# Patient Record
Sex: Female | Born: 1940 | Race: Black or African American | Hispanic: No | Marital: Married | Smoking: Never smoker
Health system: Southern US, Community
[De-identification: ages and names within clinical notes are randomized; demographics above are authoritative.]

## PROBLEM LIST (undated history)

## (undated) DIAGNOSIS — Z9889 Other specified postprocedural states: Secondary | ICD-10-CM

## (undated) DIAGNOSIS — E119 Type 2 diabetes mellitus without complications: Secondary | ICD-10-CM

## (undated) DIAGNOSIS — G43909 Migraine, unspecified, not intractable, without status migrainosus: Secondary | ICD-10-CM

## (undated) DIAGNOSIS — M1990 Unspecified osteoarthritis, unspecified site: Secondary | ICD-10-CM

## (undated) DIAGNOSIS — F0390 Unspecified dementia without behavioral disturbance: Secondary | ICD-10-CM

## (undated) DIAGNOSIS — R0602 Shortness of breath: Secondary | ICD-10-CM

## (undated) DIAGNOSIS — I10 Essential (primary) hypertension: Secondary | ICD-10-CM

## (undated) DIAGNOSIS — I2510 Atherosclerotic heart disease of native coronary artery without angina pectoris: Secondary | ICD-10-CM

## (undated) DIAGNOSIS — H9190 Unspecified hearing loss, unspecified ear: Secondary | ICD-10-CM

## (undated) DIAGNOSIS — I219 Acute myocardial infarction, unspecified: Secondary | ICD-10-CM

## (undated) DIAGNOSIS — R413 Other amnesia: Secondary | ICD-10-CM

## (undated) DIAGNOSIS — D509 Iron deficiency anemia, unspecified: Secondary | ICD-10-CM

## (undated) DIAGNOSIS — R112 Nausea with vomiting, unspecified: Secondary | ICD-10-CM

## (undated) DIAGNOSIS — E785 Hyperlipidemia, unspecified: Secondary | ICD-10-CM

## (undated) HISTORY — PX: TUBAL LIGATION: SHX77

## (undated) HISTORY — DX: Hyperlipidemia, unspecified: E78.5

## (undated) HISTORY — DX: Atherosclerotic heart disease of native coronary artery without angina pectoris: I25.10

## (undated) HISTORY — DX: Unspecified hearing loss, unspecified ear: H91.90

## (undated) HISTORY — PX: BREAST BIOPSY: SHX20

## (undated) HISTORY — PX: REDUCTION MAMMAPLASTY: SUR839

## (undated) HISTORY — DX: Type 2 diabetes mellitus without complications: E11.9

## (undated) HISTORY — DX: Essential (primary) hypertension: I10

## (undated) HISTORY — DX: Other amnesia: R41.3

## (undated) HISTORY — PX: SHOULDER HEMI-ARTHROPLASTY: SHX5049

---
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
## 1996-02-24 DIAGNOSIS — I2510 Atherosclerotic heart disease of native coronary artery without angina pectoris: Secondary | ICD-10-CM

## 1996-02-24 DIAGNOSIS — I219 Acute myocardial infarction, unspecified: Secondary | ICD-10-CM

## 1996-02-24 HISTORY — DX: Acute myocardial infarction, unspecified: I21.9

## 1996-02-24 HISTORY — DX: Atherosclerotic heart disease of native coronary artery without angina pectoris: I25.10

## 1996-02-24 HISTORY — PX: CORONARY ANGIOPLASTY WITH STENT PLACEMENT: SHX49

## 1998-08-08 ENCOUNTER — Other Ambulatory Visit: Admission: RE | Admit: 1998-08-08 | Discharge: 1998-08-08 | Payer: Self-pay | Admitting: Gynecology

## 1999-07-01 ENCOUNTER — Other Ambulatory Visit: Admission: RE | Admit: 1999-07-01 | Discharge: 1999-07-01 | Payer: Self-pay | Admitting: Gynecology

## 2000-06-25 ENCOUNTER — Other Ambulatory Visit: Admission: RE | Admit: 2000-06-25 | Discharge: 2000-06-25 | Payer: Self-pay | Admitting: Gynecology

## 2001-07-29 ENCOUNTER — Other Ambulatory Visit: Admission: RE | Admit: 2001-07-29 | Discharge: 2001-07-29 | Payer: Self-pay | Admitting: Gynecology

## 2002-08-03 ENCOUNTER — Other Ambulatory Visit: Admission: RE | Admit: 2002-08-03 | Discharge: 2002-08-03 | Payer: Self-pay | Admitting: Gynecology

## 2003-05-24 ENCOUNTER — Ambulatory Visit (HOSPITAL_COMMUNITY): Admission: RE | Admit: 2003-05-24 | Discharge: 2003-05-24 | Payer: Self-pay | Admitting: Cardiology

## 2003-08-10 ENCOUNTER — Other Ambulatory Visit: Admission: RE | Admit: 2003-08-10 | Discharge: 2003-08-10 | Payer: Self-pay | Admitting: Gynecology

## 2003-08-29 ENCOUNTER — Ambulatory Visit (HOSPITAL_COMMUNITY): Admission: RE | Admit: 2003-08-29 | Discharge: 2003-08-29 | Payer: Self-pay | Admitting: Endocrinology

## 2003-10-22 ENCOUNTER — Encounter (INDEPENDENT_AMBULATORY_CARE_PROVIDER_SITE_OTHER): Payer: Self-pay | Admitting: Specialist

## 2003-10-22 ENCOUNTER — Ambulatory Visit (HOSPITAL_BASED_OUTPATIENT_CLINIC_OR_DEPARTMENT_OTHER): Admission: RE | Admit: 2003-10-22 | Discharge: 2003-10-22 | Payer: Self-pay | Admitting: General Surgery

## 2003-10-22 ENCOUNTER — Ambulatory Visit (HOSPITAL_COMMUNITY): Admission: RE | Admit: 2003-10-22 | Discharge: 2003-10-22 | Payer: Self-pay | Admitting: General Surgery

## 2004-08-15 ENCOUNTER — Other Ambulatory Visit: Admission: RE | Admit: 2004-08-15 | Discharge: 2004-08-15 | Payer: Self-pay | Admitting: Gynecology

## 2006-08-23 ENCOUNTER — Other Ambulatory Visit: Admission: RE | Admit: 2006-08-23 | Discharge: 2006-08-23 | Payer: Self-pay | Admitting: Gynecology

## 2007-07-25 ENCOUNTER — Ambulatory Visit (HOSPITAL_BASED_OUTPATIENT_CLINIC_OR_DEPARTMENT_OTHER): Admission: RE | Admit: 2007-07-25 | Discharge: 2007-07-25 | Payer: Self-pay | Admitting: Orthopedic Surgery

## 2007-07-25 HISTORY — PX: SHOULDER ARTHROSCOPY: SHX128

## 2010-06-23 ENCOUNTER — Telehealth: Payer: Self-pay | Admitting: Cardiology

## 2010-06-23 NOTE — Telephone Encounter (Signed)
Spoke with Ashlee [name], she is concerned because she has had two episodes of a sharp, stabbing pain in her back. She denies SOB or other symptoms. She reports the same type pain as when she had her heart attack but this pain is not as bad. Both of these episodes occurred when she was resting. She does note that she has been working on the computer more than usual and has some back discomfort while working. She also has an area on her back that is tender to the touch. She denies any arm, neck or chest discomfort. She is fine today with no symptoms and feels fine. She requested to be seen sooner than Friday. Ashlee [name] will see dr Jens Som tomorrow at 10:30am. She will go to the ER with any back discomfort that is prolonged prior to appt tomorrow [name]

## 2010-06-23 NOTE — Telephone Encounter (Signed)
Pt having pain in back off and on last only a second but it's a sharp pain started having them  Saturday, none today, feels fine, has appt 06-27-10, wants to know what to do?

## 2010-06-24 ENCOUNTER — Encounter: Payer: Self-pay | Admitting: Cardiology

## 2010-06-24 ENCOUNTER — Ambulatory Visit (INDEPENDENT_AMBULATORY_CARE_PROVIDER_SITE_OTHER): Payer: Medicare Other | Admitting: Cardiology

## 2010-06-24 DIAGNOSIS — R011 Cardiac murmur, unspecified: Secondary | ICD-10-CM

## 2010-06-24 DIAGNOSIS — I10 Essential (primary) hypertension: Secondary | ICD-10-CM | POA: Insufficient documentation

## 2010-06-24 DIAGNOSIS — R079 Chest pain, unspecified: Secondary | ICD-10-CM | POA: Insufficient documentation

## 2010-06-24 DIAGNOSIS — I2510 Atherosclerotic heart disease of native coronary artery without angina pectoris: Secondary | ICD-10-CM

## 2010-06-24 DIAGNOSIS — R0989 Other specified symptoms and signs involving the circulatory and respiratory systems: Secondary | ICD-10-CM | POA: Insufficient documentation

## 2010-06-24 DIAGNOSIS — E785 Hyperlipidemia, unspecified: Secondary | ICD-10-CM

## 2010-06-24 NOTE — Assessment & Plan Note (Signed)
Continue aspirin and statin. 

## 2010-06-24 NOTE — Progress Notes (Signed)
HPI: 70 yo female for evaluation of CAD. Previously followed by Dr. Aleen Campi. Pt had PCI of LAD in 1998 following MI. 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 Patient describes recent episodes of back pain. It is in the left back area. It does not radiate. It lasts seconds and resolves spontaneously. There are no associated symptoms. She also has dyspnea on exertion but no orthopnea, PND, pedal edema or exertional chest pain. She does state with her previous MI she had back pain. Note her back pain is not exertional.  Current Outpatient Prescriptions  Medication Sig Dispense Refill  . amLODipine (NORVASC) 5 MG tablet Take 5 mg by mouth daily.        Marland Kitchen aspirin 81 MG tablet Take 81 mg by mouth daily.        Marland Kitchen atorvastatin (LIPITOR) 40 MG tablet Take 40 mg by mouth daily.        . Exenatide (BYETTA 10 MCG PEN [place]) Inject into the skin.        Marland Kitchen glimepiride (AMARYL) 1 MG tablet Take 1 mg by mouth daily before breakfast.        . hydrochlorothiazide (,MICROZIDE/HYDRODIURIL,) 12.5 MG capsule 12.5 mg. Take 1/2 tablet daily        . insulin NPH (HUMULIN N,NOVOLIN N) 100 UNIT/ML injection Inject into the skin.        . metFORMIN (GLUCOPHAGE) 500 MG tablet Take 500 mg by mouth 2 (two) times daily with a meal.        . Multiple Vitamin (MULTIVITAMIN) capsule Take 1 capsule by mouth daily.        Marland Kitchen olmesartan (BENICAR) 5 MG tablet Take 5 mg by mouth daily.        . pioglitazone (ACTOS) 30 MG tablet Take 30 mg by mouth daily.        . vitamin B-12 (CYANOCOBALAMIN) 100 MCG tablet Take 50 mcg by mouth daily.          Allergies  Allergen Reactions  . Demerol   . Percodan (Oxycodone-Aspirin)     Past Medical History  Diagnosis Date  . CAD (coronary artery disease) 1998    stent post heart attack  . DM type 2 (diabetes mellitus, type 2)   . Hypertension   . Hyperlipidemia     Past Surgical History  Procedure Date    . Breast reduction surgery   . Tubal ligation   . Lad stent     post heart attack  . Tonsillectomy     History   Social History  . Marital Status: Married    Spouse Name: N/A    Number of Children: N/A  . Years of Education: N/A   Occupational History  . Not on file.   Social History Main Topics  . Smoking status: Never Smoker   . Smokeless tobacco: Not on file  . Alcohol Use: Not on file  . Drug Use: Not on file  . Sexually Active: Not on file   Other Topics Concern  . Not on file   Social History Narrative  . No narrative on file    Family History  Problem Relation Age of Onset  . Heart attack Mother     MI at age 69  . Breast cancer Sister   . Throat cancer Brother   . Lung cancer Brother   . Prostate cancer Father   . Hypertension Mother  ROS: Arthralgias but no fevers or chills, productive cough, hemoptysis, dysphasia, odynophagia, melena, hematochezia, dysuria, hematuria, rash, seizure activity, orthopnea, PND, pedal edema, claudication. Remaining systems are negative.  Physical Exam: General:  Well developed/obese in NAD Skin warm/dry Patient not depressed No peripheral clubbing Back-normal HEENT-normal/normal eyelids Neck supple/normal carotid upstroke bilaterally; left carotid bruit; no JVD; no thyromegaly chest - CTA/ normal expansion CV - RRR/normal S1 and S2; no rubs or gallops;  PMI nondisplaced; 2/6 systolic murmur left sternal border. S2 is not diminished. Abdomen -NT/ND, no HSM, no mass, + bowel sounds, no bruit 2+ femoral pulses, no bruits Ext-no edema, chords, 2+ DP Neuro-grossly nonfocal  ECG Sinus rhythm at a rate of 72. First degree AV block. No ST changes. Low voltage.

## 2010-06-24 NOTE — Assessment & Plan Note (Signed)
Continue statin. Lipids and liver monitored by primary care. 

## 2010-06-24 NOTE — Assessment & Plan Note (Signed)
Left carotid bruit noted. However this was documented previously and previous ultrasound showed no obstruction.

## 2010-06-24 NOTE — Assessment & Plan Note (Signed)
Schedule echocardiogram to exclude mild aortic stenosis.

## 2010-06-24 NOTE — Assessment & Plan Note (Signed)
Blood pressure controlled on present medications. Will continue. Potassium and renal function monitored by primary care. 

## 2010-06-24 NOTE — Assessment & Plan Note (Signed)
Symptoms atypical but multiple risk factors and question whether this is similar to previous symptoms prior to infarct. Plan proceed with Myoview for risk stratification.

## 2010-06-24 NOTE — Patient Instructions (Signed)
Your physician recommends that you schedule a follow-up appointment in: 6 months. The office will mail you a reminder letter 2 months prior appointment date. Your physician has requested that you have an echocardiogram. Echocardiography is a painless test that uses sound waves to create images of your heart. It provides your doctor with information about the size and shape of your heart and how well your heart's chambers and valves are working. This procedure takes approximately one hour. There are no restrictions for this procedure. Your physician has requested that you have a lexiscan myoview. For further information please visit [email or web address] Please follow instruction sheet, as given.

## 2010-06-27 ENCOUNTER — Ambulatory Visit: Payer: Self-pay | Admitting: Cardiology

## 2010-07-08 NOTE — Op Note (Signed)
NAME:  Ashlee [name], Ashlee [name]               ACCOUNT NO.:  [id number]   MEDICAL RECORD NO.:  [id number]          PATIENT TYPE:  AMB   LOCATION:  DSC                          FACILITY:  [facility]   PHYSICIAN:  Robert A. Thurston Hole, M.D. DATE OF BIRTH:  1940-07-27   DATE OF PROCEDURE:  DATE OF DISCHARGE:                               OPERATIVE REPORT   PREOPERATIVE DIAGNOSES:  1. Right shoulder irreparable rotator cuff tear.  2. Right shoulder high-grade partial biceps tendon tear.  3. Right shoulder spurring.  4. Right shoulder partial labrum tear.   POSTOPERATIVE DIAGNOSES:  1. Right shoulder irreparable rotator cuff tear.  2. Right shoulder high-grade partial biceps tendon tear.  3. Right shoulder spurring.  4. Right shoulder partial labrum tear.   PROCEDURE:  1. Right shoulder exam under anesthesia followed by arthroscopic      debridement irreparable rotator cuff tear.  2. Right shoulder biceps tenotomy.  3. Right shoulder partial labral tear debridement.  4. Right shoulder spur excision.   SURGEON:  Elana Alm. Thurston Hole, MD   ASSISTANT:  Julien Girt, PA   ANESTHESIA:  General.   OPERATIVE TIME:  45 minutes.   COMPLICATIONS:  None.   INDICATION FOR PROCEDURE:  [name] is a 70 year old woman who has had  longstanding increasing right shoulder pain over the past 1-2 years,  with the exam and MRI showing a large retracted rotator cuff tear with a  high-grade partial biceps tendon tear with labral tear with spurring  subacromially.  She has failed conservative care and is now to undergo  arthroscopy.   DESCRIPTION:  [name] was brought to the operating room on July 25, 2007, after an interscalene block was placed in the room by anesthesia.  She was placed on the operating table in supine position.  After being  placed under general anesthesia, the right shoulder was examined.  Initial range of motion showed forward flexion to 170, abduction to 170,  and internal external  rotation to 60 degrees.  A gentle manipulation was  carried out breaking up soft adhesions and improving range of motion to  full range of motion.  The shoulder remained stable to ligamentous exam.  She was then placed in a beach-chair position and her shoulder and arm  was prepped using sterile DuraPrep and draped using sterile technique.  Originally, through a posterior arthroscopic portal, the arthroscope  with a pump attached was placed into an anterior portal and arthroscopic  probe was placed.  On initial inspection, the articular cartilage and  the glenohumeral joint showed 30% grade 3 and the retrograde 1-2  chondromalacia, which was debrided.  She has partial tearing in the  anterior, superior, posterior, and inferior labrum 25-30%, which was  debrided.  The anterior-inferior glenohumeral ligament complex was  intact.  The biceps tendon anchor was intact, but the biceps tendon had  a high-grade 80-90% partial tear, and this was felt to be amenable to  biceps tenotomy, and this was performed, and the intraarticular portion  of the biceps along with the stump was removed.  A rotator cuff showed a  large amount of retraction with irreparable supraspinatus and  infraspinatus with retraction medial to the glenoid rim.  This was not  reparable and thus this was partially debrided.  The rest of the rotator  cuff was intact.  There was a large thickened bursa sac over this area,  over the bald head, and this was partially debrided, but partially left  intact.  A subacromial decompression was not performed because of the  irreparable rotator cuff tear, but there was a large inferior spur on  the acromion, which was removed.  The [facility] joint was not resected.  At  this point, the shoulder could be brought through a full range of  motion.  At this point, it was felt that all pathology have been  satisfactorily addressed.  The instruments were removed.  The portals  were closed with 3-0 nylon  sutures.  Sterile dressing and a sling  applied, and the patient awakened and taken to the recovery room in  stable condition.   FOLLOWUP CARE:  Ms. Dobbin will be followed up as an outpatient on  Percocet and Robaxin.  She will be seen back in the office in a week for  sutures out and followup.      Robert A. Thurston Hole, M.D.  Electronically Signed     RAW/MEDQ  D:  07/25/2007  T:  07/26/2007  Job:  [id number]

## 2010-07-10 ENCOUNTER — Ambulatory Visit (HOSPITAL_COMMUNITY): Payer: Medicare Other | Attending: Cardiology | Admitting: Radiology

## 2010-07-10 DIAGNOSIS — R011 Cardiac murmur, unspecified: Secondary | ICD-10-CM | POA: Insufficient documentation

## 2010-07-10 DIAGNOSIS — I2510 Atherosclerotic heart disease of native coronary artery without angina pectoris: Secondary | ICD-10-CM

## 2010-07-10 DIAGNOSIS — I079 Rheumatic tricuspid valve disease, unspecified: Secondary | ICD-10-CM | POA: Insufficient documentation

## 2010-07-10 DIAGNOSIS — I359 Nonrheumatic aortic valve disorder, unspecified: Secondary | ICD-10-CM | POA: Insufficient documentation

## 2010-07-10 DIAGNOSIS — R072 Precordial pain: Secondary | ICD-10-CM | POA: Insufficient documentation

## 2010-07-10 DIAGNOSIS — R079 Chest pain, unspecified: Secondary | ICD-10-CM | POA: Insufficient documentation

## 2010-07-10 MED ORDER — REGADENOSON 0.4 MG/5ML IV SOLN
0.4000 mg | Freq: Once | INTRAVENOUS | Status: AC
  Administered 2010-07-10: 0.4 mg via INTRAVENOUS

## 2010-07-10 MED ORDER — TECHNETIUM TC 99M TETROFOSMIN IV KIT
33.0000 | PACK | Freq: Once | INTRAVENOUS | Status: AC | PRN
  Administered 2010-07-10: 33 via INTRAVENOUS

## 2010-07-10 MED ORDER — TECHNETIUM TC 99M TETROFOSMIN IV KIT
11.0000 | PACK | Freq: Once | INTRAVENOUS | Status: AC | PRN
  Administered 2010-07-10: 11 via INTRAVENOUS

## 2010-07-10 NOTE — Progress Notes (Signed)
[facility] SITE 3 NUCLEAR MED [address] [place] [address] [id number]  Cardiology Nuclear Med Study  ROYELLE HINCHMAN is a 70 y.o. female [id number] 1940/06/18   Nuclear Med Background Indication for Stress Test:  Evaluation for Ischemia History:  1998- MI treated with Stent-LAD. 2009- [facility]PS- Anterior scar. No ischemia. EF= 50% Cardiac Risk Factors: Carotid Disease, Family History - CAD, Hypertension, IDDM Type 2 and Lipids  Symptoms:  Back Pain( similar to symptoms with MI)   Nuclear Pre-Procedure Caffeine/Decaff Intake:  None NPO After: 7:30pm   Lungs:  Clear IV 0.9% NS with Angio Cath:  20g  IV Site: R Antecubital  IV Started by:  Stanton Kidney, EMT-P  Chest Size (in):  42 Cup Size: C  Height: 5\' 3"  (1.6 m)  Weight:  228 lb (103.42 kg)  BMI:  Body mass index is 40.39 kg/(m^2). Tech Comments: CBG = 102 @ 7:30 am,per patient    Nuclear Med Study 1 or 2 day study: 1 day  Stress Test Type:  Lexiscan  Reading MD: Willa Rough, MD  Order Authorizing Provider:  Ripley Fraise  Resting Radionuclide: Technetium 39m Tetrofosmin  Resting Radionuclide Dose: 11 mCi   Stress Radionuclide:  Technetium 27m Tetrofosmin  Stress Radionuclide Dose: 33 mCi           Stress Protocol Rest HR: 57 Stress HR: 98  Rest BP: 132/49 Stress BP: 114/37  Exercise Time (min): n/a METS: n/a   Predicted Max HR: 149 bpm % Max HR: 65.77 bpm Rate Pressure Product: 32440   Dose of Adenosine (mg):  n/a Dose of Lexiscan: 0.4 mg  Dose of Atropine (mg): n/a Dose of Dobutamine: n/a mcg/kg/min (at max HR)  Stress Test Technologist: Milana Na, EMT-P  Nuclear Technologist:  Domenic Polite, CNMT     Rest Procedure:  Myocardial perfusion imaging was performed at rest 45 minutes following the intravenous administration of Technetium 29m Tetrofosmin. Rest ECG: NSR  Stress Procedure:  The patient received IV Lexiscan 0.4 mg over 15-seconds.  Technetium 77m Tetrofosmin injected  at 30-seconds.  There were no significant changes with Lexiscan. Pt. experienced back pain, SOB, and nausea. Quantitative spect images were obtained after a 45 minute delay. Stress ECG: No significant ST segment change suggestive of ischemia.  QPS Raw Data Images:  Normal; no motion artifact; normal heart/lung ratio. Stress Images:  Marked decrease in activity in the anterior wall and the apex. Rest Images:  Same as stress Subtraction (SDS):  No evidence of ischemia. Transient Ischemic Dilatation (Normal <1.22):  .99 Lung/Heart Ratio (Normal <0.45):  .25  Quantitative Gated Spect Images QGS EDV:  135 ml QGS ESV:  73 ml QGS cine images:  Decreased motion of the anterior wall, apex, and septum. QGS EF: 46%  Impression Exercise Capacity:  Lexiscan with no exercise. BP Response:  Normal blood pressure response. Clinical Symptoms:  SOB ECG Impression:  No significant ST segment change suggestive of ischemia. Comparison with Prior Nuclear Study: No images to compare  Overall Impression:  Abnormal stress nuclear study. There is a large, dense scar affecting the anterior wall and the apex. There is no significant ischemia.   Willa Rough

## 2010-07-11 NOTE — Op Note (Signed)
NAME:  Ashlee [name], Ashlee [name]                         ACCOUNT NO.:  [id number]   MEDICAL RECORD NO.:  [id number]                   PATIENT TYPE:  AMB   LOCATION:  DSC                                  FACILITY:  [facility]   PHYSICIAN:  Leonie Man, M.D.                DATE OF BIRTH:  Oct 12, 1940   DATE OF PROCEDURE:  10/22/2003  DATE OF DISCHARGE:                                 OPERATIVE REPORT   PREOPERATIVE DIAGNOSIS:  Sebaceous cyst right breast.   POSTOPERATIVE DIAGNOSIS:  Sebaceous cyst right breast.   PROCEDURE:  Excision of sebaceous cyst right breast.   SURGEON:  Leonie Man, M.D.   ASSISTANT:  Nurse.   ANESTHESIA:  MAC.  I used 0.5% Marcaine with epinephrine.   INDICATIONS FOR PROCEDURE:  Ms. Mcelwain is a 70 year old woman who on  mammogram, was noted to have a lesion of the right breast.  This, however,  turned out to be a skin lesion. On evaluation, she has a sebaceous cyst of  the medial aspect of the right breast.  She comes to the operating room now  for excision of this mass.  The risks and potential benefits of surgery have  been discussed.  All questions answered and consent obtained.   DESCRIPTION OF PROCEDURE:  Following satisfactory sedation with the patient  positioned supinely, area of the right breast is prepped and draped to be  included in the sterile operative field.  The region around the sebaceous  cyst was infiltrated with 0.5% Marcaine with epinephrine and elliptical  incision carried down through the skin with excision of the entire cyst into  the subcutaneous tissues.  The entire cyst was removed and forwarded for  pathologic evaluation.  Hemostasis obtained with electrocautery.  The  subcutaneous tissues are reapproximated using interrupted 0 Vicryl suture.  The skin was closed with 5-0 Monocryl suture and then reinforced with Steri-  Strips.  Sterile dressings applied.  Anesthetic reversed.  The patient  removed from the operating room to the recovery  room in stable condition.  The patient tolerated the procedure well.                                               Leonie Man, M.D.    PB/MEDQ  D:  10/22/2003  T:  10/22/2003  Job:  [id number]

## 2010-07-14 NOTE — Progress Notes (Signed)
NUCLEAR STUDY ROUTED TO DR. CRENSHAW. 

## 2010-07-16 NOTE — Progress Notes (Signed)
Patient is aware of test/lab results.  

## 2010-07-31 ENCOUNTER — Encounter: Payer: Self-pay | Admitting: Cardiology

## 2010-09-10 ENCOUNTER — Other Ambulatory Visit: Payer: Self-pay | Admitting: Gynecology

## 2010-11-19 LAB — BASIC METABOLIC PANEL
Chloride: 105
GFR calc Af Amer: >60
GFR calc non Af Amer: >60
Glucose, Bld: 107 — ABNORMAL HIGH
Sodium: 138

## 2010-11-20 LAB — POCT HEMOGLOBIN-HEMACUE: Hemoglobin: 10.6 — ABNORMAL LOW

## 2010-12-23 ENCOUNTER — Encounter: Payer: Self-pay | Admitting: Cardiology

## 2010-12-23 ENCOUNTER — Ambulatory Visit (INDEPENDENT_AMBULATORY_CARE_PROVIDER_SITE_OTHER): Payer: Medicare Other | Admitting: Cardiology

## 2010-12-23 VITALS — BP 142/72 | HR 73 | Ht 63.0 in | Wt 226.1 lb

## 2010-12-23 DIAGNOSIS — I2510 Atherosclerotic heart disease of native coronary artery without angina pectoris: Secondary | ICD-10-CM

## 2010-12-23 DIAGNOSIS — E785 Hyperlipidemia, unspecified: Secondary | ICD-10-CM

## 2010-12-23 DIAGNOSIS — I10 Essential (primary) hypertension: Secondary | ICD-10-CM

## 2010-12-23 DIAGNOSIS — R0602 Shortness of breath: Secondary | ICD-10-CM

## 2010-12-23 NOTE — Patient Instructions (Addendum)
Your physician wants you to follow-up in: 1 YEAR TO SEE DR. CRENSHAW. You will receive a reminder letter in the mail two months in advance. If you don't receive a letter, please call our office to schedule the follow-up appointment.

## 2010-12-23 NOTE — Assessment & Plan Note (Signed)
Continue statin. Lipids and liver monitored by primary care. 

## 2010-12-23 NOTE — Progress Notes (Signed)
[id number] female for fu of CAD. Previously followed by Dr. Aleen Campi. Pt had PCI of LAD in 1998 following MI. Last myoview 5/12 revealed anterior MI, no ischemia; EF 46. Echo in May of 2012 showed EF 45-50, mild LAE and mild TR. Carotid dopplers 5/08 revealed tortuosity but no obstructive disease. Since I last saw her in May of 2012, the patient has dyspnea with more extreme activities but not with routine activities. It is relieved with rest. It is not associated with chest pain. There is no orthopnea, PND or pedal edema. There is no syncope or palpitations. There is no exertional chest pain.    Current Outpatient Prescriptions  Medication Sig Dispense Refill  . ACCU-CHEK AVIVA PLUS test strip       . amLODipine (NORVASC) 5 MG tablet Take 5 mg by mouth daily.        Marland Kitchen aspirin 81 MG tablet Take 81 mg by mouth daily.        Marland Kitchen atorvastatin (LIPITOR) 40 MG tablet Take 40 mg by mouth daily.        . AZASITE 1 % ophthalmic solution       . doxycycline (VIBRAMYCIN) 100 MG capsule       . ergocalciferol (VITAMIN D2) 50000 UNITS capsule       . Exenatide (BYETTA 10 MCG PEN ) Inject into the skin.        Marland Kitchen glimepiride (AMARYL) 1 MG tablet Take 2 mg by mouth daily before breakfast.       . hydrochlorothiazide (,MICROZIDE/HYDRODIURIL,) 12.5 MG capsule 12.5 mg. Take 1/2 tablet daily        . insulin NPH (HUMULIN N,NOVOLIN N) 100 UNIT/ML injection Inject into the skin.        . metFORMIN (GLUCOPHAGE) 500 MG tablet Take 1,000 mg by mouth 2 (two) times daily with a meal.       . Multiple Vitamin (MULTIVITAMIN) capsule Take 1 capsule by mouth daily.        Marland Kitchen olmesartan (BENICAR) 5 MG tablet Take 20 mg by mouth daily.       . pioglitazone (ACTOS) 30 MG tablet Take 30 mg by mouth daily.        . vitamin B-12 (CYANOCOBALAMIN) 100 MCG tablet Take 50 mcg by mouth daily.           Past Medical History  Diagnosis Date  . CAD (coronary artery disease) 1998    stent post heart attack  . DM type 2 (diabetes  mellitus, type 2)   . Hypertension   . Hyperlipidemia     Past Surgical History  Procedure Date  . Breast reduction surgery   . Tubal ligation   . Lad stent     post heart attack  . Tonsillectomy     History   Social History  . Marital Status: Married    Spouse Name: N/A    Number of Children: N/A  . Years of Education: N/A   Occupational History  . Not on file.   Social History Main Topics  . Smoking status: Never Smoker   . Smokeless tobacco: Not on file  . Alcohol Use: Not on file  . Drug Use: Not on file  . Sexually Active: Not on file   Other Topics Concern  . Not on file   Social History Narrative  . No narrative on file    ROS: no fevers or chills, productive cough, hemoptysis, dysphasia, odynophagia, melena, hematochezia, dysuria, hematuria, rash, seizure activity, orthopnea,  PND, pedal edema, claudication. Remaining systems are negative.  Physical Exam: Well-developed well-nourished in no acute distress.  Skin is warm and dry.  HEENT is normal.  Neck is supple. No thyromegaly.  Chest is clear to auscultation with normal expansion.  Cardiovascular exam is regular rate and rhythm.  Abdominal exam nontender or distended. No masses palpated. Extremities show no edema. neuro grossly intact  ECG NSR, anterior MI, no significant Twave changes

## 2010-12-23 NOTE — Assessment & Plan Note (Signed)
Recent Myoview showed no ischemia. Continue medical therapy. Continue risk factor modification.

## 2010-12-23 NOTE — Assessment & Plan Note (Signed)
Blood pressure controlled. Continue present medications. Potassium and renal function monitored by primary care. 

## 2011-06-24 ENCOUNTER — Other Ambulatory Visit: Payer: Self-pay | Admitting: Gastroenterology

## 2012-04-01 ENCOUNTER — Telehealth: Payer: Self-pay | Admitting: Cardiology

## 2012-04-01 NOTE — Telephone Encounter (Signed)
New Problem: ° ° ° °I called the patient and was unable to reach them. I left a message on their voicemail with my name, the reason I called, the name of their physician, and a number to call back to schedule their appointment. ° °

## 2012-05-23 ENCOUNTER — Encounter: Payer: Self-pay | Admitting: Cardiology

## 2012-05-23 ENCOUNTER — Ambulatory Visit (INDEPENDENT_AMBULATORY_CARE_PROVIDER_SITE_OTHER): Payer: Medicare Other | Admitting: Cardiology

## 2012-05-23 VITALS — BP 120/80 | HR 71 | Wt 227.0 lb

## 2012-05-23 DIAGNOSIS — I10 Essential (primary) hypertension: Secondary | ICD-10-CM

## 2012-05-23 DIAGNOSIS — E785 Hyperlipidemia, unspecified: Secondary | ICD-10-CM

## 2012-05-23 DIAGNOSIS — I2510 Atherosclerotic heart disease of native coronary artery without angina pectoris: Secondary | ICD-10-CM

## 2012-05-23 NOTE — Assessment & Plan Note (Signed)
Continue statin. Lipids and liver monitored by primary care. 

## 2012-05-23 NOTE — Assessment & Plan Note (Signed)
Continue aspirin and statin. Electrocardiogram shows increased T-wave inversion in the inferior lateral leads. She also has dyspnea and 30 years of diabetes mellitus. Will arrange myoview for risk stratification.

## 2012-05-23 NOTE — Patient Instructions (Addendum)
Lexiscan Myoview follow instructions given.    Your physician wants you to follow-up in: 1 year. You will receive a reminder letter in the mail two months in advance. If you don't receive a letter, please call our office to schedule the follow-up appointment.

## 2012-05-23 NOTE — Assessment & Plan Note (Signed)
Blood pressure controlled. Continue present medications. 

## 2012-05-23 NOTE — Progress Notes (Signed)
HPI: Pleasant female for fu of CAD. Pt had PCI of LAD in 1998 following MI. Last myoview 5/12 revealed anterior MI, no ischemia; EF 46. Echo in May of 2012 showed EF 45-50, mild LAE and mild TR. Carotid dopplers 5/08 revealed tortuosity but no obstructive disease. Since I last saw her in Oct of 2012, the patient has dyspnea with more extreme activities but not with routine activities. It is relieved with rest. It is not associated with chest pain. There is no orthopnea, PND or pedal edema. There is no syncope or palpitations. There is no exertional chest pain.   Current Outpatient Prescriptions  Medication Sig Dispense Refill  . ACCU-CHEK AVIVA PLUS test strip       . amLODipine (NORVASC) 5 MG tablet Take 5 mg by mouth daily.        Marland Kitchen aspirin 81 MG tablet Take 81 mg by mouth daily.        Marland Kitchen atorvastatin (LIPITOR) 80 MG tablet Take 40 mg by mouth daily.      . AZASITE 1 % ophthalmic solution Place 1 drop into both eyes as directed.       . ergocalciferol (VITAMIN D2) 50000 UNITS capsule Take 50,000 Units by mouth once a week.       Marland Kitchen exenatide (BYETTA 10 MCG PEN) 10 MCG/0.04ML SOLN bid      . Ferrous Sulfate (IRON) 325 (65 FE) MG TABS Take 1 tablet by mouth daily.      Marland Kitchen glimepiride (AMARYL) 4 MG tablet Take 2 mg by mouth daily before breakfast.      . hydrochlorothiazide (HYDRODIURIL) 25 MG tablet Take 12.5 mg by mouth daily.      . insulin NPH (HUMULIN N,NOVOLIN N) 100 UNIT/ML injection Inject into the skin as directed.       . metFORMIN (GLUCOPHAGE) 1000 MG tablet Take 1,000 mg by mouth 2 (two) times daily with a meal.      . Multiple Vitamin (MULTIVITAMIN) capsule Take 1 capsule by mouth daily.        . Olmesartan Medoxomil (BENICAR PO) Take 1 tablet by mouth daily.      . pioglitazone (ACTOS) 30 MG tablet Take 30 mg by mouth daily.        . vitamin B-12 (CYANOCOBALAMIN) 1000 MCG tablet Take 1,000 mcg by mouth daily.       No current facility-administered medications for this visit.      Past Medical History  Diagnosis Date  . CAD (coronary artery disease) 1998    stent post heart attack  . DM type 2 (diabetes mellitus, type 2)   . Hypertension   . Hyperlipidemia     Past Surgical History  Procedure Laterality Date  . Breast reduction surgery    . Tubal ligation    . Lad stent      post heart attack  . Tonsillectomy      History   Social History  . Marital Status: Married    Spouse Name: N/A    Number of Children: N/A  . Years of Education: N/A   Occupational History  . Not on file.   Social History Main Topics  . Smoking status: Never Smoker   . Smokeless tobacco: Not on file  . Alcohol Use: Not on file  . Drug Use: Not on file  . Sexually Active: Not on file   Other Topics Concern  . Not on file   Social History Narrative  . No narrative on  file    ROS: knee arthralgias but no fevers or chills, productive cough, hemoptysis, dysphasia, odynophagia, melena, hematochezia, dysuria, hematuria, rash, seizure activity, orthopnea, PND, pedal edema, claudication. Remaining systems are negative.  Physical Exam: Well-developed well-nourished in no acute distress.  Skin is warm and dry.  HEENT is normal.  Neck is supple.  Chest is clear to auscultation with normal expansion.  Cardiovascular exam is regular rate and rhythm.  Abdominal exam nontender or distended. No masses palpated. Extremities show no edema. neuro grossly intact  ECG sinus rhythm at a rate of 71. First degree AV block. Inferior lateral T-wave changes. T-wave changes more prominent compared to previous.

## 2012-05-25 ENCOUNTER — Encounter (HOSPITAL_COMMUNITY): Payer: Medicare Other

## 2012-06-14 ENCOUNTER — Ambulatory Visit (HOSPITAL_COMMUNITY): Payer: Medicare Other | Attending: Cardiovascular Disease | Admitting: Radiology

## 2012-06-14 VITALS — BP 130/48 | Ht 63.5 in | Wt 222.0 lb

## 2012-06-14 DIAGNOSIS — E119 Type 2 diabetes mellitus without complications: Secondary | ICD-10-CM | POA: Insufficient documentation

## 2012-06-14 DIAGNOSIS — Z8249 Family history of ischemic heart disease and other diseases of the circulatory system: Secondary | ICD-10-CM | POA: Insufficient documentation

## 2012-06-14 DIAGNOSIS — I252 Old myocardial infarction: Secondary | ICD-10-CM | POA: Insufficient documentation

## 2012-06-14 DIAGNOSIS — R0989 Other specified symptoms and signs involving the circulatory and respiratory systems: Secondary | ICD-10-CM | POA: Insufficient documentation

## 2012-06-14 DIAGNOSIS — R9431 Abnormal electrocardiogram [ECG] [EKG]: Secondary | ICD-10-CM | POA: Insufficient documentation

## 2012-06-14 DIAGNOSIS — R0602 Shortness of breath: Secondary | ICD-10-CM

## 2012-06-14 DIAGNOSIS — I779 Disorder of arteries and arterioles, unspecified: Secondary | ICD-10-CM | POA: Insufficient documentation

## 2012-06-14 DIAGNOSIS — I10 Essential (primary) hypertension: Secondary | ICD-10-CM | POA: Insufficient documentation

## 2012-06-14 DIAGNOSIS — R0609 Other forms of dyspnea: Secondary | ICD-10-CM | POA: Insufficient documentation

## 2012-06-14 DIAGNOSIS — I2510 Atherosclerotic heart disease of native coronary artery without angina pectoris: Secondary | ICD-10-CM

## 2012-06-14 DIAGNOSIS — E785 Hyperlipidemia, unspecified: Secondary | ICD-10-CM | POA: Insufficient documentation

## 2012-06-14 DIAGNOSIS — Z794 Long term (current) use of insulin: Secondary | ICD-10-CM | POA: Insufficient documentation

## 2012-06-14 MED ORDER — TECHNETIUM TC 99M SESTAMIBI GENERIC - CARDIOLITE
10.8000 | Freq: Once | INTRAVENOUS | Status: AC | PRN
  Administered 2012-06-14: 11 via INTRAVENOUS

## 2012-06-14 MED ORDER — AMINOPHYLLINE 25 MG/ML IV SOLN
75.0000 mg | Freq: Once | INTRAVENOUS | Status: AC
  Administered 2012-06-14: 75 mg via INTRAVENOUS

## 2012-06-14 MED ORDER — TECHNETIUM TC 99M SESTAMIBI GENERIC - CARDIOLITE
33.0000 | Freq: Once | INTRAVENOUS | Status: AC | PRN
  Administered 2012-06-14: 33 via INTRAVENOUS

## 2012-06-14 MED ORDER — REGADENOSON 0.4 MG/5ML IV SOLN
0.4000 mg | Freq: Once | INTRAVENOUS | Status: AC
  Administered 2012-06-14: 0.4 mg via INTRAVENOUS

## 2012-06-14 NOTE — Progress Notes (Signed)
[facility] SITE 3 NUCLEAR MED [address] [place], [place] [address] [phone]    Cardiology Nuclear Med Study  Ashlee [name] is a 72 y.o. female     MRN : [id number]     DOB: 23-Jun-1940  Procedure Date: 06/14/2012  Nuclear Med Background Indication for Stress Test:  Evaluation for Ischemia, Stent Patency and Abnormal EKG History:  '98 MI-Stent-LAD  5/12 MPS: Abnormal AWMI (-) ischemia EF: 46%, ECHO: EF: 45-50% Cardiac Risk Factors: Carotid Disease, Family History - CAD, Hypertension, IDDM Type 2 and Lipids  Symptoms:  DOE   Nuclear Pre-Procedure Caffeine/Decaff Intake:  None NPO After: 7:00pm   Lungs:  clear O2 Sat: 100% on room air. IV 0.9% NS with Angio Cath:  22g  IV Site: R Antecubital  IV Started by:  Milana Na, EMT-P  Chest Size (in):  40 Cup Size: C  Height: 5' 3.5" (1.613 m)  Weight:  222 lb (100.699 kg)  BMI:  Body mass index is 38.7 kg/(m^2). Tech Comments: No Diabetic Rx this am  CBG 97 mg/dl. This patient was given Aminophylline 75 mg IV for symptoms from the Lexiscan injection. Symptoms resolved with the Aminophylline.    Nuclear Med Study 1 or 2 day study: 1 day  Stress Test Type:  Eugenie Birks  Reading MD: Kristeen Miss, MD  Order Authorizing Provider:  Olga Millers MD  Resting Radionuclide: Technetium 64m Sestamibi  Resting Radionuclide Dose: 10.8 mCi   Stress Radionuclide:  Technetium 70m Sestamibi  Stress Radionuclide Dose: 33.0 mCi           Stress Protocol Rest HR: 61 Stress HR: 82  Rest BP: 130/48 Stress BP: 122/63  Exercise Time (min): n/a METS: n/a   Predicted Max HR: 147 bpm % Max HR: 55.78 bpm Rate Pressure Product: 21308   Dose of Adenosine (mg):  n/a Dose of Lexiscan: 0.4 mg  Dose of Atropine (mg): n/a Dose of Dobutamine: n/a mcg/kg/min (at max HR)  Stress Test Technologist: Frederick Peers, EMT-P  Nuclear Technologist:  Domenic Polite, CNMT     Rest Procedure:  Myocardial perfusion imaging was performed at rest 45  minutes following the intravenous administration of Technetium 25m Sestamibi. Rest ECG: NSR,  previous anterior MI,  ST depression in the inferior leads.   Stress Procedure:  The patient received IV Lexiscan 0.4 mg over 15-seconds.  Technetium 34m Sestamibi injected at 30-seconds. This patient had nausea, abdominal pain, and a headache with the Lexiscan injection. Quantitative spect images were obtained after a 45 minute delay. Stress ECG: No significant change from baseline ECG  QPS Raw Data Images:  Normal; no motion artifact; normal heart/lung ratio. Stress Images:  There is a large, severe defect of the entire anterior septum and apex.   Rest Images:  There is a large, severe defect of the entire anterior septum and apex.  Subtraction (SDS):  No evidence of ischemia.  There is evidence of a previous anteriorseptal apical  MI.  There is no significant ischemia. Transient Ischemic Dilatation (Normal <1.22):  1.26 Lung/Heart Ratio (Normal <0.45):  0.31  Quantitative Gated Spect Images QGS EDV:  207 ml QGS ESV:  159 ml  Impression Exercise Capacity:  Lexiscan with no exercise. BP Response:  Normal blood pressure response. Clinical Symptoms:  No significant symptoms noted. ECG Impression:  No significant ST segment change suggestive of ischemia. Comparison with Prior Nuclear Study:  The large anteroseptal apical MI is unchanged.  The LV function has decreased from 46% to  23 %  Overall Impression:  High risk stress nuclear study.  She has had a previous anterioseptal  apical MI .  The LV has dilated and the LV function has decreased since the previous study 07/10/10  LV Ejection Fraction: 23%.  LV Wall Motion:  There is akinesis of the anterioseptal and severe hypokinesis of the remaining walls.    Vesta Mixer, Montez Hageman., MD, [facility] 06/14/2012, 6:24 PM Office - [phone] Pager [phone]

## 2012-06-17 ENCOUNTER — Encounter (HOSPITAL_COMMUNITY): Payer: Medicare Other

## 2012-06-17 ENCOUNTER — Telehealth: Payer: Self-pay | Admitting: Cardiology

## 2012-06-17 NOTE — Telephone Encounter (Signed)
Spoke with pt, aware of nuclear results. Follow up scheduled  

## 2012-06-17 NOTE — Telephone Encounter (Signed)
Left message for pt to call.

## 2012-06-17 NOTE — Telephone Encounter (Signed)
New problem ° ° °Pt returning your call. Please call pt. °

## 2012-06-17 NOTE — Telephone Encounter (Signed)
F/u      Pt returned your call: call cell if you can't reach her at home: [phone]

## 2012-06-30 ENCOUNTER — Ambulatory Visit (INDEPENDENT_AMBULATORY_CARE_PROVIDER_SITE_OTHER): Payer: Medicare Other | Admitting: Cardiology

## 2012-06-30 ENCOUNTER — Other Ambulatory Visit: Payer: Self-pay | Admitting: Cardiology

## 2012-06-30 ENCOUNTER — Encounter: Payer: Self-pay | Admitting: *Deleted

## 2012-06-30 ENCOUNTER — Encounter: Payer: Self-pay | Admitting: Cardiology

## 2012-06-30 VITALS — BP 121/64 | HR 76 | Wt 222.0 lb

## 2012-06-30 DIAGNOSIS — I255 Ischemic cardiomyopathy: Secondary | ICD-10-CM | POA: Insufficient documentation

## 2012-06-30 DIAGNOSIS — R9430 Abnormal result of cardiovascular function study, unspecified: Secondary | ICD-10-CM

## 2012-06-30 DIAGNOSIS — I25810 Atherosclerosis of coronary artery bypass graft(s) without angina pectoris: Secondary | ICD-10-CM

## 2012-06-30 LAB — CBC WITH DIFFERENTIAL/PLATELET
Basophils Relative: 0.5 % (ref 0.0–3.0)
Eosinophils Relative: 2 % (ref 0.0–5.0)
Hemoglobin: 11.1 g/dL — ABNORMAL LOW (ref 12.0–15.0)
Lymphs Abs: 1.5 10*3/uL (ref 0.7–4.0)
MCHC: 32.8 g/dL (ref 30.0–36.0)
Monocytes Absolute: 0.6 10*3/uL (ref 0.1–1.0)
Neutrophils Relative %: 52.7 % (ref 43.0–77.0)
Platelets: 237 10*3/uL (ref 150.0–400.0)
RDW: 16.6 % — ABNORMAL HIGH (ref 11.5–14.6)

## 2012-06-30 LAB — BASIC METABOLIC PANEL
CO2: 28 mEq/L (ref 19–32)
Calcium: 9.3 mg/dL (ref 8.4–10.5)
Creatinine, Ser: 0.9 mg/dL (ref 0.4–1.2)
GFR: 78.75 mL/min (ref >60.00)
Glucose, Bld: 108 mg/dL — ABNORMAL HIGH (ref 70–99)
Sodium: 138 mEq/L (ref 135–145)

## 2012-06-30 MED ORDER — CARVEDILOL 3.125 MG PO TABS: 3.1250 mg | ORAL_TABLET | Freq: Two times a day (BID) | ORAL | Status: DC

## 2012-06-30 NOTE — Progress Notes (Signed)
  HPI: Pleasant female for fu of CAD. Pt had PCI of LAD in 1998 following MI. Echo in May of 2012 showed EF 45-50, mild LAE and mild TR. Carotid dopplers 5/08 revealed tortuosity but no obstructive disease. When I saw her in March of 2014 we repeated her nuclear study. This showed anteroseptal and apical infarct. Her ejection fraction had decreased to 23%. Since I last saw her, the patient has dyspnea with more extreme activities but not with routine activities. It is relieved with rest. It is not associated with chest pain. There is no orthopnea, PND or pedal edema. There is no syncope or palpitations. There is no exertional chest pain.    Current Outpatient Prescriptions  Medication Sig Dispense Refill  . ACCU-CHEK AVIVA PLUS test strip       . amLODipine (NORVASC) 5 MG tablet Take 5 mg by mouth daily.        . aspirin 81 MG tablet Take 81 mg by mouth daily.        . atorvastatin (LIPITOR) 80 MG tablet Take 40 mg by mouth daily.      . AZASITE 1 % ophthalmic solution Place 1 drop into both eyes as directed.       . ergocalciferol (VITAMIN D2) 50000 UNITS capsule Take 50,000 Units by mouth once a week.       . exenatide (BYETTA 10 MCG PEN) 10 MCG/0.04ML SOLN bid      . Ferrous Sulfate (IRON) 325 (65 FE) MG TABS Take 1 tablet by mouth daily.      . glimepiride (AMARYL) 4 MG tablet Take 2 mg by mouth daily before breakfast.      . hydrochlorothiazide (HYDRODIURIL) 25 MG tablet Take 12.5 mg by mouth daily.      . insulin NPH (HUMULIN N,NOVOLIN N) 100 UNIT/ML injection Inject into the skin as directed.       . metFORMIN (GLUCOPHAGE) 1000 MG tablet Take 1,000 mg by mouth 2 (two) times daily with a meal.      . Multiple Vitamin (MULTIVITAMIN) capsule Take 1 capsule by mouth daily.        . Olmesartan Medoxomil (BENICAR PO) Take 1 tablet by mouth daily.      . pioglitazone (ACTOS) 30 MG tablet Take 30 mg by mouth daily.        . vitamin B-12 (CYANOCOBALAMIN) 1000 MCG tablet Take 1,000 mcg by mouth  daily.       No current facility-administered medications for this visit.     Past Medical History  Diagnosis Date  . CAD (coronary artery disease) 1998    stent post heart attack  . DM type 2 (diabetes mellitus, type 2)   . Hypertension   . Hyperlipidemia     Past Surgical History  Procedure Laterality Date  . Breast reduction surgery    . Tubal ligation    . Lad stent      post heart attack  . Tonsillectomy      History   Social History  . Marital Status: Married    Spouse Name: N/A    Number of Children: N/A  . Years of Education: N/A   Occupational History  . Not on file.   Social History Main Topics  . Smoking status: Never Smoker   . Smokeless tobacco: Not on file  . Alcohol Use: Not on file  . Drug Use: Not on file  . Sexually Active: Not on file   Other Topics Concern  .   Not on file   Social History Narrative  . No narrative on file    ROS: no fevers or chills, productive cough, hemoptysis, dysphasia, odynophagia, melena, hematochezia, dysuria, hematuria, rash, seizure activity, orthopnea, PND, pedal edema, claudication. Remaining systems are negative.  Physical Exam: Well-developed well-nourished in no acute distress.  Skin is warm and dry.  HEENT is normal.  Neck is supple.  Chest is clear to auscultation with normal expansion.  Cardiovascular exam is regular rate and rhythm.  Abdominal exam nontender or distended. No masses palpated. Extremities show no edema. neuro grossly intact       

## 2012-06-30 NOTE — Assessment & Plan Note (Signed)
Plan continue ARB. Discontinue amlodipine. Add carvedilol 3.125 by milligrams by mouth twice a day. Increase as tolerated by pulse and blood pressure. Following catheterization and revascularization if needed as well as titration of medications she will need an echocardiogram in 3 months. Ejection fraction less than 35% we'll consider ICD.

## 2012-06-30 NOTE — Assessment & Plan Note (Signed)
Continue aspirin and statin. Patient's Myoview shows new reduction in LV function. She has 25-30 years of diabetes mellitus. Plan to proceed with cardiac catheterization to further assess. The risks and benefits were discussed and the patient agrees to proceed. Hold Glucophage for 48 hours following procedure.

## 2012-06-30 NOTE — Assessment & Plan Note (Signed)
Discontinue Norvasc and begin carvedilol as outlined.

## 2012-06-30 NOTE — Assessment & Plan Note (Signed)
Continue statin. 

## 2012-06-30 NOTE — Patient Instructions (Addendum)
STOP AMLODIPINE  START CARVEDILOL 3.125 MG ONE TABLET TWICE DAILY  Your physician has requested that you have a cardiac catheterization. Cardiac catheterization is used to diagnose and/or treat various heart conditions. Doctors may recommend this procedure for a number of different reasons. The most common reason is to evaluate chest pain. Chest pain can be a symptom of coronary artery disease (CAD), and cardiac catheterization can show whether plaque is narrowing or blocking your heart's arteries. This procedure is also used to evaluate the valves, as well as measure the blood flow and oxygen levels in different parts of your heart. For further information please visit [email or web address] Please follow instruction sheet, as given.   Your physician recommends that you HAVE LAB WORK TODAY  A chest x-ray takes a picture of the organs and structures inside the chest, including the heart, lungs, and blood vessels. This test can show several things, including, whether the heart is enlarges; whether fluid is building up in the lungs; and whether pacemaker / defibrillator leads are still in place. AT [address] OFFICE

## 2012-07-04 ENCOUNTER — Ambulatory Visit (HOSPITAL_COMMUNITY)
Admission: RE | Admit: 2012-07-04 | Discharge: 2012-07-04 | Disposition: A | Discharge status: Home / Self Care | Payer: Medicare Other | Source: Ambulatory Visit | Attending: Cardiology | Admitting: Cardiology

## 2012-07-04 DIAGNOSIS — E119 Type 2 diabetes mellitus without complications: Secondary | ICD-10-CM | POA: Insufficient documentation

## 2012-07-04 DIAGNOSIS — I25810 Atherosclerosis of coronary artery bypass graft(s) without angina pectoris: Secondary | ICD-10-CM

## 2012-07-04 DIAGNOSIS — I10 Essential (primary) hypertension: Secondary | ICD-10-CM | POA: Insufficient documentation

## 2012-07-04 DIAGNOSIS — R9430 Abnormal result of cardiovascular function study, unspecified: Secondary | ICD-10-CM

## 2012-07-04 DIAGNOSIS — I2510 Atherosclerotic heart disease of native coronary artery without angina pectoris: Secondary | ICD-10-CM | POA: Insufficient documentation

## 2012-07-04 DIAGNOSIS — R0989 Other specified symptoms and signs involving the circulatory and respiratory systems: Secondary | ICD-10-CM | POA: Insufficient documentation

## 2012-07-06 ENCOUNTER — Other Ambulatory Visit: Payer: Self-pay | Admitting: *Deleted

## 2012-07-06 ENCOUNTER — Inpatient Hospital Stay (HOSPITAL_COMMUNITY)
Admission: AD | Admit: 2012-07-06 | Discharge: 2012-07-18 | DRG: 236 | Disposition: A | Discharge status: Home Health | Payer: Medicare Other | Source: Ambulatory Visit | Attending: Thoracic Surgery (Cardiothoracic Vascular Surgery) | Admitting: Thoracic Surgery (Cardiothoracic Vascular Surgery)

## 2012-07-06 ENCOUNTER — Inpatient Hospital Stay (HOSPITAL_BASED_OUTPATIENT_CLINIC_OR_DEPARTMENT_OTHER)
Admission: RE | Admit: 2012-07-06 | Discharge: 2012-07-06 | Disposition: A | Discharge status: Home / Self Care | Payer: Medicare Other | Source: Ambulatory Visit | Attending: Cardiovascular Disease | Admitting: Cardiovascular Disease

## 2012-07-06 ENCOUNTER — Encounter (HOSPITAL_BASED_OUTPATIENT_CLINIC_OR_DEPARTMENT_OTHER)
Admission: RE | Disposition: A | Discharge status: Home / Self Care | Payer: Self-pay | Source: Ambulatory Visit | Attending: Cardiovascular Disease

## 2012-07-06 ENCOUNTER — Encounter (HOSPITAL_COMMUNITY): Payer: Self-pay | Admitting: General Practice

## 2012-07-06 DIAGNOSIS — E785 Hyperlipidemia, unspecified: Secondary | ICD-10-CM | POA: Insufficient documentation

## 2012-07-06 DIAGNOSIS — R5381 Other malaise: Secondary | ICD-10-CM | POA: Diagnosis not present

## 2012-07-06 DIAGNOSIS — I2582 Chronic total occlusion of coronary artery: Secondary | ICD-10-CM | POA: Diagnosis present

## 2012-07-06 DIAGNOSIS — R9430 Abnormal result of cardiovascular function study, unspecified: Secondary | ICD-10-CM

## 2012-07-06 DIAGNOSIS — I129 Hypertensive chronic kidney disease with stage 1 through stage 4 chronic kidney disease, or unspecified chronic kidney disease: Secondary | ICD-10-CM | POA: Diagnosis present

## 2012-07-06 DIAGNOSIS — I252 Old myocardial infarction: Secondary | ICD-10-CM | POA: Insufficient documentation

## 2012-07-06 DIAGNOSIS — Q211 Atrial septal defect: Secondary | ICD-10-CM

## 2012-07-06 DIAGNOSIS — Z9861 Coronary angioplasty status: Secondary | ICD-10-CM | POA: Insufficient documentation

## 2012-07-06 DIAGNOSIS — Z8249 Family history of ischemic heart disease and other diseases of the circulatory system: Secondary | ICD-10-CM

## 2012-07-06 DIAGNOSIS — R110 Nausea: Secondary | ICD-10-CM | POA: Diagnosis not present

## 2012-07-06 DIAGNOSIS — E119 Type 2 diabetes mellitus without complications: Secondary | ICD-10-CM | POA: Insufficient documentation

## 2012-07-06 DIAGNOSIS — I2589 Other forms of chronic ischemic heart disease: Secondary | ICD-10-CM | POA: Insufficient documentation

## 2012-07-06 DIAGNOSIS — E8779 Other fluid overload: Secondary | ICD-10-CM | POA: Diagnosis not present

## 2012-07-06 DIAGNOSIS — J9819 Other pulmonary collapse: Secondary | ICD-10-CM | POA: Diagnosis not present

## 2012-07-06 DIAGNOSIS — N189 Chronic kidney disease, unspecified: Secondary | ICD-10-CM | POA: Diagnosis present

## 2012-07-06 DIAGNOSIS — I255 Ischemic cardiomyopathy: Secondary | ICD-10-CM

## 2012-07-06 DIAGNOSIS — Z79899 Other long term (current) drug therapy: Secondary | ICD-10-CM | POA: Insufficient documentation

## 2012-07-06 DIAGNOSIS — M1710 Unilateral primary osteoarthritis, unspecified knee: Secondary | ICD-10-CM | POA: Diagnosis present

## 2012-07-06 DIAGNOSIS — I2510 Atherosclerotic heart disease of native coronary artery without angina pectoris: Secondary | ICD-10-CM | POA: Insufficient documentation

## 2012-07-06 DIAGNOSIS — R0609 Other forms of dyspnea: Secondary | ICD-10-CM | POA: Insufficient documentation

## 2012-07-06 DIAGNOSIS — Z8042 Family history of malignant neoplasm of prostate: Secondary | ICD-10-CM

## 2012-07-06 DIAGNOSIS — I4729 Other ventricular tachycardia: Secondary | ICD-10-CM | POA: Diagnosis not present

## 2012-07-06 DIAGNOSIS — Q2111 Secundum atrial septal defect: Secondary | ICD-10-CM

## 2012-07-06 DIAGNOSIS — I472 Ventricular tachycardia, unspecified: Secondary | ICD-10-CM | POA: Diagnosis not present

## 2012-07-06 DIAGNOSIS — Z888 Allergy status to other drugs, medicaments and biological substances status: Secondary | ICD-10-CM

## 2012-07-06 DIAGNOSIS — Z794 Long term (current) use of insulin: Secondary | ICD-10-CM

## 2012-07-06 DIAGNOSIS — Z6841 Body Mass Index (BMI) 40.0 and over, adult: Secondary | ICD-10-CM

## 2012-07-06 DIAGNOSIS — I10 Essential (primary) hypertension: Secondary | ICD-10-CM | POA: Insufficient documentation

## 2012-07-06 DIAGNOSIS — R0989 Other specified symptoms and signs involving the circulatory and respiratory systems: Secondary | ICD-10-CM | POA: Insufficient documentation

## 2012-07-06 DIAGNOSIS — Z800 Family history of malignant neoplasm of digestive organs: Secondary | ICD-10-CM

## 2012-07-06 DIAGNOSIS — Z803 Family history of malignant neoplasm of breast: Secondary | ICD-10-CM

## 2012-07-06 DIAGNOSIS — I519 Heart disease, unspecified: Secondary | ICD-10-CM | POA: Insufficient documentation

## 2012-07-06 DIAGNOSIS — Z801 Family history of malignant neoplasm of trachea, bronchus and lung: Secondary | ICD-10-CM

## 2012-07-06 DIAGNOSIS — D62 Acute posthemorrhagic anemia: Secondary | ICD-10-CM | POA: Diagnosis not present

## 2012-07-06 DIAGNOSIS — Z7982 Long term (current) use of aspirin: Secondary | ICD-10-CM

## 2012-07-06 HISTORY — DX: Unspecified osteoarthritis, unspecified site: M19.90

## 2012-07-06 HISTORY — PX: CARDIAC CATHETERIZATION: SHX172

## 2012-07-06 HISTORY — DX: Migraine, unspecified, not intractable, without status migrainosus: G43.909

## 2012-07-06 HISTORY — DX: Acute myocardial infarction, unspecified: I21.9

## 2012-07-06 HISTORY — DX: Other specified postprocedural states: R11.2

## 2012-07-06 HISTORY — DX: Shortness of breath: R06.02

## 2012-07-06 HISTORY — DX: Nausea with vomiting, unspecified: Z98.890

## 2012-07-06 HISTORY — DX: Iron deficiency anemia, unspecified: D50.9

## 2012-07-06 LAB — POCT I-STAT GLUCOSE
Glucose, Bld: 76 mg/dL (ref 70–99)
Operator id: 141321

## 2012-07-06 SURGERY — JV LEFT HEART CATHETERIZATION WITH CORONARY ANGIOGRAM

## 2012-07-06 MED ORDER — VITAMIN B-12 1000 MCG PO TABS
1000.0000 ug | ORAL_TABLET | Freq: Every day | ORAL | Status: DC
  Administered 2012-07-07 – 2012-07-18 (×11): 1000 ug via ORAL
  Filled 2012-07-06 (×13): qty 1

## 2012-07-06 MED ORDER — DIAZEPAM 2 MG PO TABS
2.0000 mg | ORAL_TABLET | ORAL | Status: AC
  Administered 2012-07-06: 2 mg via ORAL

## 2012-07-06 MED ORDER — SODIUM CHLORIDE 0.9 % IJ SOLN: 3.0000 mL | INTRAMUSCULAR | Status: DC | PRN

## 2012-07-06 MED ORDER — INSULIN ASPART 100 UNIT/ML ~~LOC~~ SOLN
0.0000 [IU] | Freq: Three times a day (TID) | SUBCUTANEOUS | Status: DC
  Administered 2012-07-08 – 2012-07-10 (×3): 2 [IU] via SUBCUTANEOUS

## 2012-07-06 MED ORDER — ADULT MULTIVITAMIN W/MINERALS CH
1.0000 | ORAL_TABLET | Freq: Every day | ORAL | Status: DC
  Administered 2012-07-07 – 2012-07-10 (×4): 1 via ORAL
  Filled 2012-07-06 (×6): qty 1

## 2012-07-06 MED ORDER — SODIUM CHLORIDE 0.9 % IJ SOLN: 3.0000 mL | Freq: Two times a day (BID) | INTRAMUSCULAR | Status: DC

## 2012-07-06 MED ORDER — SODIUM CHLORIDE 0.9 % IV SOLN
250.0000 mL | INTRAVENOUS | Status: DC | PRN
  Administered 2012-07-07: 10 mL/h via INTRAVENOUS

## 2012-07-06 MED ORDER — ACETAMINOPHEN 325 MG PO TABS: 650.0000 mg | ORAL_TABLET | ORAL | Status: DC | PRN

## 2012-07-06 MED ORDER — SODIUM CHLORIDE 0.9 % IV SOLN: 250.0000 mL | INTRAVENOUS | Status: DC | PRN

## 2012-07-06 MED ORDER — ZOLPIDEM TARTRATE 5 MG PO TABS: 5.0000 mg | ORAL_TABLET | Freq: Every evening | ORAL | Status: DC | PRN

## 2012-07-06 MED ORDER — ONDANSETRON HCL 4 MG/2ML IJ SOLN: 4.0000 mg | Freq: Four times a day (QID) | INTRAMUSCULAR | Status: DC | PRN

## 2012-07-06 MED ORDER — ASPIRIN 81 MG PO CHEW
324.0000 mg | CHEWABLE_TABLET | ORAL | Status: AC
  Administered 2012-07-06: 243 mg via ORAL

## 2012-07-06 MED ORDER — SODIUM CHLORIDE 0.9 % IV SOLN: INTRAVENOUS | Status: DC

## 2012-07-06 MED ORDER — MULTIVITAMINS PO CAPS: 1.0000 | ORAL_CAPSULE | Freq: Every day | ORAL | Status: DC

## 2012-07-06 MED ORDER — EXENATIDE 10 MCG/0.04ML ~~LOC~~ SOPN
10.0000 ug | PEN_INJECTOR | Freq: Two times a day (BID) | SUBCUTANEOUS | Status: DC
  Administered 2012-07-06: 10 ug via SUBCUTANEOUS
  Filled 2012-07-06: qty 2.4

## 2012-07-06 MED ORDER — NITROGLYCERIN 0.4 MG SL SUBL: 0.4000 mg | SUBLINGUAL_TABLET | SUBLINGUAL | Status: DC | PRN

## 2012-07-06 MED ORDER — ATORVASTATIN CALCIUM 40 MG PO TABS
40.0000 mg | ORAL_TABLET | Freq: Every day | ORAL | Status: DC
  Administered 2012-07-06 – 2012-07-17 (×10): 40 mg via ORAL
  Filled 2012-07-06 (×13): qty 1

## 2012-07-06 MED ORDER — ALPRAZOLAM 0.25 MG PO TABS: 0.2500 mg | ORAL_TABLET | Freq: Two times a day (BID) | ORAL | Status: DC | PRN

## 2012-07-06 MED ORDER — HEPARIN (PORCINE) IN NACL 100-0.45 UNIT/ML-% IJ SOLN
1050.0000 [IU]/h | INTRAMUSCULAR | Status: DC
  Administered 2012-07-06: 1100 [IU]/h via INTRAVENOUS
  Administered 2012-07-07 – 2012-07-10 (×4): 1050 [IU]/h via INTRAVENOUS
  Filled 2012-07-06 (×6): qty 250

## 2012-07-06 MED ORDER — ASPIRIN EC 81 MG PO TBEC
81.0000 mg | DELAYED_RELEASE_TABLET | Freq: Every day | ORAL | Status: DC
  Administered 2012-07-07 – 2012-07-10 (×4): 81 mg via ORAL
  Filled 2012-07-06 (×6): qty 1

## 2012-07-06 MED ORDER — FERROUS SULFATE 325 (65 FE) MG PO TABS
325.0000 mg | ORAL_TABLET | Freq: Every day | ORAL | Status: DC
  Administered 2012-07-07 – 2012-07-10 (×4): 325 mg via ORAL
  Filled 2012-07-06 (×6): qty 1

## 2012-07-06 MED ORDER — INSULIN NPH (HUMAN) (ISOPHANE) 100 UNIT/ML ~~LOC~~ SUSP
5.0000 [IU] | Freq: Every day | SUBCUTANEOUS | Status: DC
  Administered 2012-07-06 – 2012-07-10 (×5): 5 [IU] via SUBCUTANEOUS
  Filled 2012-07-06: qty 10

## 2012-07-06 MED ORDER — ACETAMINOPHEN 325 MG PO TABS
650.0000 mg | ORAL_TABLET | ORAL | Status: DC | PRN
  Administered 2012-07-09 – 2012-07-10 (×3): 650 mg via ORAL
  Filled 2012-07-06 (×4): qty 2

## 2012-07-06 MED ORDER — SODIUM CHLORIDE 0.9 % IV SOLN: 1.0000 mL/kg/h | INTRAVENOUS | Status: DC

## 2012-07-06 MED ORDER — CARVEDILOL 3.125 MG PO TABS
3.1250 mg | ORAL_TABLET | Freq: Two times a day (BID) | ORAL | Status: DC
  Administered 2012-07-06 – 2012-07-10 (×9): 3.125 mg via ORAL
  Filled 2012-07-06 (×12): qty 1

## 2012-07-06 NOTE — Progress Notes (Signed)
Pt received from cath procedure alert and oriented.  Pt able to tolerate Orange Juice without any difficulty.  Dr Alyson Ingles in to talk to pt and family about the results of test and plan of care.  Bed control called for inpatient telemetry bed.  EKG attached to pt at this time.  Pt denies any discomfort.

## 2012-07-06 NOTE — OR Nursing (Signed)
Meal served 

## 2012-07-06 NOTE — Progress Notes (Signed)
Pt ambulated to the bathroom and voided without any difficulty. Pt transferred to 3 west room 14.  Pt denies any discomfort at this time. Tranported with monitor and family at bedside.

## 2012-07-06 NOTE — Progress Notes (Signed)
ANTICOAGULATION CONSULT NOTE - Initial Consult  Pharmacy Consult for [facility] Indication: ACS/CAD  Allergies  Allergen Reactions  . Demerol   . Percodan (Oxycodone-Aspirin)     Patient Measurements: Height: 5' 4.17" (163 cm) Weight: 222 lb 0.1 oz (100.7 kg) IBW/kg (Calculated) : 55.1 Heparin Dosing Weight: 78kg  Vital Signs: Temp: 97.2 F (36.2 C) (05/14 1426) Temp src: Oral (05/14 1426) BP: 114/56 mmHg (05/14 1426) Pulse Rate: 72 (05/14 1426)  Labs: No results found for this basename: HGB, HCT, PLT, APTT, LABPROT, INR, HEPARINUNFRC, CREATININE, CKTOTAL, CKMB, TROPONINI,  in the last 72 hours  Estimated Creatinine Clearance: 64.4 ml/min (by C-G formula based on Cr of 0.9).   Medical History: Past Medical History  Diagnosis Date  . CAD (coronary artery disease) 1998    stent post heart attack  . DM type 2 (diabetes mellitus, type 2)   . Hypertension   . Hyperlipidemia     Medications:  Prescriptions prior to admission  Medication Sig Dispense Refill  . aspirin 81 MG tablet Take 81 mg by mouth daily.        Marland Kitchen atorvastatin (LIPITOR) 80 MG tablet Take 40 mg by mouth daily.      Marland Kitchen azithromycin (AZASITE) 1 % ophthalmic solution Place 1 drop into both eyes 2 (two) times daily.      . carvedilol (COREG) 3.125 MG tablet Take 1 tablet (3.125 mg total) by mouth 2 (two) times daily.  180 tablet  3  . ergocalciferol (VITAMIN D2) 50000 UNITS capsule Take 50,000 Units by mouth once a week.       Marland Kitchen exenatide (BYETTA 10 MCG PEN) 10 MCG/0.04ML SOLN bid      . Ferrous Sulfate (IRON) 325 (65 FE) MG TABS Take 1 tablet by mouth daily.      Marland Kitchen glimepiride (AMARYL) 4 MG tablet Take 2 mg by mouth daily before breakfast.      . glucose blood (SMARTEST TEST) test strip 1 each by Other route 2 (two) times daily. Use as instructed      . hydrochlorothiazide (HYDRODIURIL) 25 MG tablet Take 12.5 mg by mouth daily.      . insulin NPH (HUMULIN N,NOVOLIN N) 100 UNIT/ML injection Inject 5 Units into the  skin at bedtime.       . Multiple Vitamin (MULTIVITAMIN) capsule Take 1 capsule by mouth daily.        . Multiple Vitamins-Minerals (CENTRUM SILVER ADULT 50+ PO) Take 1 tablet by mouth daily.      . Olmesartan Medoxomil (BENICAR PO) Take 1 tablet by mouth daily.      . pioglitazone (ACTOS) 30 MG tablet Take 30 mg by mouth daily.        . vitamin B-12 (CYANOCOBALAMIN) 1000 MCG tablet Take 1,000 mcg by mouth daily.      . metFORMIN (GLUCOPHAGE) 1000 MG tablet Take 1,000 mg by mouth 2 (two) times daily with a meal.        Assessment: 72 y/o female patient admitted with dyspnea, now s/p cath and found to have severe 3 vessel cardiac disease. Plan for patient to have possible OHS. Ok to begin heparin without bolus for further anticoagulation.   Goal of Therapy:  Heparin level 0.3-0.7 units/ml Monitor platelets by anticoagulation protocol: Yes   Plan:  Begin heparin gtt at 1100 units/hr and check 8 hour heparin level with daily cbc and heparin level.  Verlene Mayer, PharmD, BCPS Pager [phone] 07/06/2012,3:52 PM

## 2012-07-06 NOTE — H&P (Signed)
HPI: 72 yo female pt of Dr. Jens Som with history of CAD with bare metal stent proximal LAD in 1998 in setting of acute MI, DM, HTN, HLD, ischemic cardiomyopathy admitted post cath today. She has had recent c/o dyspnea with exertion. Stress myoview with LVEF 23%, large scar burden anterior wall, LV dilatation with stress. (high risk stress test).  Echo in May of 2012 showed EF 45-50, mild LAE and mild TR. Carotid dopplers 5/08 revealed tortuosity but no obstructive disease.   She is admitted for further planning for revascularization today. Her vessels are diffusely diseased and not favorable for PCI. CT surgery consult placed. Cardiac MRI in am.   Current Outpatient Prescriptions   Medication  Sig  Dispense  Refill   .  ACCU-CHEK AVIVA PLUS test strip      .  amLODipine (NORVASC) 5 MG tablet  Take 5 mg by mouth daily.     Marland Kitchen  aspirin 81 MG tablet  Take 81 mg by mouth daily.     Marland Kitchen  atorvastatin (LIPITOR) 80 MG tablet  Take 40 mg by mouth daily.     .  AZASITE 1 % ophthalmic solution  Place 1 drop into both eyes as directed.     .  ergocalciferol (VITAMIN D2) 50000 UNITS capsule  Take 50,000 Units by mouth once a week.     Marland Kitchen  exenatide (BYETTA 10 MCG PEN) 10 MCG/0.04ML SOLN  bid     .  Ferrous Sulfate (IRON) 325 (65 FE) MG TABS  Take 1 tablet by mouth daily.     Marland Kitchen  glimepiride (AMARYL) 4 MG tablet  Take 2 mg by mouth daily before breakfast.     .  hydrochlorothiazide (HYDRODIURIL) 25 MG tablet  Take 12.5 mg by mouth daily.     .  insulin NPH (HUMULIN N,NOVOLIN N) 100 UNIT/ML injection  Inject into the skin as directed.     .  metFORMIN (GLUCOPHAGE) 1000 MG tablet  Take 1,000 mg by mouth 2 (two) times daily with a meal.     .  Multiple Vitamin (MULTIVITAMIN) capsule  Take 1 capsule by mouth daily.     .  Olmesartan Medoxomil (BENICAR PO)  Take 1 tablet by mouth daily.     .  pioglitazone (ACTOS) 30 MG tablet  Take 30 mg by mouth daily.     .  vitamin B-12 (CYANOCOBALAMIN) 1000 MCG tablet  Take  1,000 mcg by mouth daily.      Past Medical History   Diagnosis  Date   .  CAD (coronary artery disease)  1998     stent post heart attack   .  DM type 2 (diabetes mellitus, type 2)    .  Hypertension    .  Hyperlipidemia                          Ishcmic cardiomyopathy  Past Surgical History   Procedure  Laterality  Date   .  Breast reduction surgery     .  Tubal ligation     .  Lad stent       post heart attack   .  Tonsillectomy      History    Family History  Problem Relation Age of Onset  . Heart attack Mother     MI at age 70  . Breast cancer Sister   . Throat cancer Brother   . Lung cancer Brother   .  Prostate cancer Father   . Hypertension Mother     Social History   .  Marital Status:  Married     Spouse Name:  N/A     Number of Children:  N/A   .  Years of Education:  N/A    Occupational History   .  Not on file.    Social History Main Topics   .  Smoking status:  Never Smoker   .  Smokeless tobacco:  Not on file   .  Alcohol Use:  Not on file   .  Drug Use:  Not on file   .  Sexually Active:  Not on file    Other Topics  Concern   .  Not on file    Social History Narrative   .  No narrative on file    ROS: no fevers or chills, productive cough, hemoptysis, dysphasia, odynophagia, melena, hematochezia, dysuria, hematuria, rash, seizure activity, orthopnea, PND, pedal edema, claudication. Remaining systems are negative.  + for dyspnea with exertion.   Physical Exam:  Well-developed well-nourished in no acute distress.  Skin is warm and dry.  HEENT is normal.  Neck is supple.  Chest is clear to auscultation with normal expansion.  Cardiovascular exam is regular rate and rhythm.  Abdominal exam nontender or distended. No masses palpated.  Extremities show no edema.  neuro grossly intact  Assessment and Plan:   1. CAD/Ischemic cardiomyopathy: Severe three vessel disease on cath today in diabetic patient. LVEF 23% by stress myoview with  probable anterior wall scar. Will admit to telemetry unit today. I have discussed the case with her primary cardiologist Dr. Jens Som. Will plan cardiac MRI tomorrow to assess for viability. I think CABG is her best revascularization option. Her vessels would not be easily treated with PCI. Will consult CT surgery.

## 2012-07-06 NOTE — Consult Note (Signed)
Reason for Consult:Severe 3 vessel CAD Referring Physician: Drs. McAlhany and Jeanice Dempsey is an 72 y.o. female.  HPI: 72 yo woman with a history of CAD(stent to LAD after MI in 1998), HTN, hyperlipidemia, and diabetes. She recently has been experiencing SOB with exertion and sometimes eating. She denies any chest pain. She has been feeling tired and has been fatiguing easily for the past 6-8 months. She had a nuclear study in March which showed her EF to be 25-30% with an anteroapical infarct ( EF was 45-50%) in 2012. She was scheduled for elective catheterization which was done today. It showed severe 3 vessel CAD with a totally occluded LAD.    Past Medical History  Diagnosis Date  . CAD (coronary artery disease) 1998    stent post heart attack  . DM type 2 (diabetes mellitus, type 2)   . Hypertension   . Hyperlipidemia     Past Surgical History  Procedure Laterality Date  . Breast reduction surgery    . Tubal ligation    . Lad stent      post heart attack  . Tonsillectomy      Family History  Problem Relation Age of Onset  . Heart attack Mother     MI at age 57  . Breast cancer Sister   . Throat cancer Brother   . Lung cancer Brother   . Prostate cancer Father   . Hypertension Mother     Social History:  reports that she has never smoked. She does not have any smokeless tobacco history on file. Her alcohol and drug histories are not on file.  Allergies:  Allergies  Allergen Reactions  . Demerol   . Percodan (Oxycodone-Aspirin)     Medications:  Prior to Admission:  Prescriptions prior to admission  Medication Sig Dispense Refill  . aspirin 81 MG tablet Take 81 mg by mouth daily.        Marland Kitchen atorvastatin (LIPITOR) 80 MG tablet Take 40 mg by mouth daily.      Marland Kitchen azithromycin (AZASITE) 1 % ophthalmic solution Place 1 drop into both eyes 2 (two) times daily.      . carvedilol (COREG) 3.125 MG tablet Take 1 tablet (3.125 mg total) by mouth 2 (two) times  daily.  180 tablet  3  . ergocalciferol (VITAMIN D2) 50000 UNITS capsule Take 50,000 Units by mouth once a week.       Marland Kitchen exenatide (BYETTA 10 MCG PEN) 10 MCG/0.04ML SOLN bid      . Ferrous Sulfate (IRON) 325 (65 FE) MG TABS Take 1 tablet by mouth daily.      Marland Kitchen glimepiride (AMARYL) 4 MG tablet Take 2 mg by mouth daily before breakfast.      . glucose blood (SMARTEST TEST) test strip 1 each by Other route 2 (two) times daily. Use as instructed      . hydrochlorothiazide (HYDRODIURIL) 25 MG tablet Take 12.5 mg by mouth daily.      . insulin NPH (HUMULIN N,NOVOLIN N) 100 UNIT/ML injection Inject 5 Units into the skin at bedtime.       . Multiple Vitamin (MULTIVITAMIN) capsule Take 1 capsule by mouth daily.        . Multiple Vitamins-Minerals (CENTRUM SILVER ADULT 50+ PO) Take 1 tablet by mouth daily.      . Olmesartan Medoxomil (BENICAR PO) Take 1 tablet by mouth daily.      . pioglitazone (ACTOS) 30 MG tablet Take 30 mg  by mouth daily.        . vitamin B-12 (CYANOCOBALAMIN) 1000 MCG tablet Take 1,000 mcg by mouth daily.      . metFORMIN (GLUCOPHAGE) 1000 MG tablet Take 1,000 mg by mouth 2 (two) times daily with a meal.        Results for orders placed during the hospital encounter of 07/06/12 (from the past 48 hour(s))  POCT I-STAT GLUCOSE     Status: None   Collection Time    07/06/12 11:30 AM      Result Value Range   Operator id [id number]     Glucose, Bld 76  70 - 99 mg/dL    Dg Chest 2 View  8/46/9629   *RADIOLOGY REPORT*  Clinical Data: Diabetes, CAD  CHEST - 2 VIEW  Comparison: None.  Findings: Normal cardiac silhouette.  There is central venous pulmonary congestion and mild interstitial edema as well as mild perihilar air space disease.  Findings are most suggestive of pulmonary edema/congestive heart failure.  No pleural fluid is present.  No pneumothorax  IMPRESSION: Central venous congestion and mild perihilar air space disease suggests pulmonary edema.  Cannot exclude atypical  pneumonia.   Original Report Authenticated By: Genevive Bi, M.D.    Review of Systems  Constitutional: Positive for malaise/fatigue. Negative for fever, chills and weight loss.  Respiratory: Positive for cough, sputum production and shortness of breath (with exertion and sometimes eating). Negative for hemoptysis.   Cardiovascular: Positive for leg swelling. Negative for chest pain, palpitations and orthopnea.  Gastrointestinal: Positive for heartburn.  Genitourinary: Negative for dysuria.  Musculoskeletal: Positive for joint pain (needs L TKR).  Neurological: Negative.   All other systems reviewed and are negative.   Blood pressure 114/56, pulse 72, temperature 97.2 F (36.2 C), temperature source Oral, resp. rate 18, SpO2 100.00%. Physical Exam  Vitals reviewed. Constitutional: She is oriented to person, place, and time. No distress.  Morbidly obese  HENT:  Head: Normocephalic and atraumatic.  Eyes: EOM are normal. Pupils are equal, round, and reactive to light.  Neck: Normal range of motion. Neck supple. No thyromegaly present.  No carotid bruits  Cardiovascular: Normal rate, regular rhythm and intact distal pulses.  Exam reveals gallop (+ S4?). Exam reveals no friction rub.   No murmur heard. Respiratory: Effort normal and breath sounds normal.  GI: Soft. There is no tenderness.  Musculoskeletal: She exhibits no edema.  Lymphadenopathy:    She has no cervical adenopathy.  Neurological: She is alert and oriented to person, place, and time. No cranial nerve deficit.  Skin: Skin is warm and dry.   CARDIAC CATHETERIZATION Hemodynamic Findings:  Central aortic pressure: 105/48  Left ventricular pressure: 106/21/22  Angiographic Findings:  Left main: No obstructive disease.  Left Anterior Descending Artery: Large caliber vessel that courses to the apex. Severe calcification noted in the proximal and mid vessel. The proximal vessel has a stented segment. There is 100% total  occlusion within the proximal portion of the stent. The mid and distal vessel is seen to fill faintly from left to left collaterals. The diagonal branch is moderate in caliber with diffuse 50% proximal stenosis followed by a bifurcation. The inferior sub-branch of the diagonal is small in caliber with 90% ostial stenosis. The superior sub-branch of the diagonal is patent.  Circumflex Artery: Moderate caliber vessel with small caliber first obtuse marginal branch followed by a moderate caliber second diagonal branch. Severe calcification noted in the proximal vessel. The mid AV groove Circumflex leading  into the second OM branch has a long segment of 80% stenosis.  Right Coronary Artery: Moderate caliber dominant vessel with severe calcification noted in the proximal/mid and distal segments. The proximal vessel has diffuse 50% stenosis. The mid vessel has a 99% stenosis just before a long segment of moderate 60% stenosis. The distal vessel has a focal 99% stenosis just before the bifurcation into the PDA and posterolateral branches.  Left Ventricular Angiogram: Deferred.  Impression:  1. Severe triple vessel CAD  2. Severe LV systolic dysfunction with suggestion of anterior wall scar on the stress myoview.  3. Exertional dyspnea  Recommendations: Diabetic patient with severe 3 vessel CAD and severe LV systolic dysfunction. Will admit to telemetry. Cardiac MRI in am to assess for viability. Will consult CT surgery for possible CABG. She does not have favorable vessels for PCI with diffuse calcification in all vessels, CTO of the proximal LAD and high grade bifurcational disease in the RCA.   Assessment/Plan: 72 yo woman with multiple CRF and a history of CAD presents with exertional dyspnea. W/u shows severe 3 vessel CAD with severe LV dysfunction.  CABG is indicated for survival benefit and relief of symptoms. She is relatively high risk given obesity, DM and severe LV dysfunction  I have discussed  with her the general nature of the procedure, the need for general anesthesia, and the incisions to be used. I discussed the expected hospital stay, overall recovery and short and long term outcomes. She understands the risks of surgery include, but are not limited to, death, stroke, MI, DVT/PE, bleeding, possible need for transfusion, infections, cardiac arrhythmias, and other organ system dysfunction including respiratory, renal, or GI complications. She accepts these risks and agrees to proceed.  Plan CABG Monday 5/19- 1st available  Gwendalynn Eckstrom C 07/06/2012, 3:38 PM

## 2012-07-06 NOTE — Interval H&P Note (Signed)
History and Physical Interval Note:  07/06/2012 10:51 AM  Ashlee [name]  has presented today for cardiac cath with the diagnosis of dyspnea, new LV dysfunction.  The various methods of treatment have been discussed with the patient and family. After consideration of risks, benefits and other options for treatment, the patient has consented to  Procedure(s): JV LEFT HEART CATHETERIZATION WITH CORONARY ANGIOGRAM (N/A) as a surgical intervention .  The patient's history has been reviewed, patient examined, no change in status, stable for surgery.  I have reviewed the patient's chart and labs.  Questions were answered to the patient's satisfaction.     [name]

## 2012-07-06 NOTE — H&P (View-Only) (Signed)
HPI: Pleasant female for fu of CAD. Pt had PCI of LAD in 1998 following MI. Echo in May of 2012 showed EF 45-50, mild LAE and mild TR. Carotid dopplers 5/08 revealed tortuosity but no obstructive disease. When I saw her in March of 2014 we repeated her nuclear study. This showed anteroseptal and apical infarct. Her ejection fraction had decreased to 23%. Since I last saw her, the patient has dyspnea with more extreme activities but not with routine activities. It is relieved with rest. It is not associated with chest pain. There is no orthopnea, PND or pedal edema. There is no syncope or palpitations. There is no exertional chest pain.    Current Outpatient Prescriptions  Medication Sig Dispense Refill  . ACCU-CHEK AVIVA PLUS test strip       . amLODipine (NORVASC) 5 MG tablet Take 5 mg by mouth daily.        Marland Kitchen aspirin 81 MG tablet Take 81 mg by mouth daily.        Marland Kitchen atorvastatin (LIPITOR) 80 MG tablet Take 40 mg by mouth daily.      . AZASITE 1 % ophthalmic solution Place 1 drop into both eyes as directed.       . ergocalciferol (VITAMIN D2) 50000 UNITS capsule Take 50,000 Units by mouth once a week.       Marland Kitchen exenatide (BYETTA 10 MCG PEN) 10 MCG/0.04ML SOLN bid      . Ferrous Sulfate (IRON) 325 (65 FE) MG TABS Take 1 tablet by mouth daily.      Marland Kitchen glimepiride (AMARYL) 4 MG tablet Take 2 mg by mouth daily before breakfast.      . hydrochlorothiazide (HYDRODIURIL) 25 MG tablet Take 12.5 mg by mouth daily.      . insulin NPH (HUMULIN N,NOVOLIN N) 100 UNIT/ML injection Inject into the skin as directed.       . metFORMIN (GLUCOPHAGE) 1000 MG tablet Take 1,000 mg by mouth 2 (two) times daily with a meal.      . Multiple Vitamin (MULTIVITAMIN) capsule Take 1 capsule by mouth daily.        . Olmesartan Medoxomil (BENICAR PO) Take 1 tablet by mouth daily.      . pioglitazone (ACTOS) 30 MG tablet Take 30 mg by mouth daily.        . vitamin B-12 (CYANOCOBALAMIN) 1000 MCG tablet Take 1,000 mcg by mouth  daily.       No current facility-administered medications for this visit.     Past Medical History  Diagnosis Date  . CAD (coronary artery disease) 1998    stent post heart attack  . DM type 2 (diabetes mellitus, type 2)   . Hypertension   . Hyperlipidemia     Past Surgical History  Procedure Laterality Date  . Breast reduction surgery    . Tubal ligation    . Lad stent      post heart attack  . Tonsillectomy      History   Social History  . Marital Status: Married    Spouse Name: N/A    Number of Children: N/A  . Years of Education: N/A   Occupational History  . Not on file.   Social History Main Topics  . Smoking status: Never Smoker   . Smokeless tobacco: Not on file  . Alcohol Use: Not on file  . Drug Use: Not on file  . Sexually Active: Not on file   Other Topics Concern  .  Not on file   Social History Narrative  . No narrative on file    ROS: no fevers or chills, productive cough, hemoptysis, dysphasia, odynophagia, melena, hematochezia, dysuria, hematuria, rash, seizure activity, orthopnea, PND, pedal edema, claudication. Remaining systems are negative.  Physical Exam: Well-developed well-nourished in no acute distress.  Skin is warm and dry.  HEENT is normal.  Neck is supple.  Chest is clear to auscultation with normal expansion.  Cardiovascular exam is regular rate and rhythm.  Abdominal exam nontender or distended. No masses palpated. Extremities show no edema. neuro grossly intact

## 2012-07-06 NOTE — OR Nursing (Signed)
Report called to [place], [employer]

## 2012-07-06 NOTE — OR Nursing (Signed)
Allen's test right hand positive but sluggish

## 2012-07-06 NOTE — CV Procedure (Signed)
   Cardiac Catheterization Operative Report  RYLEN HOU [id number] 5/14/201411:30 AM Junious Silk, MD  Procedure Performed:  1. Left Heart Catheterization 2. Selective Coronary Angiography 3. Left ventricular pressures  Operator: Verne Carrow, MD  Indication:  72 yo female with history of CAD with anterior MI in 1998 with bare metal stent placement in the proximal mid LAD at that time. She has been known to have scar in the anterior wall by myoview with recent worsening in LVEF to 23%. Cardiac cath to exclude progression of CAD.                                    Procedure Details: The risks, benefits, complications, treatment options, and expected outcomes were discussed with the patient. The patient and/or family concurred with the proposed plan, giving informed consent. The patient was brought to the cath lab after IV hydration was begun and oral premedication was given. The patient was further sedated with Versed and Fentanyl. The right groin was prepped and draped in the usual manner. Using the modified Seldinger access technique, a 4 French sheath was placed in the right femoral artery. Standard diagnostic catheters were used to perform selective coronary angiography. A pigtail catheter was used to measure LV pressures.   There were no immediate complications. The patient was taken to the recovery area in stable condition.   Hemodynamic Findings: Central aortic pressure: 105/48 Left ventricular pressure: 106/21/22  Angiographic Findings:  Left main: No obstructive disease.   Left Anterior Descending Artery: Large caliber vessel that courses to the apex. Severe calcification noted in the proximal and mid vessel. The proximal vessel has a stented segment. There is 100% total occlusion within the proximal portion of the stent. The mid and distal vessel is seen to fill faintly from left to left collaterals. The diagonal branch is moderate in caliber with diffuse 50%  proximal stenosis followed by a bifurcation. The inferior sub-branch of the diagonal is small in caliber with 90% ostial stenosis. The superior sub-branch of the diagonal is patent.   Circumflex Artery: Moderate caliber vessel with small caliber first obtuse marginal branch followed by a moderate caliber second diagonal branch. Severe calcification noted in the proximal vessel. The mid AV groove Circumflex leading into the second OM branch has a long segment of 80% stenosis.   Right Coronary Artery: Moderate caliber dominant vessel with severe calcification noted in the proximal/mid and distal segments. The proximal vessel has diffuse 50% stenosis. The mid vessel has a 99% stenosis just before a long segment of moderate 60% stenosis. The distal vessel has a focal 99% stenosis just before the bifurcation into the PDA and posterolateral branches.   Left Ventricular Angiogram: Deferred.   Impression: 1. Severe triple vessel CAD 2. Severe LV systolic dysfunction with suggestion of anterior wall scar on the stress myoview.  3. Exertional dyspnea  Recommendations: Diabetic patient with severe 3 vessel CAD and severe LV systolic dysfunction. Will admit to telemetry. Cardiac MRI in am to assess for viability. Will consult CT surgery for possible CABG. She does not have favorable vessels for PCI with diffuse calcification in all vessels, CTO of the proximal LAD and high grade bifurcational disease in the RCA.        Complications:  None. The patient tolerated the procedure well.

## 2012-07-07 ENCOUNTER — Inpatient Hospital Stay (HOSPITAL_COMMUNITY): Payer: Medicare Other

## 2012-07-07 DIAGNOSIS — Z01810 Encounter for preprocedural cardiovascular examination: Secondary | ICD-10-CM

## 2012-07-07 DIAGNOSIS — I2510 Atherosclerotic heart disease of native coronary artery without angina pectoris: Principal | ICD-10-CM

## 2012-07-07 LAB — BASIC METABOLIC PANEL
BUN: 13 mg/dL (ref 6–23)
Chloride: 104 mEq/L (ref 96–112)
GFR calc non Af Amer: 67 mL/min — ABNORMAL LOW (ref >90)
Glucose, Bld: 135 mg/dL — ABNORMAL HIGH (ref 70–99)
Potassium: 4.2 mEq/L (ref 3.5–5.1)
Sodium: 139 mEq/L (ref 135–145)

## 2012-07-07 LAB — CBC
HCT: 32.6 % — ABNORMAL LOW (ref 36.0–46.0)
MCV: 82.7 fL (ref 78.0–100.0)
Platelets: 210 10*3/uL (ref 150–400)
RBC: 3.94 MIL/uL (ref 3.87–5.11)
RDW: 16.3 % — ABNORMAL HIGH (ref 11.5–15.5)
WBC: 5.7 10*3/uL (ref 4.0–10.5)

## 2012-07-07 LAB — HEPARIN LEVEL (UNFRACTIONATED)
Heparin Unfractionated: 0.46 IU/mL (ref 0.30–0.70)
Heparin Unfractionated: 0.75 IU/mL — ABNORMAL HIGH (ref 0.30–0.70)

## 2012-07-07 LAB — LIPID PANEL
Cholesterol: 177 mg/dL (ref 0–200)
Total CHOL/HDL Ratio: 2.3 RATIO
Triglycerides: 35 mg/dL (ref <150)
VLDL: 7 mg/dL (ref 0–40)

## 2012-07-07 LAB — HEMOGLOBIN A1C: Mean Plasma Glucose: 134 mg/dL — ABNORMAL HIGH (ref <117)

## 2012-07-07 LAB — GLUCOSE, CAPILLARY
Glucose-Capillary: 148 mg/dL — ABNORMAL HIGH (ref 70–99)
Glucose-Capillary: 178 mg/dL — ABNORMAL HIGH (ref 70–99)
Glucose-Capillary: 137 mg/dL — ABNORMAL HIGH (ref 70–99)

## 2012-07-07 MED ORDER — DIAZEPAM 5 MG PO TABS
5.0000 mg | ORAL_TABLET | Freq: Once | ORAL | Status: AC
  Administered 2012-07-07: 5 mg via ORAL
  Filled 2012-07-07: qty 1

## 2012-07-07 MED ORDER — GADOBENATE DIMEGLUMINE 529 MG/ML IV SOLN
30.0000 mL | Freq: Once | INTRAVENOUS | Status: AC | PRN
  Administered 2012-07-07: 30 mL via INTRAVENOUS

## 2012-07-07 NOTE — Progress Notes (Addendum)
ANTICOAGULATION CONSULT NOTE - Follow Up Consult  Pharmacy Consult for [facility] Indication: ACS/CAD  Allergies  Allergen Reactions  . Demerol   . Percodan (Oxycodone-Aspirin)     Patient Measurements: Height: 5' 4.17" (163 cm) Weight: 222 lb 0.1 oz (100.7 kg) IBW/kg (Calculated) : 55.1 Heparin Dosing Weight: 78kg  Vital Signs: Temp: 98.2 F (36.8 C) (05/14 2100) Temp src: Oral (05/14 1426) BP: 110/63 mmHg (05/14 2100) Pulse Rate: 65 (05/14 2100)  Labs:  Recent Labs  07/07/12 0010  HEPARINUNFRC 0.46    Estimated Creatinine Clearance: 64.4 ml/min (by C-G formula based on Cr of 0.9).   Medical History: Past Medical History  Diagnosis Date  . CAD (coronary artery disease) 1998    stent post heart attack  . Hypertension   . Hyperlipidemia   . Myocardial infarction 1998  . PONV (postoperative nausea and vomiting)   . Shortness of breath     "occasionally; could happen at any time" (07/06/2012)  . DM type 2 (diabetes mellitus, type 2)   . Iron deficiency anemia   . Migraines     "ages 41 thru 62; associated w/menstral cycle" (07/06/2012)  . Arthritis     "in my knees" (07/06/2012)    Medications:  Prescriptions prior to admission  Medication Sig Dispense Refill  . aspirin 81 MG tablet Take 81 mg by mouth daily.        Marland Kitchen atorvastatin (LIPITOR) 80 MG tablet Take 40 mg by mouth daily.      Marland Kitchen azithromycin (AZASITE) 1 % ophthalmic solution Place 1 drop into both eyes 2 (two) times daily.      . carvedilol (COREG) 3.125 MG tablet Take 1 tablet (3.125 mg total) by mouth 2 (two) times daily.  180 tablet  3  . ergocalciferol (VITAMIN D2) 50000 UNITS capsule Take 50,000 Units by mouth once a week.       Marland Kitchen exenatide (BYETTA 10 MCG PEN) 10 MCG/0.04ML SOLN bid      . Ferrous Sulfate (IRON) 325 (65 FE) MG TABS Take 1 tablet by mouth daily.      Marland Kitchen glimepiride (AMARYL) 4 MG tablet Take 2 mg by mouth daily before breakfast.      . glucose blood (SMARTEST TEST) test strip 1 each by  Other route 2 (two) times daily. Use as instructed      . hydrochlorothiazide (HYDRODIURIL) 25 MG tablet Take 12.5 mg by mouth daily.      . insulin NPH (HUMULIN N,NOVOLIN N) 100 UNIT/ML injection Inject 5 Units into the skin at bedtime.       . Multiple Vitamin (MULTIVITAMIN) capsule Take 1 capsule by mouth daily.        . Multiple Vitamins-Minerals (CENTRUM SILVER ADULT 50+ PO) Take 1 tablet by mouth daily.      . Olmesartan Medoxomil (BENICAR PO) Take 1 tablet by mouth daily.      . pioglitazone (ACTOS) 30 MG tablet Take 30 mg by mouth daily.        . vitamin B-12 (CYANOCOBALAMIN) 1000 MCG tablet Take 1,000 mcg by mouth daily.      . metFORMIN (GLUCOPHAGE) 1000 MG tablet Take 1,000 mg by mouth 2 (two) times daily with a meal.        Assessment: 72 y/o female patient admitted with dyspnea, now s/p cath and found to have severe 3 vessel cardiac disease. Plan for patient to have possible OHS. Ok for heparin without bolus for further anticoagulation.  Heparin level (0.46) is at-goal  on 1100 units/hr. No bleeding per RN.   Goal of Therapy:  Heparin level 0.3-0.7 units/ml Monitor platelets by anticoagulation protocol: Yes   Plan:  1. Continue IV heparin at 1100 units/hr.  2. Heparin level in 8 hours to confirm dosing.  3. Daily CBC, heparin level  Lorre Munroe, PharmD 07/07/2012,12:42 AM

## 2012-07-07 NOTE — Progress Notes (Signed)
CARDIAC REHAB PHASE I   PRE:  Rate/Rhythm: 65 SR    BP: sitting 130/60    SaO2:   MODE:  Ambulation: 300 ft   POST:  Rate/Rhythm: 83 SR    BP: sitting 130/60     SaO2:   Slow pace, some SOB. Sts she tires easily but felt she did well with walking. No major problems. To recliner. Began pre-op ed. To watch video with family. Will bring book if surgery for certain. Very motivated and positive person. [phone]   Elissa Lovett [place] CES, ACSM 07/07/2012 11:39 AM

## 2012-07-07 NOTE — Progress Notes (Signed)
ANTICOAGULATION CONSULT NOTE - Follow Up Consult  Pharmacy Consult for Heparin Indication: 3 vessel CAD  Allergies  Allergen Reactions  . Demerol   . Percodan (Oxycodone-Aspirin)     Patient Measurements: Height: 5' 4.17" (163 cm) Weight: 224 lb 8 oz (101.833 kg) IBW/kg (Calculated) : 55.1 Heparin Dosing Weight: 78 kg  Vital Signs: Temp: 97.8 F (36.6 C) (05/15 0500) BP: 110/70 mmHg (05/15 0500) Pulse Rate: 67 (05/15 0500)  Labs:  Recent Labs  07/07/12 0010 07/07/12 0545 07/07/12 0800  HGB  --  10.3*  --   HCT  --  32.6*  --   PLT  --  210  --   HEPARINUNFRC 0.46  --  0.75*  CREATININE  --  0.84  --     Estimated Creatinine Clearance: 69.5 ml/min (by C-G formula based on Cr of 0.84).  Assessment:    Initial heparin level at goal (0.46) on 1100 units/hr infusion, but now just above target range.  For cardiac MRI today, planning CABG on Monday, 07/11/12.  Goal of Therapy:  Heparin level 0.3-0.7 units/ml Monitor platelets by anticoagulation protocol: Yes   Plan:   Decrease heparin drip from 1100 to 1050 units/hr.  Continue daily heparin level and CBC.  Will follow up plans.  Dennie Fetters, RPh Pager: [phone] 07/07/2012,10:38 AM

## 2012-07-07 NOTE — Progress Notes (Signed)
Patient ID: Ashlee [name], female   DOB: 12/27/1940, 72 y.o.   MRN: [id number]    HPI: Pleasant female for fu of CAD. Pt had PCI of LAD in 1998 following MI. Echo in May of 2012 showed EF 45-50, mild LAE and mild TR. Carotid dopplers 5/08 revealed tortuosity but no obstructive disease. Seen  March of 2014 we repeated her nuclear study. This showed anteroseptal and apical infarct. Her ejection fraction had decreased to 23%.  Cath by CM with 3VD.  Scheduled for MRI today for viability and CABG Monday with Dr Dorris Fetch for 3VD DM and poor LV function  No complaints this am Sad about missing church event this weekend for 50 year parishioners  . aspirin EC  81 mg Oral Daily  . atorvastatin  40 mg Oral QPC supper  . carvedilol  3.125 mg Oral BID  . diazepam  5 mg Oral Once  . exenatide  10 mcg Subcutaneous BID WC  . ferrous sulfate  325 mg Oral Daily  . insulin aspart  0-15 Units Subcutaneous TID WC  . insulin NPH  5 Units Subcutaneous QHS  . multivitamin with minerals  1 tablet Oral Daily  . sodium chloride  3 mL Intravenous Q12H  . vitamin B-12  1,000 mcg Oral Daily     ROS: no fevers or chills, productive cough, hemoptysis, dysphasia, odynophagia, melena, hematochezia, dysuria, hematuria, rash, seizure activity, orthopnea, PND, pedal edema, claudication. Remaining systems are negative.  Physical Exam: Well-developed well-nourished in no acute distress.  Skin is warm and dry.  HEENT is normal.  Neck is supple.  Chest is clear to auscultation with normal expansion.  Cardiovascular exam is regular rate and rhythm.  Abdominal exam nontender or distended. No masses palpated. Extremities show no edema. neuro grossly intact   Plan: CAD:  3VD decreased EF  MRI per CM today  CABG Monday by Dr Dorris Fetch.   DM:  Continue insulin Anemia: stable on iron Chol:  Continue statin

## 2012-07-07 NOTE — Progress Notes (Addendum)
VASCULAR LAB PRELIMINARY  PRELIMINARY  PRELIMINARY  PRELIMINARY  Pre-op Cardiac Surgery  Carotid Findings:  Bilateral:  No evidence of hemodynamically significant internal carotid artery stenosis.   Vertebral artery flow is antegrade.    Thereasa Parkin, RVT 07/07/2012 11:58 AM    Upper Extremity Right Left  Brachial Pressures 121  Triphasic  126  Triphasic   Radial Waveforms Triphasic  Triphasic   Ulnar Waveforms Triphasic  Triphasic   Palmar Arch (Allen's Test) Doppler decreases with radial compression, normall with ulnar compression Doppler normal with radial compression, obliterates with ulnar compression     Lower  Extremity Right Left  Dorsalis Pedis 125  Triphasic  150 Triphasic   Anterior Tibial    Posterior Tibial 151 Triphasic  132  Triphasic   Ankle/Brachial Indices 1.2 1.19      Thereasa Parkin, RVT 07/08/2012 1:42 PM

## 2012-07-07 NOTE — Progress Notes (Addendum)
Pt had 9 beats vtach. Pt sleeping and asymptomatic. VSS. Levonne Spiller, Rn

## 2012-07-08 ENCOUNTER — Inpatient Hospital Stay (HOSPITAL_COMMUNITY): Payer: Medicare Other

## 2012-07-08 DIAGNOSIS — I2589 Other forms of chronic ischemic heart disease: Secondary | ICD-10-CM

## 2012-07-08 DIAGNOSIS — Z01810 Encounter for preprocedural cardiovascular examination: Secondary | ICD-10-CM

## 2012-07-08 LAB — GLUCOSE, CAPILLARY
Glucose-Capillary: 119 mg/dL — ABNORMAL HIGH (ref 70–99)
Glucose-Capillary: 126 mg/dL — ABNORMAL HIGH (ref 70–99)

## 2012-07-08 LAB — CBC
HCT: 30.3 % — ABNORMAL LOW (ref 36.0–46.0)
Hemoglobin: 9.9 g/dL — ABNORMAL LOW (ref 12.0–15.0)
RBC: 3.71 MIL/uL — ABNORMAL LOW (ref 3.87–5.11)

## 2012-07-08 LAB — PULMONARY FUNCTION TEST

## 2012-07-08 LAB — HEPARIN LEVEL (UNFRACTIONATED): Heparin Unfractionated: 0.57 IU/mL (ref 0.30–0.70)

## 2012-07-08 MED ORDER — ALPRAZOLAM 0.25 MG PO TABS: 0.2500 mg | ORAL_TABLET | ORAL | Status: DC | PRN

## 2012-07-08 MED ORDER — LISINOPRIL 2.5 MG PO TABS
2.5000 mg | ORAL_TABLET | Freq: Every day | ORAL | Status: DC
  Administered 2012-07-08 – 2012-07-10 (×3): 2.5 mg via ORAL
  Filled 2012-07-08 (×5): qty 1

## 2012-07-08 MED ORDER — ALBUTEROL SULFATE (5 MG/ML) 0.5% IN NEBU
2.5000 mg | INHALATION_SOLUTION | Freq: Once | RESPIRATORY_TRACT | Status: AC
  Administered 2012-07-08: 2.5 mg via RESPIRATORY_TRACT

## 2012-07-08 NOTE — Progress Notes (Signed)
Procedure(s) (LRB): CORONARY ARTERY BYPASS GRAFTING (CABG) (N/A) Subjective: No complaints  Objective: Vital signs in last 24 hours: Temp:  [98 F (36.7 C)-98.4 F (36.9 C)] 98 F (36.7 C) (05/16 1400) Pulse Rate:  [66-73] 73 (05/16 1400) Cardiac Rhythm:  [-] Normal sinus rhythm (05/16 1026) Resp:  [16-18] 16 (05/16 1400) BP: (110-127)/(48-78) 124/48 mmHg (05/16 1400) SpO2:  [97 %-100 %] 100 % (05/16 1400) Weight:  [223 lb 8 oz (101.379 kg)] 223 lb 8 oz (101.379 kg) (05/16 0500)  Hemodynamic parameters for last 24 hours:    Intake/Output from previous day: 05/15 0701 - 05/16 0700 In: 480 [P.O.:480] Out: -  Intake/Output this shift: Total I/O In: 720 [P.O.:720] Out: -   General appearance: alert and no distress Neurologic: intact Heart: regular rate and rhythm Lungs: clear to auscultation bilaterally  Lab Results:  Recent Labs  07/07/12 0545 07/08/12 0510  WBC 5.7 5.3  HGB 10.3* 9.9*  HCT 32.6* 30.3*  PLT 210 202   BMET:  Recent Labs  07/06/12 1130 07/07/12 0545  NA  --  139  K  --  4.2  CL  --  104  CO2  --  27  GLUCOSE 76 135*  BUN  --  13  CREATININE  --  0.84  CALCIUM  --  9.4    PT/INR: No results found for this basename: LABPROT, INR,  in the last 72 hours ABG No results found for this basename: phart, pco2, po2, hco3, tco2, acidbasedef, o2sat   CBG (last 3)   Recent Labs  07/07/12 2042 07/08/12 0735 07/08/12 1130  GLUCAP 137* 119* 126*   CARDIAC MR Impression:  1) Severe LVE with EF 34% mid and distal anterior wall, apical  and septal hypokinesis.  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

## 2012-07-08 NOTE — Progress Notes (Signed)
ANTICOAGULATION CONSULT NOTE - Follow Up Consult  Pharmacy Consult for Heparin Indication: 3 vessel CAD  Allergies  Allergen Reactions  . Demerol   . Percodan (Oxycodone-Aspirin)     Patient Measurements: Height: 5' 4.17" (163 cm) Weight: 223 lb 8 oz (101.379 kg) IBW/kg (Calculated) : 55.1 Heparin Dosing Weight: 78 kg  Vital Signs: Temp: 98.1 F (36.7 C) (05/16 0500) BP: 110/70 mmHg (05/16 0900) Pulse Rate: 70 (05/16 0900)  Labs:  Recent Labs  07/07/12 0010 07/07/12 0545 07/07/12 0800 07/08/12 0510  HGB  --  10.3*  --  9.9*  HCT  --  32.6*  --  30.3*  PLT  --  210  --  202  HEPARINUNFRC 0.46  --  0.75* 0.57  CREATININE  --  0.84  --   --     Estimated Creatinine Clearance: 69.3 ml/min (by C-G formula based on Cr of 0.84).  Assessment: On heparin for CAD while awaiting CABG on Monday, 07/11/12. Heparin is therapeutic. No bleeding noted.   Goal of Therapy:  Heparin level 0.3-0.7 units/ml Monitor platelets by anticoagulation protocol: Yes   Plan:   Cont heparin at 1050 units/hr F/u with daily level

## 2012-07-08 NOTE — Progress Notes (Signed)
Patient ID: Ashlee [name], female   DOB: 19-Nov-1940, 72 y.o.   MRN: [id number]    HPI: No chest pain or dyspnea; complains of left knee pain  . aspirin EC  81 mg Oral Daily  . atorvastatin  40 mg Oral QPC supper  . carvedilol  3.125 mg Oral BID  . exenatide  10 mcg Subcutaneous BID WC  . ferrous sulfate  325 mg Oral Daily  . insulin aspart  0-15 Units Subcutaneous TID WC  . insulin NPH  5 Units Subcutaneous QHS  . multivitamin with minerals  1 tablet Oral Daily  . sodium chloride  3 mL Intravenous Q12H  . vitamin B-12  1,000 mcg Oral Daily     Physical Exam: Well-developed obese in no acute distress.  Skin is warm and dry.  HEENT is normal.  Neck is supple.  Chest is clear to auscultation with normal expansion.  Cardiovascular exam is regular rate and rhythm. 2/6 systolic murmur Abdominal exam nontender or distended. No masses palpated. Right groin with no hematoma and no bruit Extremities show no edema. 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 CABG Monday by Dr Dorris Fetch.  Continue ASA, heparin, statin and coreg; add ACEI; will need echo 3 months after CABG; if EF < 35, ICD. 2 DM:  Continue insulin 3 Anemia: stable on iron 4 Chol:  Continue statin  5 HTN: advance meds as needed.

## 2012-07-09 DIAGNOSIS — I2510 Atherosclerotic heart disease of native coronary artery without angina pectoris: Secondary | ICD-10-CM

## 2012-07-09 LAB — HEPARIN LEVEL (UNFRACTIONATED): Heparin Unfractionated: 0.6 IU/mL (ref 0.30–0.70)

## 2012-07-09 LAB — SURGICAL PCR SCREEN: MRSA, PCR: NEGATIVE

## 2012-07-09 LAB — URINALYSIS, ROUTINE W REFLEX MICROSCOPIC
Ketones, ur: NEGATIVE mg/dL
Protein, ur: NEGATIVE mg/dL
Urobilinogen, UA: 1 mg/dL (ref 0.0–1.0)

## 2012-07-09 LAB — URINE MICROSCOPIC-ADD ON

## 2012-07-09 LAB — CBC
MCH: 27 pg (ref 26.0–34.0)
MCHC: 32.9 g/dL (ref 30.0–36.0)
Platelets: 202 10*3/uL (ref 150–400)
Platelets: 226 10*3/uL (ref 150–400)
RBC: 3.85 MIL/uL — ABNORMAL LOW (ref 3.87–5.11)
RBC: 3.97 MIL/uL (ref 3.87–5.11)
RDW: 16.4 % — ABNORMAL HIGH (ref 11.5–15.5)
WBC: 6.4 10*3/uL (ref 4.0–10.5)

## 2012-07-09 LAB — GLUCOSE, CAPILLARY
Glucose-Capillary: 101 mg/dL — ABNORMAL HIGH (ref 70–99)
Glucose-Capillary: 142 mg/dL — ABNORMAL HIGH (ref 70–99)

## 2012-07-09 LAB — BLOOD GAS, ARTERIAL
Acid-base deficit: 0.6 mmol/L (ref 0.0–2.0)
Bicarbonate: 23.5 mEq/L (ref 20.0–24.0)
Drawn by: 21338
FIO2: 0.21 %
O2 Saturation: 96.5 %
Patient temperature: 98.6
pO2, Arterial: 84.8 mmHg (ref 80.0–100.0)

## 2012-07-09 MED ORDER — METOPROLOL TARTRATE 12.5 MG HALF TABLET
12.5000 mg | ORAL_TABLET | Freq: Once | ORAL | Status: AC
  Administered 2012-07-11: 12.5 mg via ORAL
  Filled 2012-07-09: qty 1

## 2012-07-09 MED ORDER — CHLORHEXIDINE GLUCONATE 4 % EX LIQD
60.0000 mL | Freq: Once | CUTANEOUS | Status: DC
  Filled 2012-07-09: qty 60

## 2012-07-09 MED ORDER — TEMAZEPAM 15 MG PO CAPS: 15.0000 mg | ORAL_CAPSULE | Freq: Once | ORAL | Status: DC | PRN

## 2012-07-09 MED ORDER — BISACODYL 5 MG PO TBEC
5.0000 mg | DELAYED_RELEASE_TABLET | Freq: Once | ORAL | Status: AC
  Administered 2012-07-10: 5 mg via ORAL
  Filled 2012-07-09: qty 1

## 2012-07-09 MED ORDER — DIAZEPAM 5 MG PO TABS
5.0000 mg | ORAL_TABLET | Freq: Once | ORAL | Status: AC
  Administered 2012-07-11: 5 mg via ORAL
  Filled 2012-07-09: qty 1

## 2012-07-09 MED ORDER — CHLORHEXIDINE GLUCONATE 4 % EX LIQD
60.0000 mL | Freq: Once | CUTANEOUS | Status: AC
  Administered 2012-07-11: 4 via TOPICAL
  Filled 2012-07-09: qty 60

## 2012-07-09 NOTE — Progress Notes (Signed)
CARDIAC REHAB PHASE I   PRE:  Rate/Rhythm: 62  BP:  Supine:   Sitting: 104/62  Standing:    SaO2: 99 RA  MODE:  Ambulation: 500  ft   POST:  Rate/Rhythem: 64  BP:  Supine:  Sitting: 100/64  Standing:     SaO2: 100 Ambulated with assist x1 pace slow, balance good.  She is having more difficulty with her knee, states because she has had more visitors today and sitting more.  Feels better when she is ambulating.  Encouraged and supported, very positive attitude regarding upcoming surgery.  02:54-3:35pm   Jackey Loge

## 2012-07-09 NOTE — Progress Notes (Signed)
Patient ID: Ashlee [name], female   DOB: 05/28/1940, 72 y.o.   MRN: [id number]    HPI: No chest pain or dyspnea; complains of left knee pain  . aspirin EC  81 mg Oral Daily  . atorvastatin  40 mg Oral QPC supper  . carvedilol  3.125 mg Oral BID  . exenatide  10 mcg Subcutaneous BID WC  . ferrous sulfate  325 mg Oral Daily  . insulin aspart  0-15 Units Subcutaneous TID WC  . insulin NPH  5 Units Subcutaneous QHS  . lisinopril  2.5 mg Oral Daily  . multivitamin with minerals  1 tablet Oral Daily  . sodium chloride  3 mL Intravenous Q12H  . vitamin B-12  1,000 mcg Oral Daily     Physical Exam: Well-developed obese in no acute distress.  Skin is warm and dry.  HEENT is normal.  Neck is supple.  Chest is clear to auscultation with normal expansion.  Cardiovascular exam is regular rate and rhythm. Soft  systolic murmur Abdominal exam nontender or distended. No masses palpated. Right groin with no hematoma and no bruit Extremities show no edema. 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 CABG Monday by Dr Dorris Fetch.  Continue ASA, heparin, statin and coreg; add ACEI; will need echo 3 months after CABG; if EF < 35, ICD. 2 DM:  Continue insulin 3 Anemia: stable on iron 4 Chol:  Continue statin  5 HTN: advance meds as needed.  Vesta Mixer, Montez Hageman., MD, [facility] 07/09/2012, 8:00 AM Office - [phone] Pager [phone]

## 2012-07-09 NOTE — Progress Notes (Signed)
ANTICOAGULATION CONSULT NOTE - Follow Up Consult  Pharmacy Consult for Heparin Indication: 3 vessel CAD  Allergies  Allergen Reactions  . Demerol   . Percodan (Oxycodone-Aspirin)     Patient Measurements: Height: 5\' 3"  (160 cm) Weight: 221 lb 11.2 oz (100.562 kg) IBW/kg (Calculated) : 52.4 Heparin Dosing Weight: 78 kg  Vital Signs: Temp: 97.8 F (36.6 C) (05/17 0500) BP: 116/65 mmHg (05/17 0500) Pulse Rate: 66 (05/17 0500)  Labs:  Recent Labs  07/07/12 0545 07/07/12 0800 07/08/12 0510 07/09/12 0530  HGB 10.3*  --  9.9* 10.4*  HCT 32.6*  --  30.3* 31.6*  PLT 210  --  202 202  HEPARINUNFRC  --  0.75* 0.57 0.60  CREATININE 0.84  --   --   --     Estimated Creatinine Clearance: 67.5 ml/min (by C-G formula based on Cr of 0.84).  Assessment: On heparin for CAD while awaiting CABG on Monday, 07/11/12. Heparin is therapeutic. No complications noted.   Goal of Therapy:  Heparin level 0.3-0.7 units/ml Monitor platelets by anticoagulation protocol: Yes   Plan:   Cont heparin at 1050 units/hr F/u with daily level

## 2012-07-10 LAB — GLUCOSE, CAPILLARY
Glucose-Capillary: 130 mg/dL — ABNORMAL HIGH (ref 70–99)
Glucose-Capillary: 118 mg/dL — ABNORMAL HIGH (ref 70–99)

## 2012-07-10 LAB — COMPREHENSIVE METABOLIC PANEL
ALT: 26 U/L (ref 0–35)
AST: 35 U/L (ref 0–37)
Albumin: 3.3 g/dL — ABNORMAL LOW (ref 3.5–5.2)
Alkaline Phosphatase: 71 U/L (ref 39–117)
BUN: 15 mg/dL (ref 6–23)
Chloride: 103 mEq/L (ref 96–112)
Potassium: 4.1 mEq/L (ref 3.5–5.1)
Sodium: 139 mEq/L (ref 135–145)
Total Bilirubin: 0.4 mg/dL (ref 0.3–1.2)
Total Protein: 7 g/dL (ref 6.0–8.3)

## 2012-07-10 LAB — APTT: aPTT: 142 seconds — ABNORMAL HIGH (ref 24–37)

## 2012-07-10 LAB — CBC
HCT: 31.5 % — ABNORMAL LOW (ref 36.0–46.0)
Hemoglobin: 10.1 g/dL — ABNORMAL LOW (ref 12.0–15.0)
MCV: 82.5 fL (ref 78.0–100.0)
RDW: 16.5 % — ABNORMAL HIGH (ref 11.5–15.5)
WBC: 6.5 10*3/uL (ref 4.0–10.5)

## 2012-07-10 LAB — PROTIME-INR
INR: 1 (ref 0.00–1.49)
Prothrombin Time: 13.1 seconds (ref 11.6–15.2)

## 2012-07-10 MED ORDER — HEPARIN SODIUM (PORCINE) 1000 UNIT/ML IJ SOLN
INTRAMUSCULAR | Status: DC
  Filled 2012-07-10: qty 30

## 2012-07-10 MED ORDER — DEXMEDETOMIDINE HCL IN NACL 400 MCG/100ML IV SOLN
0.1000 ug/kg/h | INTRAVENOUS | Status: DC
  Filled 2012-07-10: qty 100

## 2012-07-10 MED ORDER — CEFUROXIME SODIUM 750 MG IJ SOLR
750.0000 mg | INTRAMUSCULAR | Status: DC
  Filled 2012-07-10: qty 750

## 2012-07-10 MED ORDER — DOPAMINE-DEXTROSE 3.2-5 MG/ML-% IV SOLN
2.0000 ug/kg/min | INTRAVENOUS | Status: DC
  Filled 2012-07-10: qty 250

## 2012-07-10 MED ORDER — VANCOMYCIN HCL 10 G IV SOLR
1500.0000 mg | INTRAVENOUS | Status: AC
  Administered 2012-07-11: 1500 mg via INTRAVENOUS
  Filled 2012-07-10: qty 1500

## 2012-07-10 MED ORDER — PLASMA-LYTE 148 IV SOLN
INTRAVENOUS | Status: AC
  Administered 2012-07-11: 09:00:00
  Filled 2012-07-10: qty 2.5

## 2012-07-10 MED ORDER — DEXTROSE 5 % IV SOLN
1.5000 g | INTRAVENOUS | Status: AC
  Administered 2012-07-11: .75 g via INTRAVENOUS
  Administered 2012-07-11: 1.5 g via INTRAVENOUS
  Filled 2012-07-10: qty 1.5

## 2012-07-10 MED ORDER — MAGNESIUM SULFATE 50 % IJ SOLN
40.0000 meq | INTRAMUSCULAR | Status: DC
  Filled 2012-07-10: qty 10

## 2012-07-10 MED ORDER — PHENYLEPHRINE HCL 10 MG/ML IJ SOLN
30.0000 ug/min | INTRAVENOUS | Status: DC
  Filled 2012-07-10: qty 2

## 2012-07-10 MED ORDER — SODIUM CHLORIDE 0.9 % IV SOLN
INTRAVENOUS | Status: DC
  Filled 2012-07-10: qty 1

## 2012-07-10 MED ORDER — POTASSIUM CHLORIDE 2 MEQ/ML IV SOLN
80.0000 meq | INTRAVENOUS | Status: DC
  Filled 2012-07-10: qty 40

## 2012-07-10 MED ORDER — SODIUM CHLORIDE 0.9 % IV SOLN
INTRAVENOUS | Status: AC
  Administered 2012-07-11: 69.8 mL/h via INTRAVENOUS
  Administered 2012-07-11: 14 mL/h via INTRAVENOUS
  Filled 2012-07-10: qty 40

## 2012-07-10 MED ORDER — NITROGLYCERIN IN D5W 200-5 MCG/ML-% IV SOLN
2.0000 ug/min | INTRAVENOUS | Status: DC
  Filled 2012-07-10: qty 250

## 2012-07-10 MED ORDER — EPINEPHRINE HCL 1 MG/ML IJ SOLN
0.5000 ug/min | INTRAVENOUS | Status: DC
  Filled 2012-07-10: qty 4

## 2012-07-10 NOTE — Progress Notes (Signed)
ANTICOAGULATION CONSULT NOTE - Follow Up Consult  Pharmacy Consult for Heparin Indication: 3 vessel CAD  Allergies  Allergen Reactions  . Demerol   . Percodan (Oxycodone-Aspirin)     Patient Measurements: Height: 5\' 3"  (160 cm) Weight: 225 lb 9.6 oz (102.331 kg) IBW/kg (Calculated) : 52.4 Heparin Dosing Weight: 78 kg  Vital Signs: Temp: 98 F (36.7 C) (05/18 0639) Temp src: Oral (05/18 0639) BP: 114/71 mmHg (05/18 0639) Pulse Rate: 72 (05/18 0639)  Labs:  Recent Labs  07/08/12 0510 07/09/12 0530 07/09/12 1510 07/10/12 0442  HGB 9.9* 10.4* 10.5* 10.1*  HCT 30.3* 31.6* 33.1* 31.5*  PLT 202 202 226 199  APTT  --   --   --  142*  LABPROT  --   --   --  13.1  INR  --   --   --  1.00  HEPARINUNFRC 0.57 0.60  --  0.54  CREATININE  --   --   --  0.83    Estimated Creatinine Clearance: 69 ml/min (by C-G formula based on Cr of 0.83).  Assessment: On heparin for CAD while awaiting CABG on Monday, 07/11/12. Heparin is therapeutic. CBC stable  Goal of Therapy:  Heparin level 0.3-0.7 units/ml Monitor platelets by anticoagulation protocol: Yes   Plan:   Cont heparin at 1050 units/hr F/u with daily level

## 2012-07-10 NOTE — Progress Notes (Signed)
Patient ID: Ashlee [name], female   DOB: [birth year], 72 y.o.   MRN: [id number]    HPI: No chest pain or dyspnea, still bothered by knee pain.   Marland Kitchen aspirin EC  81 mg Oral Daily  . atorvastatin  40 mg Oral QPC supper  . bisacodyl  5 mg Oral Once  . carvedilol  3.125 mg Oral BID  . chlorhexidine  60 mL Topical Once  . [START ON 07/11/2012] chlorhexidine  60 mL Topical Once  . [START ON 07/11/2012] diazepam  5 mg Oral Once  . exenatide  10 mcg Subcutaneous BID WC  . ferrous sulfate  325 mg Oral Daily  . insulin aspart  0-15 Units Subcutaneous TID WC  . insulin NPH  5 Units Subcutaneous QHS  . lisinopril  2.5 mg Oral Daily  . [START ON 07/11/2012] metoprolol tartrate  12.5 mg Oral Once  . multivitamin with minerals  1 tablet Oral Daily  . sodium chloride  3 mL Intravenous Q12H  . vitamin B-12  1,000 mcg Oral Daily     Physical Exam: Well-developed obese in no acute distress.  Skin is warm and dry.  HEENT is normal.  Neck is supple.  Chest is clear to auscultation with normal expansion.  Cardiovascular exam is regular rate and rhythm. Soft  systolic murmur Abdominal exam nontender or distended. No masses palpated. Right groin with no hematoma and no bruit Extremities show no edema. 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 CABG Monday by Dr Dorris Fetch.  Continue ASA, heparin, statin and coreg; add ACEI; will need echo 3 months after CABG; if EF < 35, ICD. 2 DM:  Continue insulin 3 Anemia: stable on iron 4 Chol:  Continue statin  5 HTN: advance meds as needed. 6. Knee pain: continue tylenol for now   Alvia Grove., MD, [facility] 07/10/2012, 7:51 AM Office - [phone] Pager [phone]

## 2012-07-11 ENCOUNTER — Encounter (HOSPITAL_COMMUNITY)
Admission: AD | Disposition: A | Discharge status: Home Health | Payer: Self-pay | Source: Ambulatory Visit | Attending: Thoracic Surgery (Cardiothoracic Vascular Surgery)

## 2012-07-11 ENCOUNTER — Encounter (HOSPITAL_COMMUNITY): Payer: Self-pay | Admitting: Certified Registered Nurse Anesthetist

## 2012-07-11 ENCOUNTER — Inpatient Hospital Stay (HOSPITAL_COMMUNITY): Payer: Medicare Other

## 2012-07-11 ENCOUNTER — Inpatient Hospital Stay (HOSPITAL_COMMUNITY): Payer: Medicare Other | Admitting: Certified Registered Nurse Anesthetist

## 2012-07-11 DIAGNOSIS — I2510 Atherosclerotic heart disease of native coronary artery without angina pectoris: Secondary | ICD-10-CM

## 2012-07-11 HISTORY — PX: CORONARY ARTERY BYPASS GRAFT: SHX141

## 2012-07-11 LAB — POCT I-STAT 4, (NA,K, GLUC, HGB,HCT)
Glucose, Bld: 129 mg/dL — ABNORMAL HIGH (ref 70–99)
Glucose, Bld: 100 mg/dL — ABNORMAL HIGH (ref 70–99)
Glucose, Bld: 156 mg/dL — ABNORMAL HIGH (ref 70–99)
HCT: 22 % — ABNORMAL LOW (ref 36.0–46.0)
HCT: 22 % — ABNORMAL LOW (ref 36.0–46.0)
HCT: 23 % — ABNORMAL LOW (ref 36.0–46.0)
Hemoglobin: 9.9 g/dL — ABNORMAL LOW (ref 12.0–15.0)
Hemoglobin: 8.8 g/dL — ABNORMAL LOW (ref 12.0–15.0)
Hemoglobin: 7.5 g/dL — ABNORMAL LOW (ref 12.0–15.0)
Hemoglobin: 7.5 g/dL — ABNORMAL LOW (ref 12.0–15.0)
Potassium: 3.8 mEq/L (ref 3.5–5.1)
Potassium: 3.5 mEq/L (ref 3.5–5.1)
Potassium: 3.5 mEq/L (ref 3.5–5.1)
Potassium: 4.4 mEq/L (ref 3.5–5.1)
Potassium: 4.1 mEq/L (ref 3.5–5.1)
Sodium: 140 mEq/L (ref 135–145)
Sodium: 136 mEq/L (ref 135–145)
Sodium: 137 mEq/L (ref 135–145)

## 2012-07-11 LAB — CBC
HCT: 30.1 % — ABNORMAL LOW (ref 36.0–46.0)
Hemoglobin: 9.9 g/dL — ABNORMAL LOW (ref 12.0–15.0)
Hemoglobin: 9.8 g/dL — ABNORMAL LOW (ref 12.0–15.0)
MCH: 26.5 pg (ref 26.0–34.0)
MCH: 26.5 pg (ref 26.0–34.0)
MCHC: 32.1 g/dL (ref 30.0–36.0)
MCV: 82.4 fL (ref 78.0–100.0)
Platelets: 199 10*3/uL (ref 150–400)
Platelets: 125 10*3/uL — ABNORMAL LOW (ref 150–400)
RBC: 3.72 MIL/uL — ABNORMAL LOW (ref 3.87–5.11)
RBC: 3.7 MIL/uL — ABNORMAL LOW (ref 3.87–5.11)
WBC: 9.6 10*3/uL (ref 4.0–10.5)

## 2012-07-11 LAB — GLUCOSE, CAPILLARY
Glucose-Capillary: 127 mg/dL — ABNORMAL HIGH (ref 70–99)
Glucose-Capillary: 111 mg/dL — ABNORMAL HIGH (ref 70–99)
Glucose-Capillary: 108 mg/dL — ABNORMAL HIGH (ref 70–99)
Glucose-Capillary: 114 mg/dL — ABNORMAL HIGH (ref 70–99)
Glucose-Capillary: 145 mg/dL — ABNORMAL HIGH (ref 70–99)
Glucose-Capillary: 125 mg/dL — ABNORMAL HIGH (ref 70–99)

## 2012-07-11 LAB — POCT I-STAT 3, ART BLOOD GAS (G3+)
Acid-base deficit: 2 mmol/L (ref 0.0–2.0)
Bicarbonate: 22.8 mEq/L (ref 20.0–24.0)
Bicarbonate: 21.2 mEq/L (ref 20.0–24.0)
O2 Saturation: 100 %
O2 Saturation: 97 %
Patient temperature: 35
TCO2: 22 mmol/L (ref 0–100)
pCO2 arterial: 32 mmHg — ABNORMAL LOW (ref 35.0–45.0)
pH, Arterial: 7.421 (ref 7.350–7.450)
pO2, Arterial: 310 mmHg — ABNORMAL HIGH (ref 80.0–100.0)

## 2012-07-11 LAB — PROTIME-INR
INR: 1.28 (ref 0.00–1.49)
Prothrombin Time: 15.7 seconds — ABNORMAL HIGH (ref 11.6–15.2)

## 2012-07-11 LAB — CREATININE, SERUM
Creatinine, Ser: 0.73 mg/dL (ref 0.50–1.10)
GFR calc Af Amer: >90 mL/min (ref >90)
GFR calc non Af Amer: 83 mL/min — ABNORMAL LOW (ref >90)

## 2012-07-11 LAB — HEPARIN LEVEL (UNFRACTIONATED): Heparin Unfractionated: 0.59 IU/mL (ref 0.30–0.70)

## 2012-07-11 LAB — POCT I-STAT, CHEM 8
BUN: 9 mg/dL (ref 6–23)
Creatinine, Ser: 0.9 mg/dL (ref 0.50–1.10)
Potassium: 4.2 mEq/L (ref 3.5–5.1)
Sodium: 141 mEq/L (ref 135–145)

## 2012-07-11 LAB — URINE CULTURE

## 2012-07-11 LAB — PREPARE RBC (CROSSMATCH)

## 2012-07-11 LAB — POCT I-STAT GLUCOSE: Operator id: 230421

## 2012-07-11 LAB — MAGNESIUM: Magnesium: 3.2 mg/dL — ABNORMAL HIGH (ref 1.5–2.5)

## 2012-07-11 LAB — APTT: aPTT: 39 seconds — ABNORMAL HIGH (ref 24–37)

## 2012-07-11 SURGERY — CORONARY ARTERY BYPASS GRAFTING (CABG)
Anesthesia: General | Site: Chest | Wound class: Clean

## 2012-07-11 MED ORDER — OXYCODONE HCL 5 MG PO TABS
5.0000 mg | ORAL_TABLET | ORAL | Status: DC | PRN
  Administered 2012-07-12 – 2012-07-17 (×3): 5 mg via ORAL
  Administered 2012-07-17: 10 mg via ORAL
  Filled 2012-07-11 (×4): qty 1
  Filled 2012-07-11: qty 2

## 2012-07-11 MED ORDER — METOPROLOL TARTRATE 25 MG/10 ML ORAL SUSPENSION
12.5000 mg | Freq: Two times a day (BID) | ORAL | Status: DC
  Filled 2012-07-11 (×3): qty 5

## 2012-07-11 MED ORDER — PANTOPRAZOLE SODIUM 40 MG PO TBEC
40.0000 mg | DELAYED_RELEASE_TABLET | Freq: Every day | ORAL | Status: DC
  Administered 2012-07-13 – 2012-07-18 (×6): 40 mg via ORAL
  Filled 2012-07-11 (×7): qty 1

## 2012-07-11 MED ORDER — ACETAMINOPHEN 160 MG/5ML PO SOLN
975.0000 mg | Freq: Four times a day (QID) | ORAL | Status: AC
  Filled 2012-07-11: qty 40.6

## 2012-07-11 MED ORDER — 0.9 % SODIUM CHLORIDE (POUR BTL) OPTIME
TOPICAL | Status: DC | PRN
  Administered 2012-07-11: 1000 mL

## 2012-07-11 MED ORDER — SODIUM CHLORIDE 0.9 % IV SOLN
INTRAVENOUS | Status: DC
  Administered 2012-07-11: 1.6 [IU]/h via INTRAVENOUS
  Filled 2012-07-11: qty 1

## 2012-07-11 MED ORDER — PROPOFOL 10 MG/ML IV BOLUS
INTRAVENOUS | Status: DC | PRN
  Administered 2012-07-11: 80 mg via INTRAVENOUS

## 2012-07-11 MED ORDER — SODIUM CHLORIDE 0.9 % IV SOLN: INTRAVENOUS | Status: DC

## 2012-07-11 MED ORDER — SODIUM CHLORIDE 0.9 % IJ SOLN
OROMUCOSAL | Status: DC | PRN
  Administered 2012-07-11: 09:00:00 via TOPICAL

## 2012-07-11 MED ORDER — SODIUM CHLORIDE 0.9 % IJ SOLN
3.0000 mL | Freq: Two times a day (BID) | INTRAMUSCULAR | Status: DC
  Administered 2012-07-12 (×2): 3 mL via INTRAVENOUS

## 2012-07-11 MED ORDER — ROCURONIUM BROMIDE 100 MG/10ML IV SOLN
INTRAVENOUS | Status: DC | PRN
  Administered 2012-07-11: 50 mg via INTRAVENOUS
  Administered 2012-07-11: 40 mg via INTRAVENOUS
  Administered 2012-07-11: 60 mg via INTRAVENOUS

## 2012-07-11 MED ORDER — DOCUSATE SODIUM 100 MG PO CAPS
200.0000 mg | ORAL_CAPSULE | Freq: Every day | ORAL | Status: DC
  Administered 2012-07-12 – 2012-07-13 (×2): 200 mg via ORAL
  Filled 2012-07-11 (×7): qty 2

## 2012-07-11 MED ORDER — HEMOSTATIC AGENTS (NO CHARGE) OPTIME
TOPICAL | Status: DC | PRN
  Administered 2012-07-11: 1 via TOPICAL

## 2012-07-11 MED ORDER — LACTATED RINGERS IV SOLN: 500.0000 mL | Freq: Once | INTRAVENOUS | Status: AC | PRN

## 2012-07-11 MED ORDER — MIDAZOLAM HCL 2 MG/2ML IJ SOLN: 2.0000 mg | INTRAMUSCULAR | Status: DC | PRN

## 2012-07-11 MED ORDER — POTASSIUM CHLORIDE 10 MEQ/50ML IV SOLN: 10.0000 meq | INTRAVENOUS | Status: AC

## 2012-07-11 MED ORDER — GLYCOPYRROLATE 0.2 MG/ML IJ SOLN
INTRAMUSCULAR | Status: DC | PRN
  Administered 2012-07-11 (×2): 0.2 mg via INTRAVENOUS

## 2012-07-11 MED ORDER — ASPIRIN EC 325 MG PO TBEC
325.0000 mg | DELAYED_RELEASE_TABLET | Freq: Every day | ORAL | Status: DC
  Administered 2012-07-12 – 2012-07-18 (×7): 325 mg via ORAL
  Filled 2012-07-11 (×7): qty 1

## 2012-07-11 MED ORDER — FAMOTIDINE IN NACL 20-0.9 MG/50ML-% IV SOLN
20.0000 mg | Freq: Two times a day (BID) | INTRAVENOUS | Status: AC
  Administered 2012-07-11 (×2): 20 mg via INTRAVENOUS
  Filled 2012-07-11: qty 50

## 2012-07-11 MED ORDER — LIDOCAINE HCL (CARDIAC) 20 MG/ML IV SOLN
INTRAVENOUS | Status: DC | PRN
  Administered 2012-07-11: 60 mg via INTRAVENOUS

## 2012-07-11 MED ORDER — HEPARIN SODIUM (PORCINE) 1000 UNIT/ML IJ SOLN
INTRAMUSCULAR | Status: DC | PRN
  Administered 2012-07-11: 23 mL via INTRAVENOUS
  Administered 2012-07-11: 2 mL via INTRAVENOUS

## 2012-07-11 MED ORDER — AZITHROMYCIN 1 % OP SOLN: 1.0000 [drp] | Freq: Two times a day (BID) | OPHTHALMIC | Status: DC

## 2012-07-11 MED ORDER — MIDAZOLAM HCL 5 MG/5ML IJ SOLN
INTRAMUSCULAR | Status: DC | PRN
  Administered 2012-07-11: 4 mg via INTRAVENOUS
  Administered 2012-07-11 (×3): 2 mg via INTRAVENOUS

## 2012-07-11 MED ORDER — METOPROLOL TARTRATE 12.5 MG HALF TABLET
12.5000 mg | ORAL_TABLET | Freq: Two times a day (BID) | ORAL | Status: DC
  Filled 2012-07-11 (×3): qty 1

## 2012-07-11 MED ORDER — SODIUM CHLORIDE 0.9 % IV SOLN: 250.0000 mL | INTRAVENOUS | Status: DC

## 2012-07-11 MED ORDER — VANCOMYCIN HCL IN DEXTROSE 1-5 GM/200ML-% IV SOLN
1000.0000 mg | Freq: Once | INTRAVENOUS | Status: AC
  Administered 2012-07-11: 1000 mg via INTRAVENOUS
  Filled 2012-07-11: qty 200

## 2012-07-11 MED ORDER — LACTATED RINGERS IV SOLN
INTRAVENOUS | Status: DC | PRN
  Administered 2012-07-11: 07:00:00 via INTRAVENOUS

## 2012-07-11 MED ORDER — DEXTROSE 5 % IV SOLN
1.5000 g | Freq: Two times a day (BID) | INTRAVENOUS | Status: AC
  Administered 2012-07-11 – 2012-07-13 (×4): 1.5 g via INTRAVENOUS
  Filled 2012-07-11 (×4): qty 1.5

## 2012-07-11 MED ORDER — ACETAMINOPHEN 10 MG/ML IV SOLN
1000.0000 mg | Freq: Once | INTRAVENOUS | Status: AC
  Administered 2012-07-11: 1000 mg via INTRAVENOUS
  Filled 2012-07-11: qty 100

## 2012-07-11 MED ORDER — METOCLOPRAMIDE HCL 5 MG/ML IJ SOLN
10.0000 mg | Freq: Four times a day (QID) | INTRAMUSCULAR | Status: AC
  Administered 2012-07-11 – 2012-07-12 (×4): 10 mg via INTRAVENOUS
  Filled 2012-07-11 (×4): qty 2

## 2012-07-11 MED ORDER — DOPAMINE-DEXTROSE 3.2-5 MG/ML-% IV SOLN
0.0000 ug/kg/min | INTRAVENOUS | Status: DC
  Administered 2012-07-11: 3 ug/kg/min via INTRAVENOUS

## 2012-07-11 MED ORDER — LACTATED RINGERS IV SOLN
INTRAVENOUS | Status: DC | PRN
  Administered 2012-07-11 (×3): via INTRAVENOUS

## 2012-07-11 MED ORDER — PROTAMINE SULFATE 10 MG/ML IV SOLN
INTRAVENOUS | Status: DC | PRN
  Administered 2012-07-11 (×2): 30 mg via INTRAVENOUS
  Administered 2012-07-11: 40 mg via INTRAVENOUS
  Administered 2012-07-11: 10 mg via INTRAVENOUS
  Administered 2012-07-11: 30 mg via INTRAVENOUS
  Administered 2012-07-11: 50 mg via INTRAVENOUS

## 2012-07-11 MED ORDER — SODIUM CHLORIDE 0.9 % IV SOLN
200.0000 ug | INTRAVENOUS | Status: DC | PRN
  Administered 2012-07-11: 0.2 ug/kg/h via INTRAVENOUS

## 2012-07-11 MED ORDER — DOPAMINE-DEXTROSE 3.2-5 MG/ML-% IV SOLN
INTRAVENOUS | Status: DC | PRN
  Administered 2012-07-11: 5 ug/kg/min via INTRAVENOUS

## 2012-07-11 MED ORDER — INSULIN REGULAR BOLUS VIA INFUSION
0.0000 [IU] | Freq: Three times a day (TID) | INTRAVENOUS | Status: DC
  Filled 2012-07-11: qty 10

## 2012-07-11 MED ORDER — BISACODYL 10 MG RE SUPP: 10.0000 mg | Freq: Every day | RECTAL | Status: DC

## 2012-07-11 MED ORDER — MORPHINE SULFATE 2 MG/ML IJ SOLN
2.0000 mg | INTRAMUSCULAR | Status: DC | PRN
  Administered 2012-07-12 (×2): 2 mg via INTRAVENOUS
  Filled 2012-07-11 (×2): qty 1

## 2012-07-11 MED ORDER — MAGNESIUM SULFATE 40 MG/ML IJ SOLN
4.0000 g | Freq: Once | INTRAMUSCULAR | Status: AC
  Administered 2012-07-11: 4 g via INTRAVENOUS

## 2012-07-11 MED ORDER — DEXMEDETOMIDINE HCL IN NACL 200 MCG/50ML IV SOLN: 0.1000 ug/kg/h | INTRAVENOUS | Status: DC

## 2012-07-11 MED ORDER — SODIUM CHLORIDE 0.9 % IV SOLN
10.0000 mg | INTRAVENOUS | Status: DC | PRN
  Administered 2012-07-11: 40 ug/min via INTRAVENOUS

## 2012-07-11 MED ORDER — ACETAMINOPHEN 500 MG PO TABS
1000.0000 mg | ORAL_TABLET | Freq: Four times a day (QID) | ORAL | Status: AC
  Administered 2012-07-12 – 2012-07-16 (×12): 1000 mg via ORAL
  Filled 2012-07-11 (×20): qty 2

## 2012-07-11 MED ORDER — SODIUM CHLORIDE 0.45 % IV SOLN
INTRAVENOUS | Status: DC
  Administered 2012-07-11: 15:00:00 via INTRAVENOUS

## 2012-07-11 MED ORDER — LACTATED RINGERS IV SOLN: INTRAVENOUS | Status: DC

## 2012-07-11 MED ORDER — ONDANSETRON HCL 4 MG/2ML IJ SOLN
4.0000 mg | Freq: Four times a day (QID) | INTRAMUSCULAR | Status: DC | PRN
  Administered 2012-07-13: 4 mg via INTRAVENOUS
  Filled 2012-07-11: qty 2

## 2012-07-11 MED ORDER — NITROGLYCERIN IN D5W 200-5 MCG/ML-% IV SOLN
INTRAVENOUS | Status: DC | PRN
  Administered 2012-07-11: 16.6 ug/min via INTRAVENOUS

## 2012-07-11 MED ORDER — FENTANYL CITRATE 0.05 MG/ML IJ SOLN
INTRAMUSCULAR | Status: DC | PRN
  Administered 2012-07-11: 100 ug via INTRAVENOUS
  Administered 2012-07-11 (×4): 150 ug via INTRAVENOUS
  Administered 2012-07-11: 400 ug via INTRAVENOUS
  Administered 2012-07-11 (×2): 100 ug via INTRAVENOUS
  Administered 2012-07-11: 250 ug via INTRAVENOUS
  Administered 2012-07-11 (×2): 100 ug via INTRAVENOUS

## 2012-07-11 MED ORDER — INSULIN REGULAR HUMAN 100 UNIT/ML IJ SOLN
100.0000 [IU] | INTRAMUSCULAR | Status: DC | PRN
  Administered 2012-07-11: 2.1 [IU]/h via INTRAVENOUS

## 2012-07-11 MED ORDER — BISACODYL 5 MG PO TBEC
10.0000 mg | DELAYED_RELEASE_TABLET | Freq: Every day | ORAL | Status: DC
  Administered 2012-07-12 – 2012-07-13 (×2): 10 mg via ORAL
  Filled 2012-07-11: qty 2

## 2012-07-11 MED ORDER — SODIUM CHLORIDE 0.9 % IV SOLN
5.0000 g | INTRAVENOUS | Status: DC
  Filled 2012-07-11: qty 20

## 2012-07-11 MED ORDER — ASPIRIN 81 MG PO CHEW: 324.0000 mg | CHEWABLE_TABLET | Freq: Every day | ORAL | Status: DC

## 2012-07-11 MED ORDER — ALBUMIN HUMAN 5 % IV SOLN
250.0000 mL | INTRAVENOUS | Status: AC | PRN
  Administered 2012-07-11 (×3): 250 mL via INTRAVENOUS
  Filled 2012-07-11: qty 500

## 2012-07-11 MED ORDER — PHENYLEPHRINE HCL 10 MG/ML IJ SOLN: 0.0000 ug/min | INTRAVENOUS | Status: DC

## 2012-07-11 MED ORDER — METOPROLOL TARTRATE 1 MG/ML IV SOLN: 2.5000 mg | INTRAVENOUS | Status: DC | PRN

## 2012-07-11 MED ORDER — MORPHINE SULFATE 2 MG/ML IJ SOLN: 1.0000 mg | INTRAMUSCULAR | Status: AC | PRN

## 2012-07-11 MED ORDER — AMINOCAPROIC ACID 250 MG/ML IV SOLN: INTRAVENOUS | Status: DC | PRN

## 2012-07-11 MED ORDER — NITROGLYCERIN IN D5W 200-5 MCG/ML-% IV SOLN: 0.0000 ug/min | INTRAVENOUS | Status: DC

## 2012-07-11 MED ORDER — DEXMEDETOMIDINE HCL IN NACL 200 MCG/50ML IV SOLN
0.4000 ug/kg/h | INTRAVENOUS | Status: DC
  Filled 2012-07-11: qty 50

## 2012-07-11 MED ORDER — SODIUM CHLORIDE 0.9 % IJ SOLN: 3.0000 mL | INTRAMUSCULAR | Status: DC | PRN

## 2012-07-11 SURGICAL SUPPLY — 92 items
ATTRACTOMAT 16X20 MAGNETIC DRP (DRAPES) ×2 IMPLANT
BAG DECANTER FOR FLEXI CONT (MISCELLANEOUS) ×2 IMPLANT
BANDAGE ELASTIC 4 VELCRO ST LF (GAUZE/BANDAGES/DRESSINGS) ×4 IMPLANT
BANDAGE ELASTIC 6 VELCRO ST LF (GAUZE/BANDAGES/DRESSINGS) ×4 IMPLANT
BANDAGE GAUZE ELAST BULKY 4 IN (GAUZE/BANDAGES/DRESSINGS) ×4 IMPLANT
BASKET HEART (ORDER IN 25'S) (MISCELLANEOUS) ×1
BASKET HEART (ORDER IN 25S) (MISCELLANEOUS) ×1 IMPLANT
BENZOIN TINCTURE PRP APPL 2/3 (GAUZE/BANDAGES/DRESSINGS) ×2 IMPLANT
BLADE STERNUM SYSTEM 6 (BLADE) ×2 IMPLANT
BLADE SURG 11 STRL SS (BLADE) ×2 IMPLANT
CANISTER SUCTION 2500CC (MISCELLANEOUS) ×2 IMPLANT
CANNULA EZ GLIDE AORTIC 21FR (CANNULA) ×2 IMPLANT
CATH CPB KIT HENDRICKSON (MISCELLANEOUS) ×2 IMPLANT
CATH ROBINSON RED A/P 18FR (CATHETERS) ×2 IMPLANT
CATH THORACIC 36FR (CATHETERS) ×2 IMPLANT
CATH THORACIC 36FR RT ANG (CATHETERS) ×2 IMPLANT
CLIP FOGARTY SPRING 6M (CLIP) ×2 IMPLANT
CLIP TI MEDIUM 24 (CLIP) IMPLANT
CLIP TI WIDE RED SMALL 24 (CLIP) ×8 IMPLANT
CLOTH BEACON ORANGE TIMEOUT ST (SAFETY) ×2 IMPLANT
CLSR STERI-STRIP ANTIMIC 1/2X4 (GAUZE/BANDAGES/DRESSINGS) ×2 IMPLANT
COVER SURGICAL LIGHT HANDLE (MISCELLANEOUS) ×2 IMPLANT
CRADLE DONUT ADULT HEAD (MISCELLANEOUS) ×2 IMPLANT
DRAPE CARDIOVASCULAR INCISE (DRAPES) ×1
DRAPE SLUSH/WARMER DISC (DRAPES) ×2 IMPLANT
DRAPE SRG 135X102X78XABS (DRAPES) ×1 IMPLANT
DRSG COVADERM 4X14 (GAUZE/BANDAGES/DRESSINGS) ×2 IMPLANT
ELECT REM PT RETURN 9FT ADLT (ELECTROSURGICAL) ×4
ELECTRODE REM PT RTRN 9FT ADLT (ELECTROSURGICAL) ×2 IMPLANT
GLOVE BIO SURGEON STRL SZ 6 (GLOVE) ×8 IMPLANT
GLOVE BIO SURGEON STRL SZ 6.5 (GLOVE) ×12 IMPLANT
GLOVE BIOGEL PI IND STRL 6 (GLOVE) ×1 IMPLANT
GLOVE BIOGEL PI IND STRL 6.5 (GLOVE) ×6 IMPLANT
GLOVE BIOGEL PI INDICATOR 6 (GLOVE) ×1
GLOVE BIOGEL PI INDICATOR 6.5 (GLOVE) ×6
GLOVE EUDERMIC 7 POWDERFREE (GLOVE) ×6 IMPLANT
GOWN PREVENTION PLUS XLARGE (GOWN DISPOSABLE) ×8 IMPLANT
GOWN STRL NON-REIN LRG LVL3 (GOWN DISPOSABLE) ×16 IMPLANT
HEMOSTAT POWDER SURGIFOAM 1G (HEMOSTASIS) ×6 IMPLANT
HEMOSTAT SURGICEL 2X14 (HEMOSTASIS) ×2 IMPLANT
INSERT FOGARTY XLG (MISCELLANEOUS) IMPLANT
KIT BASIN OR (CUSTOM PROCEDURE TRAY) ×2 IMPLANT
KIT ROOM TURNOVER OR (KITS) ×2 IMPLANT
KIT SUCTION CATH 14FR (SUCTIONS) ×4 IMPLANT
KIT VASOVIEW W/TROCAR VH 2000 (KITS) ×2 IMPLANT
MARKER GRAFT CORONARY BYPASS (MISCELLANEOUS) ×6 IMPLANT
NS IRRIG 1000ML POUR BTL (IV SOLUTION) ×12 IMPLANT
PACK OPEN HEART (CUSTOM PROCEDURE TRAY) ×2 IMPLANT
PAD ARMBOARD 7.5X6 YLW CONV (MISCELLANEOUS) ×4 IMPLANT
PENCIL BUTTON HOLSTER BLD 10FT (ELECTRODE) ×2 IMPLANT
PUNCH AORTIC ROTATE 4.0MM (MISCELLANEOUS) IMPLANT
PUNCH AORTIC ROTATE 4.5MM 8IN (MISCELLANEOUS) ×2 IMPLANT
PUNCH AORTIC ROTATE 5MM 8IN (MISCELLANEOUS) IMPLANT
SET CARDIOPLEGIA MPS 5001102 (MISCELLANEOUS) ×2 IMPLANT
SPONGE GAUZE 4X4 12PLY (GAUZE/BANDAGES/DRESSINGS) ×6 IMPLANT
SPONGE LAP 18X18 X RAY DECT (DISPOSABLE) ×2 IMPLANT
SPONGE LAP 4X18 X RAY DECT (DISPOSABLE) ×2 IMPLANT
SUT BONE WAX W31G (SUTURE) ×2 IMPLANT
SUT MNCRL AB 4-0 PS2 18 (SUTURE) ×4 IMPLANT
SUT PROLENE 3 0 SH DA (SUTURE) ×2 IMPLANT
SUT PROLENE 4 0 RB 1 (SUTURE) ×2
SUT PROLENE 4 0 SH DA (SUTURE) IMPLANT
SUT PROLENE 4-0 RB1 .5 CRCL 36 (SUTURE) ×1 IMPLANT
SUT PROLENE 6 0 C 1 30 (SUTURE) ×4 IMPLANT
SUT PROLENE 7 0 BV1 MDA (SUTURE) ×6 IMPLANT
SUT PROLENE 8 0 BV175 6 (SUTURE) IMPLANT
SUT SILK  1 MH (SUTURE)
SUT SILK 1 MH (SUTURE) IMPLANT
SUT STEEL 6MS V (SUTURE) IMPLANT
SUT STEEL STERNAL CCS#1 18IN (SUTURE) IMPLANT
SUT STEEL SZ 6 DBL 3X14 BALL (SUTURE) ×6 IMPLANT
SUT VIC AB 1 CTX 36 (SUTURE) ×6
SUT VIC AB 1 CTX36XBRD ANBCTR (SUTURE) ×3 IMPLANT
SUT VIC AB 2-0 CT1 27 (SUTURE) ×3
SUT VIC AB 2-0 CT1 TAPERPNT 27 (SUTURE) ×3 IMPLANT
SUT VIC AB 2-0 CTX 27 (SUTURE) IMPLANT
SUT VIC AB 3-0 SH 27 (SUTURE)
SUT VIC AB 3-0 SH 27X BRD (SUTURE) IMPLANT
SUT VIC AB 3-0 X1 27 (SUTURE) IMPLANT
SUT VICRYL 4-0 PS2 18IN ABS (SUTURE) IMPLANT
SUTURE E-PAK OPEN HEART (SUTURE) ×2 IMPLANT
SYSTEM SAHARA CHEST DRAIN ATS (WOUND CARE) ×2 IMPLANT
TAPE CLOTH SURG 4X10 WHT LF (GAUZE/BANDAGES/DRESSINGS) ×2 IMPLANT
TAPE PAPER 2X10 WHT MICROPORE (GAUZE/BANDAGES/DRESSINGS) ×2 IMPLANT
TOWEL OR 17X24 6PK STRL BLUE (TOWEL DISPOSABLE) ×4 IMPLANT
TOWEL OR 17X26 10 PK STRL BLUE (TOWEL DISPOSABLE) ×4 IMPLANT
TRAY FOLEY IC TEMP SENS 14FR (CATHETERS) ×2 IMPLANT
TUBE FEEDING 8FR 16IN STR KANG (MISCELLANEOUS) ×2 IMPLANT
TUBE SUCT INTRACARD DLP 20F (MISCELLANEOUS) ×2 IMPLANT
TUBING INSUFFLATION 10FT LAP (TUBING) ×2 IMPLANT
UNDERPAD 30X30 INCONTINENT (UNDERPADS AND DIAPERS) ×2 IMPLANT
WATER STERILE IRR 1000ML POUR (IV SOLUTION) ×4 IMPLANT

## 2012-07-11 NOTE — Progress Notes (Signed)
UR Completed.  Ashlee [name] [phone] 07/11/2012  

## 2012-07-11 NOTE — Interval H&P Note (Signed)
History and Physical Interval Note:  07/11/2012 7:28 AM  Ashlee [name]  has presented today for surgery, with the diagnosis of CAD  The various methods of treatment have been discussed with the patient and family. After consideration of risks, benefits and other options for treatment, the patient has consented to  Procedure(s): CORONARY ARTERY BYPASS GRAFTING (CABG) (N/A) as a surgical intervention .  The patient's history has been reviewed, patient examined, no change in status, stable for surgery.  I have reviewed the patient's chart and labs.  Questions were answered to the patient's satisfaction.     Tianne Plott C

## 2012-07-11 NOTE — Anesthesia Procedure Notes (Signed)
Procedure Name: Intubation Date/Time: 07/11/2012 7:54 AM Performed by: Rogelia Boga Pre-anesthesia Checklist: Patient identified, Emergency Drugs available, Suction available, Patient being monitored and Timeout performed Patient Re-evaluated:Patient Re-evaluated prior to inductionOxygen Delivery Method: Circle system utilized Preoxygenation: Pre-oxygenation with 100% oxygen Intubation Type: IV induction Ventilation: Mask ventilation without difficulty and Oral airway inserted - appropriate to patient size Laryngoscope Size: Mac and 4 Grade View: Grade I Tube type: Oral Tube size: 8.0 mm Number of attempts: 1 Airway Equipment and Method: Stylet Placement Confirmation: ETT inserted through vocal cords under direct vision,  positive ETCO2 and breath sounds checked- equal and bilateral Secured at: 22 cm Tube secured with: Tape Dental Injury: Teeth and Oropharynx as per pre-operative assessment

## 2012-07-11 NOTE — Anesthesia Postprocedure Evaluation (Signed)
  Anesthesia Post-op Note  Patient: Ashlee [name]  Procedure(s) Performed: Procedure(s) with comments: CORONARY ARTERY BYPASS GRAFTING (CABG) (N/A) - x4, using left internal mammary and right greater saphenous vein.   Patient Location: SICU  Anesthesia Type:General  Level of Consciousness: sedated  Airway and Oxygen Therapy: ventilated  Post-op Pain: mild  Post-op Assessment: Post-op Vital signs reviewed  Post-op Vital Signs: Reviewed  Complications: No apparent anesthesia complications

## 2012-07-11 NOTE — Progress Notes (Signed)
  Echocardiogram 2D Echocardiogram has been performed.  Jorje Guild 07/11/2012, 9:42 AM

## 2012-07-11 NOTE — Progress Notes (Signed)
[facility] BRIEF SICU PROGRESS NOTE  Day of Surgery  S/P Procedure(s) (LRB): CORONARY ARTERY BYPASS GRAFTING (CABG) (N/A)   Sedated on vent NSR, AAI paced for rate BP stable on dopamine @ 3  PA pressures low w/ C.I. 1.9 Chest tube output low UOP excellent Labs okay  Plan: Continue routine early postop  Ashlee [name] H 07/11/2012 8:05 PM

## 2012-07-11 NOTE — H&P (View-Only) (Signed)
Reason for Consult:Severe 3 vessel CAD Referring Physician: Drs. McAlhany and Crenshaw  Ashlee [name] is an 71 y.o. female.  HPI: 71 yo woman with a history of CAD(stent to LAD after MI in 1998), HTN, hyperlipidemia, and diabetes. She recently has been experiencing SOB with exertion and sometimes eating. She denies any chest pain. She has been feeling tired and has been fatiguing easily for the past 6-8 months. She had a nuclear study in March which showed her EF to be 25-30% with an anteroapical infarct ( EF was 45-50%) in 2012. She was scheduled for elective catheterization which was done today. It showed severe 3 vessel CAD with a totally occluded LAD.    Past Medical History  Diagnosis Date  . CAD (coronary artery disease) 1998    stent post heart attack  . DM type 2 (diabetes mellitus, type 2)   . Hypertension   . Hyperlipidemia     Past Surgical History  Procedure Laterality Date  . Breast reduction surgery    . Tubal ligation    . Lad stent      post heart attack  . Tonsillectomy      Family History  Problem Relation Age of Onset  . Heart attack Mother     MI at age 59  . Breast cancer Sister   . Throat cancer Brother   . Lung cancer Brother   . Prostate cancer Father   . Hypertension Mother     Social History:  reports that she has never smoked. She does not have any smokeless tobacco history on file. Her alcohol and drug histories are not on file.  Allergies:  Allergies  Allergen Reactions  . Demerol   . Percodan (Oxycodone-Aspirin)     Medications:  Prior to Admission:  Prescriptions prior to admission  Medication Sig Dispense Refill  . aspirin 81 MG tablet Take 81 mg by mouth daily.        . atorvastatin (LIPITOR) 80 MG tablet Take 40 mg by mouth daily.      . azithromycin (AZASITE) 1 % ophthalmic solution Place 1 drop into both eyes 2 (two) times daily.      . carvedilol (COREG) 3.125 MG tablet Take 1 tablet (3.125 mg total) by mouth 2 (two) times  daily.  180 tablet  3  . ergocalciferol (VITAMIN D2) 50000 UNITS capsule Take 50,000 Units by mouth once a week.       . exenatide (BYETTA 10 MCG PEN) 10 MCG/0.04ML SOLN bid      . Ferrous Sulfate (IRON) 325 (65 FE) MG TABS Take 1 tablet by mouth daily.      . glimepiride (AMARYL) 4 MG tablet Take 2 mg by mouth daily before breakfast.      . glucose blood (SMARTEST TEST) test strip 1 each by Other route 2 (two) times daily. Use as instructed      . hydrochlorothiazide (HYDRODIURIL) 25 MG tablet Take 12.5 mg by mouth daily.      . insulin NPH (HUMULIN N,NOVOLIN N) 100 UNIT/ML injection Inject 5 Units into the skin at bedtime.       . Multiple Vitamin (MULTIVITAMIN) capsule Take 1 capsule by mouth daily.        . Multiple Vitamins-Minerals (CENTRUM SILVER ADULT 50+ PO) Take 1 tablet by mouth daily.      . Olmesartan Medoxomil (BENICAR PO) Take 1 tablet by mouth daily.      . pioglitazone (ACTOS) 30 MG tablet Take 30 mg   by mouth daily.        . vitamin B-12 (CYANOCOBALAMIN) 1000 MCG tablet Take 1,000 mcg by mouth daily.      . metFORMIN (GLUCOPHAGE) 1000 MG tablet Take 1,000 mg by mouth 2 (two) times daily with a meal.        Results for orders placed during the hospital encounter of 07/06/12 (from the past 48 hour(s))  POCT I-STAT GLUCOSE     Status: None   Collection Time    07/06/12 11:30 AM      Result Value Range   Operator id [id number]     Glucose, Bld 76  70 - 99 mg/dL    Dg Chest 2 View  07/04/2012   *RADIOLOGY REPORT*  Clinical Data: Diabetes, CAD  CHEST - 2 VIEW  Comparison: None.  Findings: Normal cardiac silhouette.  There is central venous pulmonary congestion and mild interstitial edema as well as mild perihilar air space disease.  Findings are most suggestive of pulmonary edema/congestive heart failure.  No pleural fluid is present.  No pneumothorax  IMPRESSION: Central venous congestion and mild perihilar air space disease suggests pulmonary edema.  Cannot exclude atypical  pneumonia.   Original Report Authenticated By: Stewart Edmunds, M.D.    Review of Systems  Constitutional: Positive for malaise/fatigue. Negative for fever, chills and weight loss.  Respiratory: Positive for cough, sputum production and shortness of breath (with exertion and sometimes eating). Negative for hemoptysis.   Cardiovascular: Positive for leg swelling. Negative for chest pain, palpitations and orthopnea.  Gastrointestinal: Positive for heartburn.  Genitourinary: Negative for dysuria.  Musculoskeletal: Positive for joint pain (needs L TKR).  Neurological: Negative.   All other systems reviewed and are negative.   Blood pressure 114/56, pulse 72, temperature 97.2 F (36.2 C), temperature source Oral, resp. rate 18, SpO2 100.00%. Physical Exam  Vitals reviewed. Constitutional: She is oriented to person, place, and time. No distress.  Morbidly obese  HENT:  Head: Normocephalic and atraumatic.  Eyes: EOM are normal. Pupils are equal, round, and reactive to light.  Neck: Normal range of motion. Neck supple. No thyromegaly present.  No carotid bruits  Cardiovascular: Normal rate, regular rhythm and intact distal pulses.  Exam reveals gallop (+ S4?). Exam reveals no friction rub.   No murmur heard. Respiratory: Effort normal and breath sounds normal.  GI: Soft. There is no tenderness.  Musculoskeletal: She exhibits no edema.  Lymphadenopathy:    She has no cervical adenopathy.  Neurological: She is alert and oriented to person, place, and time. No cranial nerve deficit.  Skin: Skin is warm and dry.   CARDIAC CATHETERIZATION Hemodynamic Findings:  Central aortic pressure: 105/48  Left ventricular pressure: 106/21/22  Angiographic Findings:  Left main: No obstructive disease.  Left Anterior Descending Artery: Large caliber vessel that courses to the apex. Severe calcification noted in the proximal and mid vessel. The proximal vessel has a stented segment. There is 100% total  occlusion within the proximal portion of the stent. The mid and distal vessel is seen to fill faintly from left to left collaterals. The diagonal branch is moderate in caliber with diffuse 50% proximal stenosis followed by a bifurcation. The inferior sub-branch of the diagonal is small in caliber with 90% ostial stenosis. The superior sub-branch of the diagonal is patent.  Circumflex Artery: Moderate caliber vessel with small caliber first obtuse marginal branch followed by a moderate caliber second diagonal branch. Severe calcification noted in the proximal vessel. The mid AV groove Circumflex leading   into the second OM branch has a long segment of 80% stenosis.  Right Coronary Artery: Moderate caliber dominant vessel with severe calcification noted in the proximal/mid and distal segments. The proximal vessel has diffuse 50% stenosis. The mid vessel has a 99% stenosis just before a long segment of moderate 60% stenosis. The distal vessel has a focal 99% stenosis just before the bifurcation into the PDA and posterolateral branches.  Left Ventricular Angiogram: Deferred.  Impression:  1. Severe triple vessel CAD  2. Severe LV systolic dysfunction with suggestion of anterior wall scar on the stress myoview.  3. Exertional dyspnea  Recommendations: Diabetic patient with severe 3 vessel CAD and severe LV systolic dysfunction. Will admit to telemetry. Cardiac MRI in am to assess for viability. Will consult CT surgery for possible CABG. She does not have favorable vessels for PCI with diffuse calcification in all vessels, CTO of the proximal LAD and high grade bifurcational disease in the RCA.   Assessment/Plan: 71 yo woman with multiple CRF and a history of CAD presents with exertional dyspnea. W/u shows severe 3 vessel CAD with severe LV dysfunction.  CABG is indicated for survival benefit and relief of symptoms. She is relatively high risk given obesity, DM and severe LV dysfunction  I have discussed  with her the general nature of the procedure, the need for general anesthesia, and the incisions to be used. I discussed the expected hospital stay, overall recovery and short and long term outcomes. She understands the risks of surgery include, but are not limited to, death, stroke, MI, DVT/PE, bleeding, possible need for transfusion, infections, cardiac arrhythmias, and other organ system dysfunction including respiratory, renal, or GI complications. She accepts these risks and agrees to proceed.  Plan CABG Monday 5/19- 1st available  HENDRICKSON,STEVEN C 07/06/2012, 3:38 PM      

## 2012-07-11 NOTE — Brief Op Note (Addendum)
07/06/2012 - 07/11/2012  1:10 PM  PATIENT:  Ashlee [name]  72 y.o. female  PRE-OPERATIVE DIAGNOSIS:   CAD  POST-OPERATIVE DIAGNOSIS:  CAD  PROCEDURE:    CORONARY ARTERY BYPASS GRAFTING x 4 (LIMA-LAD, SVG-D-OM1, SVG-PD)  ENDOSCOPIC VEIN HARVEST RIGHT LEG  SURGEON:  Loreli Slot, MD  ASSISTANT: Coral Ceo, PA-C  ANESTHESIA:   general  PATIENT CONDITION:  ICU - intubated and hemodynamically stable.  PRE-OPERATIVE WEIGHT: 101 kg   XC= 76 min CPB = 120 min  Off CPB 1st attempt with dopamine at 5 mcg/kg/min  Vein fair quality, LIMA good quality, LAD diffusely diseased

## 2012-07-11 NOTE — Anesthesia Postprocedure Evaluation (Signed)
  Anesthesia Post-op Note  Patient: Ashlee [name]  Procedure(s) Performed: Procedure(s) with comments: CORONARY ARTERY BYPASS GRAFTING (CABG) (N/A) - x4, using left internal mammary and right greater saphenous vein.   Patient Location: PACU and SICU  Anesthesia Type:General  Level of Consciousness: sedated  Airway and Oxygen Therapy: Patient remains intubated per anesthesia plan  Post-op Pain: mild  Post-op Assessment: Post-op Vital signs reviewed  Post-op Vital Signs: Reviewed  Complications: No apparent anesthesia complications

## 2012-07-11 NOTE — Anesthesia Preprocedure Evaluation (Addendum)
Anesthesia Evaluation  Patient identified by MRN, date of birth, ID band Patient awake    Reviewed: Allergy & Precautions, H&P , NPO status , Patient's Chart, lab work & pertinent test results  History of Anesthesia Complications (+) PONV  Airway Mallampati: II      Dental  (+) Partial Upper and Dental Advisory Given   Pulmonary shortness of breath,  breath sounds clear to auscultation        Cardiovascular hypertension, Pt. on medications and Pt. on home beta blockers + CAD and + Past MI Rhythm:Regular Rate:Normal     Neuro/Psych    GI/Hepatic negative GI ROS, Neg liver ROS,   Endo/Other  diabetes, Type 2, Oral Hypoglycemic Agents  Renal/GU negative Renal ROS     Musculoskeletal   Abdominal   Peds  Hematology   Anesthesia Other Findings   Reproductive/Obstetrics                         Anesthesia Physical Anesthesia Plan  ASA: IV  Anesthesia Plan: General   Post-op Pain Management:    Induction: Intravenous  Airway Management Planned: Oral ETT  Additional Equipment: Arterial line, CVP, PA Cath and TEE  Intra-op Plan:   Post-operative Plan: Post-operative intubation/ventilation  Informed Consent: I have reviewed the patients History and Physical, chart, labs and discussed the procedure including the risks, benefits and alternatives for the proposed anesthesia with the patient or authorized representative who has indicated his/her understanding and acceptance.   Dental advisory given  Plan Discussed with: CRNA and Anesthesiologist  Anesthesia Plan Comments:        Anesthesia Quick Evaluation

## 2012-07-11 NOTE — Transfer of Care (Signed)
Immediate Anesthesia Transfer of Care Note  Patient: Ashlee [name]  Procedure(s) Performed: Procedure(s) with comments: CORONARY ARTERY BYPASS GRAFTING (CABG) (N/A) - x4, using left internal mammary and right greater saphenous vein.   Patient Location: SICU  Anesthesia Type:General  Level of Consciousness: sedated and unresponsive  Airway & Oxygen Therapy: Patient remains intubated per anesthesia plan and Patient placed on Ventilator (see vital sign flow sheet for setting)  Post-op Assessment: Report given to PACU RN and Post -op Vital signs reviewed and stable  Post vital signs: Reviewed and stable  Complications: No apparent anesthesia complications

## 2012-07-11 NOTE — Preoperative (Addendum)
Beta Blockers   Reason not to administer Beta Blockers:Not Applicable, Metoprolol given at 0553 this am

## 2012-07-11 NOTE — Progress Notes (Signed)
Pt too sleepy to attempt wean at this time. Pt will open eyes on command but will close them immediately and pt needs constant stimulation in order to have her squeeze my hand or move her feet. Pt otherwise stable. Will continue to monitor and wean when appropriate.

## 2012-07-12 ENCOUNTER — Encounter (HOSPITAL_COMMUNITY): Payer: Self-pay | Admitting: Thoracic Surgery (Cardiothoracic Vascular Surgery)

## 2012-07-12 ENCOUNTER — Inpatient Hospital Stay (HOSPITAL_COMMUNITY): Payer: Medicare Other

## 2012-07-12 LAB — POCT I-STAT 3, ART BLOOD GAS (G3+)
Acid-base deficit: 5 mmol/L — ABNORMAL HIGH (ref 0.0–2.0)
Bicarbonate: 20 mEq/L (ref 20.0–24.0)
Bicarbonate: 21.5 mEq/L (ref 20.0–24.0)
O2 Saturation: 98 %
O2 Saturation: 98 %
Patient temperature: 37.6
TCO2: 23 mmol/L (ref 0–100)
pCO2 arterial: 39 mmHg (ref 35.0–45.0)
pH, Arterial: 7.35 (ref 7.350–7.450)
pO2, Arterial: 107 mmHg — ABNORMAL HIGH (ref 80.0–100.0)

## 2012-07-12 LAB — CBC
HCT: 30.4 % — ABNORMAL LOW (ref 36.0–46.0)
Hemoglobin: 9.9 g/dL — ABNORMAL LOW (ref 12.0–15.0)
MCH: 26.9 pg (ref 26.0–34.0)
MCHC: 32.9 g/dL (ref 30.0–36.0)
MCHC: 32.9 g/dL (ref 30.0–36.0)
MCV: 81.8 fL (ref 78.0–100.0)
Platelets: 132 10*3/uL — ABNORMAL LOW (ref 150–400)
Platelets: 126 10*3/uL — ABNORMAL LOW (ref 150–400)
RBC: 3.68 MIL/uL — ABNORMAL LOW (ref 3.87–5.11)
RDW: 16.1 % — ABNORMAL HIGH (ref 11.5–15.5)
WBC: 12.6 10*3/uL — ABNORMAL HIGH (ref 4.0–10.5)

## 2012-07-12 LAB — BASIC METABOLIC PANEL
BUN: 10 mg/dL (ref 6–23)
GFR calc Af Amer: >90 mL/min (ref >90)
GFR calc non Af Amer: 84 mL/min — ABNORMAL LOW (ref >90)
Potassium: 3.9 mEq/L (ref 3.5–5.1)

## 2012-07-12 LAB — POCT I-STAT, CHEM 8
BUN: 11 mg/dL (ref 6–23)
Calcium, Ion: 1.22 mmol/L (ref 1.13–1.30)
Creatinine, Ser: 0.9 mg/dL (ref 0.50–1.10)
Glucose, Bld: 156 mg/dL — ABNORMAL HIGH (ref 70–99)
TCO2: 23 mmol/L (ref 0–100)

## 2012-07-12 LAB — GLUCOSE, CAPILLARY
Glucose-Capillary: 103 mg/dL — ABNORMAL HIGH (ref 70–99)
Glucose-Capillary: 103 mg/dL — ABNORMAL HIGH (ref 70–99)
Glucose-Capillary: 98 mg/dL (ref 70–99)
Glucose-Capillary: 102 mg/dL — ABNORMAL HIGH (ref 70–99)
Glucose-Capillary: 98 mg/dL (ref 70–99)
Glucose-Capillary: 98 mg/dL (ref 70–99)
Glucose-Capillary: 130 mg/dL — ABNORMAL HIGH (ref 70–99)
Glucose-Capillary: 124 mg/dL — ABNORMAL HIGH (ref 70–99)

## 2012-07-12 LAB — MAGNESIUM: Magnesium: 2.7 mg/dL — ABNORMAL HIGH (ref 1.5–2.5)

## 2012-07-12 MED ORDER — OLMESARTAN 10 MG HALF TABLET
10.0000 mg | ORAL_TABLET | Freq: Every day | ORAL | Status: DC
  Administered 2012-07-13 – 2012-07-15 (×3): 10 mg via ORAL
  Filled 2012-07-12 (×4): qty 1

## 2012-07-12 MED ORDER — POTASSIUM CHLORIDE 10 MEQ/50ML IV SOLN
10.0000 meq | INTRAVENOUS | Status: AC
  Administered 2012-07-12 (×4): 10 meq via INTRAVENOUS

## 2012-07-12 MED ORDER — CARVEDILOL 6.25 MG PO TABS
6.2500 mg | ORAL_TABLET | Freq: Two times a day (BID) | ORAL | Status: DC
  Administered 2012-07-12 – 2012-07-16 (×10): 6.25 mg via ORAL
  Filled 2012-07-12 (×13): qty 1

## 2012-07-12 MED ORDER — GLIMEPIRIDE 2 MG PO TABS
2.0000 mg | ORAL_TABLET | Freq: Every day | ORAL | Status: DC
  Administered 2012-07-12 – 2012-07-18 (×7): 2 mg via ORAL
  Filled 2012-07-12 (×8): qty 1

## 2012-07-12 MED ORDER — INSULIN ASPART 100 UNIT/ML ~~LOC~~ SOLN
0.0000 [IU] | SUBCUTANEOUS | Status: DC
  Administered 2012-07-12 (×2): 2 [IU] via SUBCUTANEOUS

## 2012-07-12 MED ORDER — ENOXAPARIN SODIUM 40 MG/0.4ML ~~LOC~~ SOLN
40.0000 mg | Freq: Every day | SUBCUTANEOUS | Status: DC
  Administered 2012-07-12 – 2012-07-17 (×6): 40 mg via SUBCUTANEOUS
  Filled 2012-07-12 (×7): qty 0.4

## 2012-07-12 MED ORDER — INSULIN DETEMIR 100 UNIT/ML ~~LOC~~ SOLN
30.0000 [IU] | Freq: Every day | SUBCUTANEOUS | Status: DC
  Administered 2012-07-12: 30 [IU] via SUBCUTANEOUS
  Filled 2012-07-12 (×2): qty 0.3

## 2012-07-12 MED ORDER — FUROSEMIDE 10 MG/ML IJ SOLN
40.0000 mg | Freq: Once | INTRAMUSCULAR | Status: AC
  Administered 2012-07-12: 40 mg via INTRAVENOUS

## 2012-07-12 MED ORDER — PIOGLITAZONE HCL 30 MG PO TABS
30.0000 mg | ORAL_TABLET | Freq: Every day | ORAL | Status: DC
  Administered 2012-07-12 – 2012-07-18 (×7): 30 mg via ORAL
  Filled 2012-07-12 (×7): qty 1

## 2012-07-12 MED FILL — Lidocaine HCl IV Inj 20 MG/ML: INTRAVENOUS | Qty: 5 | Status: AC

## 2012-07-12 MED FILL — Heparin Sodium (Porcine) Inj 1000 Unit/ML: INTRAMUSCULAR | Qty: 10 | Status: AC

## 2012-07-12 MED FILL — Sodium Chloride IV Soln 0.9%: INTRAVENOUS | Qty: 1000 | Status: AC

## 2012-07-12 MED FILL — Electrolyte-R (PH 7.4) Solution: INTRAVENOUS | Qty: 3000 | Status: AC

## 2012-07-12 MED FILL — Sodium Chloride Irrigation Soln 0.9%: Qty: 3000 | Status: AC

## 2012-07-12 MED FILL — Heparin Sodium (Porcine) Inj 1000 Unit/ML: INTRAMUSCULAR | Qty: 30 | Status: AC

## 2012-07-12 MED FILL — Mannitol IV Soln 20%: INTRAVENOUS | Qty: 500 | Status: AC

## 2012-07-12 MED FILL — Magnesium Sulfate Inj 50%: INTRAMUSCULAR | Qty: 10 | Status: AC

## 2012-07-12 MED FILL — Sodium Bicarbonate IV Soln 8.4%: INTRAVENOUS | Qty: 50 | Status: AC

## 2012-07-12 MED FILL — Potassium Chloride Inj 2 mEq/ML: INTRAVENOUS | Qty: 40 | Status: AC

## 2012-07-12 MED FILL — Dexmedetomidine HCl IV Soln 200 MCG/2ML: INTRAVENOUS | Qty: 2 | Status: AC

## 2012-07-12 NOTE — Progress Notes (Signed)
POD # 1 CABG  Comfortable  BP 134/68  Pulse 88  Temp(Src) 97.7 F (36.5 C) (Oral)  Resp 24  Ht 5\' 3"  (1.6 m)  Wt 233 lb 11 oz (106 kg)  BMI 41.41 kg/m2  SpO2 99%   Intake/Output Summary (Last 24 hours) at 07/12/12 1751 Last data filed at 07/12/12 1600  Gross per 24 hour  Intake 1859.2 ml  Output   2479 ml  Net -619.8 ml    On 2L [place]  K 4.6 HCT 30

## 2012-07-12 NOTE — Procedures (Signed)
Extubation Procedure Note  Patient Details:   Name: Ashlee [name] DOB: November 17, 1940 MRN: [id number]   Airway Documentation:     Evaluation  O2 sats: stable throughout Complications: No apparent complications Patient did tolerate procedure well. Bilateral Breath Sounds: Diminished    NIF-20, FVC 350. Pt able to breath around cuff. Okay to extubate per Dr. Cornelius Moras. Pt extubated per protocol. Pt placed on 4L [place] with no complications. Pt able to vocalize well.  Fredrich Birks 07/12/2012, 12:58 AM

## 2012-07-12 NOTE — Progress Notes (Signed)
1 Day Post-Op Procedure(s) (LRB): CORONARY ARTERY BYPASS GRAFTING (CABG) (N/A) Subjective: Denies pain and nausea   Objective: Vital signs in last 24 hours: Temp:  [95 F (35 C)-100 F (37.8 C)] 99.9 F (37.7 C) (05/20 0715) Pulse Rate:  [80-112] 90 (05/20 0715) Cardiac Rhythm:  [-] Ventricular paced (05/20 0400) Resp:  [0-27] 24 (05/20 0715) BP: (78-125)/(42-96) 102/57 mmHg (05/20 0700) SpO2:  [64 %-100 %] 99 % (05/20 0715) Arterial Line BP: (81-163)/(45-71) 149/61 mmHg (05/20 0715) FiO2 (%):  [40 %-50 %] 40 % (05/19 2357) Weight:  [222 lb 10.6 oz (101 kg)-233 lb 11 oz (106 kg)] 233 lb 11 oz (106 kg) (05/20 0500)  Hemodynamic parameters for last 24 hours: PAP: (30-62)/(13-34) 58/31 mmHg CO:  [3.4 L/min-4.7 L/min] 3.6 L/min CI:  [1.7 L/min/m2-2.3 L/min/m2] 1.8 L/min/m2  Intake/Output from previous day: 05/19 0701 - 05/20 0700 In: 6399.3 [I.V.:4449.3; Blood:600; IV Piggyback:1350] Out: 6209 [Urine:4230; Blood:1565; Chest Tube:414] Intake/Output this shift:    General appearance: a little lethargic Neurologic: no focal deficit Heart: regular rate and rhythm Lungs: diminished breath sounds bibasilar Abdomen: normal findings: soft, non-tender  Lab Results:  Recent Labs  07/11/12 2105 07/11/12 2108 07/12/12 0357  WBC 9.6  --  12.6*  HGB 9.8* 10.5* 10.0*  HCT 29.9* 31.0* 30.4*  PLT 125*  --  132*   BMET:  Recent Labs  07/10/12 0442  07/11/12 2108 07/12/12 0357  NA 139  < > 141 139  K 4.1  < > 4.2 3.9  CL 103  --  110 109  CO2 24  --   --  18*  GLUCOSE 138*  < > 106* 109*  BUN 15  --  9 10  CREATININE 0.83  < > 0.90 0.71  CALCIUM 9.6  --   --  8.5  < > = values in this interval not displayed.  PT/INR:  Recent Labs  07/11/12 1500  LABPROT 15.7*  INR 1.28   ABG    Component Value Date/Time   PHART 7.339* 07/12/2012 0204   HCO3 20.0 07/12/2012 0204   TCO2 21 07/12/2012 0204   ACIDBASEDEF 5.0* 07/12/2012 0204   O2SAT 98.0 07/12/2012 0204   CBG (last  3)   Recent Labs  07/12/12 0502 07/12/12 0618 07/12/12 0650  GLUCAP 103* 98 102*    Assessment/Plan: S/P Procedure(s) (LRB): CORONARY ARTERY BYPASS GRAFTING (CABG) (N/A) POD # 1 CABG CV- hypertensive, otherwise stable  RESP- bibasilar atelectasis- IS  RENAL- volume overloaded diurese- lytes, creatinine OK  CBG well controlled- transition to levimir + SSI  Dc CT  OOB, ambulate   LOS: 6 days    Ashlee [name] C 07/12/2012

## 2012-07-12 NOTE — Progress Notes (Signed)
MD notified of pt TV of 350 and all other wean parameters that are WNL. MD was okay with extubation and follow up gas 1 hr post extubation. Will continue to monitor. All other pt vitals are WNL and pt currently stable. 1 hr post extubation gas was WNL. Pt resting comfortably.

## 2012-07-12 NOTE — Op Note (Signed)
NAMETAJ, ARTEAGA NO.:  [id number]  MEDICAL RECORD NO.:  [id number]  LOCATION:  2305                         FACILITY:  [facility]  PHYSICIAN:  Salvatore Decent. Dorris Fetch, M.D.DATE OF BIRTH:  Jan 19, 1941  DATE OF PROCEDURE:  07/11/2012 DATE OF DISCHARGE:                              OPERATIVE REPORT   PREOPERATIVE DIAGNOSIS:  Severe three-vessel coronary disease with severe left ventricular dysfunction.  POSTOPERATIVE DIAGNOSIS:  Severe three-vessel coronary disease with severe left ventricular dysfunction.  PROCEDURE:  Median sternotomy, extracorporeal circulation, coronary artery bypass grafting x4 (left internal mammary artery to left anterior descending, saphenous vein graft to posterior descending, sequential saphenous vein graft to first diagonal and obtuse marginal 2), endoscopic vein harvest, right leg.  SURGEON:  Salvatore Decent. Dorris Fetch, M.D.  ASSISTANT:  Coral Ceo, P.A.  ANESTHESIA:  General.  FINDINGS:  Transesophageal echocardiography showed severe left ventricular dysfunction with apical and distal septal akinesis.  There was a patent foramen ovale.  Vein fair quality.  Mammary good quality.  LAD diffusely diseased, but good quality at the site of anastomosis.  Postbypass, TEE showed some improvement in left ventricular function in the setting of dopamine infusion.  CLINICAL NOTE:  Ms. Haymaker is a 72 year old woman with a history of coronary disease, who has been experiencing exertional shortness of breath.  She had a nuclear study, which showed an ejection fraction of 25%-30% with an anterior apical infarct.  On cardiac catheterization, she was found to have severe three-vessel disease with a totally occluded LAD.  Cardiac MRI was done, which did show subendocardial scar in the anterior and apical walls.  The patient was advised to undergo coronary artery bypass grafting.  The indications, risks, benefits, and alternatives were discussed in  detail with the patient.  She understood and accepted the risks and agreed to proceed.  OPERATIVE NOTE:  Ms. Splinter was brought to the preoperative holding area on Jul 11, 2012.  There, Anesthesia placed a Swan-Ganz catheter and arterial blood pressure monitoring line.  She was taken to the operating room and given intravenous antibiotics.  She was anesthetized and intubated.  A Foley catheter was placed.  Transesophageal echocardiography was performed.  Please refer to the anesthesiologist's dictated note for full details, but it did show severe left ventricular dysfunction with anterior, apical and distal septal akinesis.  There was cardiomegaly.  There was no significant valvular pathology.  There was a patent foramen ovale.  It was elected not to repair the patent foramen ovale because of reports of an increased stroke rate with repair of incidentally noted PFOs.  The chest, abdomen, and legs then were prepped and draped in the usual sterile fashion.  A median sternotomy was performed, and the left internal mammary artery was harvested using standard technique. Simultaneously, incision was made in the medial aspect of the right leg at the knee.  The saphenous vein was identified.  It was very densely adherent to the surrounding tissues, and there was some evidence of varicosities.  The left leg was explored with an incision at the same level as well as one below the knee, but vein could not be identified in either one of those sites, therefore the  vein was harvested from the right leg.  This was a slow, tedious and difficult process.  2000 units of heparin was administered during the vein harvest along with the mammary artery harvest.  After harvesting the vein, the vein was of adequate caliber.  There were densely adherent varicose branches, but the vein itself had minimal varicosities, and was acceptable for use as a bypass graft.  After harvesting the conduits, the remainder of  the full heparin dose was given.  The pericardium was opened.  The ascending aorta was inspected.  There was no palpable atherosclerotic disease.  The aorta was cannulated via concentric 2-0 Ethibond pledgeted pursestring sutures.  A dual-stage venous cannula was placed via pursestring suture in the right atrial appendage. After confirming adequate anticoagulation with ACT measurement, cardiopulmonary bypass was instituted, and the patient was cooled to 32 degrees Celsius.  The coronary arteries were inspected, and anastomotic sites were chosen.  The conduits were inspected and cut to length.  A foam pad was placed in the pericardium to insulate the heart and protect the left phrenic nerve.  A temperature probe was placed in myocardial septum and a cardioplegic cannula was placed in the ascending aorta.  The aorta was crossclamped.  The left ventricle was emptied via the aortic root vent.  Cardiac arrest then was achieved combination of cold antegrade blood cardioplegia and topical iced saline.  1 L of cardioplegia was administered.  There was a rapid diastolic arrest and myocardial septal cooling to 10 degrees Celsius.  The following distal anastomoses were performed.  First, a reversed saphenous vein graft was placed end-to-side to the posterior descending branch of the right coronary.  This was a 1.5 mm good quality target.  The vein was anastomosed end-to-side with a running 7-0 Prolene suture.  All anastomoses were probed proximally and distally at their completion before tying the sutures.  Cardioplegia was administered at the completion of each vein graft to assess flow and hemostasis.  Next, a reversed saphenous vein graft was placed sequentially to the first diagonal (ramus intermedius) and the second obtuse marginal.  The first obtuse marginal was too small to graft.  Both of these target vessels were 1.5 mm vessels.  The diagonal was a bifurcating vessel, and the smaller  limb after the bifurcation did have 90% stenosis, but this vessel was too small to graft as well.  A side-to-side anastomosis was performed to the first diagonal with a running 7-0 Prolene suture and end-to-side was performed to OM 2 with a running 7-0 Prolene suture.  At the completion of the OM 2 anastomosis, cardioplegia was administered down the graft, and there was good flow and good hemostasis.  Additional cardioplegia was administered.  The left internal mammary artery then was brought through a window in the pericardium.  The distal end was beveled.  It was then anastomosed end-to-side to the distal LAD. The LAD was a large vessel, was diffusely diseased.  There was a relatively minimal disease site of the anastomosis.  There was calcified plaque distal to the anastomosis that the probe did pass to the apex. The mammary was a good quality conduit.  It was anastomosed end-to-side with a running 7-0 Prolene suture.  At the completion the mammary to LAD anastomosis, the bulldog clamp was briefly removed to inspect for hemostasis.  Immediate and rapid septal rewarming was noted.  The bulldog Clamp was replaced, and the mammary pedicle was tacked to the epicardial surface of the heart with 6-0 Prolene sutures.  Additional cardioplegia was administered.  The vein grafts were cut to length.  The cardioplegic cannula was removed from the ascending aorta, and the proximal vein graft anastomoses were performed to 4.5 mm punch aortotomies with running 6-0 Prolene sutures.  At the completion of final proximal anastomosis, the patient was placed in Trendelenburg position.  Lidocaine was administered.  The aortic root was de-aired and aortic crossclamp was removed.  Total crossclamp time was 76 minutes. The patient initially fibrillated, but spontaneously converted.  While rewarming was completed, all proximal and distal anastomoses were inspected for hemostasis.  Epicardial pacing wires were  placed on the right ventricle and right atrium.  A dopamine infusion was initiated at 5 mcg/kg per minute.  The patient then was weaned from cardiopulmonary bypass on the first attempt.  The bypass time was 20 minutes.  The initial cardiac index was approximately 1.5, but improved with volume administration and was greater than 2 thereafter.  A test dose of protamine was administered and was well tolerated.  The atrial and aortic cannulae were removed.  The remainder of the protamine was administered without incident.  Chest was irrigated with warm saline.  Hemostasis was achieved.  The mediastinal left pleural chest tubes were placed in separate subcostal incisions.  The pericardium was reapproximated over the aortic root with interrupted 3-0 silk sutures. The sternum was closed with interrupted heavy gauge double stainless steel wires.  The pectoralis fascia, subcutaneous tissue, and the skin were closed in standard fashion.  All sponge, needle, and instrument counts were correct at end of the procedure.  The patient was taken from the operating room to the Surgical Intensive Care Unit in good condition.     Salvatore Decent Dorris Fetch, M.D.     SCH/MEDQ  D:  07/11/2012  T:  07/12/2012  Job:  [id number]

## 2012-07-13 ENCOUNTER — Inpatient Hospital Stay (HOSPITAL_COMMUNITY): Payer: Medicare Other

## 2012-07-13 LAB — GLUCOSE, CAPILLARY: Glucose-Capillary: 88 mg/dL (ref 70–99)

## 2012-07-13 LAB — TYPE AND SCREEN
Antibody Screen: NEGATIVE
Unit division: 0
Unit division: 0

## 2012-07-13 LAB — CBC
Hemoglobin: 9 g/dL — ABNORMAL LOW (ref 12.0–15.0)
MCH: 26.5 pg (ref 26.0–34.0)
MCHC: 32 g/dL (ref 30.0–36.0)
Platelets: 128 10*3/uL — ABNORMAL LOW (ref 150–400)

## 2012-07-13 LAB — BASIC METABOLIC PANEL
BUN: 13 mg/dL (ref 6–23)
Calcium: 8.7 mg/dL (ref 8.4–10.5)
GFR calc non Af Amer: 68 mL/min — ABNORMAL LOW (ref >90)
Glucose, Bld: 95 mg/dL (ref 70–99)

## 2012-07-13 MED ORDER — EXENATIDE 10 MCG/0.04ML ~~LOC~~ SOPN
10.0000 ug | PEN_INJECTOR | Freq: Two times a day (BID) | SUBCUTANEOUS | Status: DC
  Filled 2012-07-13: qty 2.4

## 2012-07-13 MED ORDER — INSULIN ASPART 100 UNIT/ML ~~LOC~~ SOLN
0.0000 [IU] | Freq: Three times a day (TID) | SUBCUTANEOUS | Status: DC
Start: 2012-07-13 — End: 2012-07-18
  Administered 2012-07-13: 4 [IU] via SUBCUTANEOUS
  Administered 2012-07-13 – 2012-07-14 (×3): 3 [IU] via SUBCUTANEOUS
  Administered 2012-07-15: 4 [IU] via SUBCUTANEOUS
  Administered 2012-07-16: 3 [IU] via SUBCUTANEOUS
  Administered 2012-07-16: 4 [IU] via SUBCUTANEOUS
  Administered 2012-07-17: 3 [IU] via SUBCUTANEOUS
  Administered 2012-07-17: 4 [IU] via SUBCUTANEOUS
  Administered 2012-07-18: 3 [IU] via SUBCUTANEOUS

## 2012-07-13 MED ORDER — ZOLPIDEM TARTRATE 5 MG PO TABS: 5.0000 mg | ORAL_TABLET | Freq: Every evening | ORAL | Status: DC | PRN

## 2012-07-13 MED ORDER — GUAIFENESIN ER 600 MG PO TB12
1200.0000 mg | ORAL_TABLET | Freq: Two times a day (BID) | ORAL | Status: AC
  Administered 2012-07-13: 1200 mg via ORAL
  Filled 2012-07-13: qty 1
  Filled 2012-07-13 (×9): qty 2

## 2012-07-13 MED ORDER — MAGNESIUM HYDROXIDE 400 MG/5ML PO SUSP: 30.0000 mL | Freq: Every day | ORAL | Status: DC | PRN

## 2012-07-13 MED ORDER — EXENATIDE 10 MCG/0.04ML ~~LOC~~ SOPN
10.0000 ug | PEN_INJECTOR | Freq: Two times a day (BID) | SUBCUTANEOUS | Status: DC
  Administered 2012-07-13: 10 ug via SUBCUTANEOUS

## 2012-07-13 MED ORDER — BEPOTASTINE BESILATE 1.5 % OP SOLN
1.0000 [drp] | Freq: Two times a day (BID) | OPHTHALMIC | Status: DC
  Administered 2012-07-13 – 2012-07-18 (×5): 1 [drp] via OPHTHALMIC
  Filled 2012-07-13: qty 0.1

## 2012-07-13 MED ORDER — INSULIN NPH (HUMAN) (ISOPHANE) 100 UNIT/ML ~~LOC~~ SUSP
5.0000 [IU] | Freq: Every day | SUBCUTANEOUS | Status: DC
  Administered 2012-07-13 – 2012-07-17 (×3): 5 [IU] via SUBCUTANEOUS
  Filled 2012-07-13: qty 10

## 2012-07-13 MED ORDER — SODIUM CHLORIDE 0.9 % IJ SOLN
3.0000 mL | Freq: Two times a day (BID) | INTRAMUSCULAR | Status: DC
  Administered 2012-07-13 – 2012-07-17 (×7): 3 mL via INTRAVENOUS

## 2012-07-13 MED ORDER — METFORMIN HCL 500 MG PO TABS: 1000.0000 mg | ORAL_TABLET | Freq: Two times a day (BID) | ORAL | Status: DC

## 2012-07-13 MED ORDER — SODIUM CHLORIDE 0.9 % IV SOLN: 250.0000 mL | INTRAVENOUS | Status: DC | PRN

## 2012-07-13 MED ORDER — POTASSIUM CHLORIDE CRYS ER 20 MEQ PO TBCR
20.0000 meq | EXTENDED_RELEASE_TABLET | Freq: Every day | ORAL | Status: DC
  Administered 2012-07-13 – 2012-07-17 (×5): 20 meq via ORAL
  Filled 2012-07-13 (×6): qty 1

## 2012-07-13 MED ORDER — SODIUM CHLORIDE 0.9 % IJ SOLN: 3.0000 mL | INTRAMUSCULAR | Status: DC | PRN

## 2012-07-13 MED ORDER — ALPRAZOLAM 0.25 MG PO TABS: 0.2500 mg | ORAL_TABLET | Freq: Four times a day (QID) | ORAL | Status: DC | PRN

## 2012-07-13 MED ORDER — METFORMIN HCL 500 MG PO TABS
1000.0000 mg | ORAL_TABLET | Freq: Two times a day (BID) | ORAL | Status: DC
  Administered 2012-07-13: 1000 mg via ORAL
  Filled 2012-07-13 (×12): qty 2

## 2012-07-13 MED ORDER — ALUM & MAG HYDROXIDE-SIMETH 200-200-20 MG/5ML PO SUSP: 15.0000 mL | ORAL | Status: DC | PRN

## 2012-07-13 MED ORDER — MOVING RIGHT ALONG BOOK
Freq: Once | Status: AC
  Administered 2012-07-13: 16:00:00
  Filled 2012-07-13: qty 1

## 2012-07-13 MED ORDER — FUROSEMIDE 40 MG PO TABS
40.0000 mg | ORAL_TABLET | Freq: Every day | ORAL | Status: DC
  Administered 2012-07-13 – 2012-07-16 (×4): 40 mg via ORAL
  Filled 2012-07-13 (×5): qty 1

## 2012-07-13 NOTE — Progress Notes (Signed)
2 Days Post-Op Procedure(s) (LRB): CORONARY ARTERY BYPASS GRAFTING (CABG) (N/A) Subjective: [name]/o cough, congestion  Objective:  Vital signs in last 24 hours: Temp:  [97.5 F (36.4 [name])-99.7 F (37.6 [name])] 97.6 F (36.4 [name]) (05/21 0727) Pulse Rate:  [76-91] 89 (05/21 0700) Cardiac Rhythm:  [-] Normal sinus rhythm (05/21 0500) Resp:  [13-35] 16 (05/21 0700) BP: (100-135)/(44-79) 105/48 mmHg (05/21 0700) SpO2:  [96 %-100 %] 96 % (05/21 0700) Arterial Line BP: (155-164)/(59-62) 164/61 mmHg (05/20 0900) Weight:  [232 lb 12.9 oz (105.6 kg)] 232 lb 12.9 oz (105.6 kg) (05/21 0500)  Hemodynamic parameters for last 24 hours: PAP: (55-58)/(29-30) 58/30 mmHg  Intake/Output from previous day: 05/20 0701 - 05/21 0700 In: 1305.9 [P.O.:510; I.V.:495.9; IV Piggyback:300] Out: 1300 [Urine:1300] Intake/Output this shift:    General appearance: alert and no distress Neurologic: intact Heart: regular rate and rhythm Lungs: diminished breath sounds left base Abdomen: normal findings: soft, non-tender  Lab Results:  Recent Labs  07/12/12 1630 07/12/12 1634 07/13/12 0430  WBC 14.5*  --  13.5*  HGB 9.9* 10.5* 9.0*  HCT 30.1* 31.0* 28.1*  PLT 126*  --  128*   BMET:  Recent Labs  07/12/12 0357  07/12/12 1634 07/13/12 0430  NA 139  --  140 139  K 3.9  --  4.6 4.5  CL 109  --  108 107  CO2 18*  --   --  23  GLUCOSE 109*  --  156* 95  BUN 10  --  11 13  CREATININE 0.71  < > 0.90 0.83  CALCIUM 8.5  --   --  8.7  < > = values in this interval not displayed.  PT/INR:  Recent Labs  07/11/12 1500  LABPROT 15.7*  INR 1.28   ABG    Component Value Date/Time   PHART 7.339* 07/12/2012 0204   HCO3 20.0 07/12/2012 0204   TCO2 23 07/12/2012 1634   ACIDBASEDEF 5.0* 07/12/2012 0204   O2SAT 98.0 07/12/2012 0204   CBG (last 3)   Recent Labs  07/12/12 1530 07/12/12 1959 07/13/12 0017  GLUCAP 130* 124* 95    Assessment/Plan: S/P Procedure(s) (LRB): CORONARY ARTERY BYPASS GRAFTING  (CABG) (N/A) Plan for transfer to step-down: see transfer orders POD # 2 CABG CV- stable  RESP- has bibasilar atelectasis L > R poor cough- add flutter valve and mucinexc to help clear secretions  RENAL- lytes, creatinine OK- continue diuresis, dc foley  ENDO- CBG well controlled  Anemia secondary to ABL- mild, follow  Ambulate, cardiac rehab     LOS: 7 days    Ashlee [name] 07/13/2012

## 2012-07-13 NOTE — Progress Notes (Signed)
CARDIAC REHAB PHASE I   PRE:  Rate/Rhythm: 88 SR  BP:  Supine: 124/54  Sitting:   Standing:    SaO2: 100 2L  MODE:  Ambulation: 150 ft   POST:  Rate/Rhythm: 104 ST  BP:  Supine:   Sitting: 102/79  Standing:    SaO2: 97 2L 1500-1530 On arrival pt in bed wheezing. Assisted X 2 used walker and O2 2L  to ambulate. Pt groggy and c/o of SOB with walking, wheezing. Pt able to walk 150 feet, exhausted by end of walk. Pt to recliner after walk with call light in reach. O2 sat after walk 97% on 2L.   Melina Copa RN 07/13/2012 3:25 PM

## 2012-07-13 NOTE — Progress Notes (Signed)
Pt ambulated 150 ft with rolling walker. Tolerated ambulation well. Encouraged to ambulate further on next walk. Back to chair with call bell in reach. Dion Saucier

## 2012-07-14 ENCOUNTER — Inpatient Hospital Stay (HOSPITAL_COMMUNITY): Payer: Medicare Other

## 2012-07-14 LAB — BASIC METABOLIC PANEL
BUN: 17 mg/dL (ref 6–23)
CO2: 24 mEq/L (ref 19–32)
Chloride: 102 mEq/L (ref 96–112)
GFR calc non Af Amer: 66 mL/min — ABNORMAL LOW (ref >90)
Glucose, Bld: 118 mg/dL — ABNORMAL HIGH (ref 70–99)
Potassium: 4.4 mEq/L (ref 3.5–5.1)

## 2012-07-14 LAB — GLUCOSE, CAPILLARY
Glucose-Capillary: 111 mg/dL — ABNORMAL HIGH (ref 70–99)
Glucose-Capillary: 148 mg/dL — ABNORMAL HIGH (ref 70–99)

## 2012-07-14 LAB — CBC
HCT: 27 % — ABNORMAL LOW (ref 36.0–46.0)
Hemoglobin: 8.7 g/dL — ABNORMAL LOW (ref 12.0–15.0)
MCHC: 32.2 g/dL (ref 30.0–36.0)
RBC: 3.25 MIL/uL — ABNORMAL LOW (ref 3.87–5.11)

## 2012-07-14 NOTE — Progress Notes (Addendum)
CARDIAC REHAB PHASE I   PRE:  Rate/Rhythm: 81 SR    BP: sitting 99/66    SaO2: wouldn't register  MODE:  Ambulation: 150 ft   POST:  Rate/Rhythm: 93 SR    BP: sitting 110/48     SaO2: 98 RA after several minutes  Pt groggy in chair. Very difficult to stand due to knees, weakness and pt struggling to understand directions (HOH and grogginess). Used gait belt and mod/max assist x2 to pull up. Slow pace walking with RW and 2L, many rest stops. Some wheezing noted. Could not get SaO2 to register accurately. Pt "plopped" into chair after walk, exhausted. Continues to be motivated. Pt is slow to progress and mobilize. Would benefit from PT. [phone]  Elissa Lovett [place] CES, ACSM 07/14/2012 10:46 AM

## 2012-07-14 NOTE — Progress Notes (Addendum)
                    [address]            [name] [address]          [phone]     3 Days Post-Op Procedure(s) (LRB): CORONARY ARTERY BYPASS GRAFTING (CABG) (N/A)  Subjective: Feels okay this am, no specific complaints.  Appetite so-so, breathing stable.    Objective: Vital signs in last 24 hours: Patient Vitals for the past 24 hrs:  BP Temp Temp src Pulse Resp SpO2 Weight  07/14/12 0442 109/54 mmHg 97.9 F (36.6 C) Oral 90 20 100 % 234 lb 5.6 oz (106.3 kg)  07/13/12 2019 108/52 mmHg 98.2 F (36.8 C) Oral 86 22 100 % -  07/13/12 1731 116/39 mmHg - - 86 - - -  07/13/12 1514 124/54 mmHg - - - - - -  07/13/12 1436 122/99 mmHg 97.5 F (36.4 C) Oral 50 21 100 % -  07/13/12 1300 103/45 mmHg - - 75 19 100 % -  07/13/12 1200 106/60 mmHg - - 79 22 99 % -  07/13/12 1153 - 97.6 F (36.4 C) Oral - - - -  07/13/12 1100 116/68 mmHg - - 85 15 100 % -  07/13/12 1000 110/78 mmHg - - 87 18 100 % -  07/13/12 0900 108/34 mmHg - - 88 21 100 % -  07/13/12 0800 111/51 mmHg - - 73 19 100 % -   Current Weight  07/14/12 234 lb 5.6 oz (106.3 kg)  PRE-OPERATIVE WEIGHT: 101 kg   Intake/Output from previous day: 05/21 0701 - 05/22 0700 In: 520 [P.O.:480; I.V.:40] Out: 267 [Urine:266; Stool:1]  CBGs 156-142-118-111   PHYSICAL EXAM:  Heart: RRR Lungs:Decreased BS in bases bilaterally Wound: Clean and dry Extremities: +bilateral LE edema    Lab Results: CBC: Recent Labs  07/13/12 0430 07/14/12 0450  WBC 13.5* 11.9*  HGB 9.0* 8.7*  HCT 28.1* 27.0*  PLT 128* 146*   BMET:  Recent Labs  07/13/12 0430 07/14/12 0450  NA 139 138  K 4.5 4.4  CL 107 102  CO2 23 24  GLUCOSE 95 118*  BUN 13 17  CREATININE 0.83 0.85  CALCIUM 8.7 9.0    PT/INR:  Recent Labs  07/11/12 1500  LABPROT 15.7*  INR 1.28   CXR: small effusions, patchy atelectasis on L, generally stable   Assessment/Plan: S/P Procedure(s) (LRB): CORONARY ARTERY BYPASS GRAFTING (CABG)  (N/A)  CV- SR, BPs stable. Continue Benicar, Coreg.  Vol overload- diurese.  Expected postop blood loss anemia- watch.  DM- Back on home meds.  CBGs generally stable.  CRPI, pulm toilet.  Possibly home over the weekend if she continues to progress.   LOS: 8 days    COLLINS,GINA H 07/14/2012  Patient seen and examined. Agree with above.  Pain well controlled Appetite poor, + BM this AM Hopefully home Saturday

## 2012-07-15 LAB — GLUCOSE, CAPILLARY

## 2012-07-15 NOTE — Evaluation (Signed)
Physical Therapy Evaluation Patient Details Name: Ashlee [name] MRN: [id number] DOB: 02-Jul-1940 Today's Date: 07/15/2012 Time: 4098-1191 PT Time Calculation (min): 35 min  PT Assessment / Plan / Recommendation Clinical Impression  Mrs. Mossbarger is a 72 y/o female s/p CAB.  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  Acute PT to follow pt to progress mobility and knowledge of precautions for safe d/c to home.       PT Assessment  Patient needs continued PT services    Follow Up Recommendations  Home health PT;Supervision/Assistance - 24 hour    Does the patient have the potential to tolerate intense rehabilitation      Barriers to Discharge None      Equipment Recommendations  None recommended by PT    Recommendations for Other Services     Frequency Min 5X/week    Precautions / Restrictions Precautions Precautions: Fall;Sternal Restrictions Weight Bearing Restrictions: Yes (sternal precautions. )   Pertinent Vitals/Pain Pt c/o pain in R knee 6/10.  Pt medicated prior to session.  SpO2 dropped to 84 on room air at rest.  SpO2 on 2L O2 via [place] >95 at rest and with activity.        Mobility  Bed Mobility Bed Mobility: Not assessed (Pt OOB with nsg. ) Transfers Transfers: Sit to Stand;Stand to Sit Sit to Stand: 3: Mod assist;From chair/3-in-1;Without upper extremity assist Stand to Sit: 4: Min guard;To chair/3-in-1;With upper extremity assist Details for Transfer Assistance: Manual facilitation to initiate sit to stand transfer without use of UEs. Pt requires UE support secondary to weakness  and pain in Right knee.   Ambulation/Gait Ambulation/Gait Assistance: 4: Min guard Ambulation Distance (Feet): 35 Feet Assistive device: Rolling walker Ambulation/Gait Assistance Details: VCs to increase gait speed and increase bilateral step length.  Gait Pattern: Step-to pattern;Decreased stride length;Antalgic Gait velocity:  slow  General Gait Details: 2 standing rest breaks SpO2 >94 on 2L O2 Stairs: No Wheelchair Mobility Wheelchair Mobility: No    Exercises Total Joint Exercises Ankle Circles/Pumps: 10 reps;Seated   PT Diagnosis: Abnormality of gait;Acute pain;Generalized weakness  PT Problem List: Decreased strength;Decreased activity tolerance;Decreased mobility;Decreased knowledge of precautions;Obesity;Pain PT Treatment Interventions: Gait training;Stair training;DME instruction;Functional mobility training;Therapeutic activities;Therapeutic exercise;Patient/family education   PT Goals Acute Rehab PT Goals PT Goal Formulation: With patient Time For Goal Achievement: 07/29/12 Potential to Achieve Goals: Good Pt will go Supine/Side to Sit: with modified independence PT Goal: Supine/Side to Sit - Progress: Goal set today Pt will go Sit to Supine/Side: with modified independence PT Goal: Sit to Supine/Side - Progress: Goal set today Pt will go Sit to Stand: with modified independence;without upper extremity assist;from elevated surface PT Goal: Sit to Stand - Progress: Goal set today Pt will Ambulate: 51 - 150 feet;with modified independence;with least restrictive assistive device PT Goal: Ambulate - Progress: Goal set today Pt will Go Up / Down Stairs: 3-5 stairs;with supervision;with least restrictive assistive device PT Goal: Up/Down Stairs - Progress: Goal set today  Visit Information  Last PT Received On: 07/15/12 Assistance Needed: +1    Subjective Data  Subjective: I was supposed to have my right knee replaced.   Patient Stated Goal: Return to home.    Prior Functioning  Home Living Lives With: Spouse Available Help at Discharge: Family;Available 24 hours/day Type of Home: House Home Access: Stairs to enter [employer] of Steps: 3 Entrance Stairs-Rails: None Home Layout: One level Bathroom Shower/Tub: Walk-in shower;Door [employer]  Toilet: Standard Bathroom Accessibility:  Yes How Accessible: Accessible via walker Home Adaptive Equipment: Bedside commode/3-in-1;Walker - rolling;Straight cane;Built-in shower seat Prior Function Level of Independence: Independent with assistive device(s) (Cane to ambulate secondary to OA in R knee. ) Able to Take Stairs?: Yes Driving: No Vocation: Retired Musician: Surveyor, mining Arousal/Alertness: Awake/alert Behavior During Therapy: WFL for tasks assessed/performed Overall Cognitive Status: Within Functional Limits for tasks assessed    Extremity/Trunk Assessment Right Upper Extremity Assessment RUE ROM/Strength/Tone: [facility] for tasks assessed Left Upper Extremity Assessment LUE ROM/Strength/Tone: WFL for tasks assessed Right Lower Extremity Assessment RLE ROM/Strength/Tone: Deficits;Due to pain RLE ROM/Strength/Tone Deficits: Generalized weakness with sit to stand transfer secondary to pain in knee.  Left Lower Extremity Assessment LLE ROM/Strength/Tone: WFL for tasks assessed   Balance    End of Session PT - End of Session Equipment Utilized During Treatment: Gait belt Activity Tolerance: Patient tolerated treatment well Patient left: in chair;with call bell/phone within reach Nurse Communication: Mobility status  GP     Tashari Schoenfelder 07/15/2012, 10:52 AM Theron Arista L. Chilton Si, DPT   Pager [phone]     Cell [phone]

## 2012-07-15 NOTE — Progress Notes (Signed)
CARDIAC REHAB PHASE I   PRE:  Rate/Rhythm: 66 SR    BP: sitting 101/42    SaO2: 100 1 1/2L  MODE:  Ambulation: 214 ft   POST:  Rate/Rhythm: 80 SR    BP: sitting 112/50     SaO2: 100 2L  Pt stronger today. Better stride length, more alert. Tired after walk. Oxygen good on 2L, will try without O2 tomorrow (noted desat with PT today). To recliner.  [phone]  Ashlee [name] CES, ACSM 07/15/2012 2:18 PM

## 2012-07-15 NOTE — Care Management Note (Unsigned)
    Page 1 of 2   07/15/2012     3:57:52 PM   CARE MANAGEMENT NOTE 07/15/2012  Patient:  Ashlee [name], Ashlee [name]   Account Number:  [id number]  Date Initiated:  07/13/2012  Documentation initiated by:  Alvira Philips Assessment:   72 yr-old female adm with dx of CAD; lives with spouse     Action/Plan:   WILL FOLLOW FOR HOME NEEDS AS PT PROGRESSES.   Anticipated DC Date:  07/15/2012   Anticipated DC Plan:  HOME W HOME HEALTH SERVICES      DC Planning Services  CM consult      [employer] Choice  HOME HEALTH   Choice offered to / List presented to:  C-1 Patient        HH arranged  HH-1 RN  HH-2 PT      [facility] agency  [facility]   Status of service:  In process, will continue to follow Medicare Important Message given?   (If response is "NO", the following Medicare IM given date fields will be blank) Date Medicare IM given:   Date Additional Medicare IM given:    Discharge Disposition:  HOME W HOME HEALTH SERVICES  Per UR Regulation:  Reviewed for med. necessity/level of care/duration of stay  If discussed at Long Length of Stay Meetings, dates discussed:   07/14/2012    Comments:  PCP: Dr Casimiro Needle [place]  07/15/12 Kirandeep Fariss,RN,BSN [phone] PT WILL NEED HH FOLLOW UP AT DC.  REFERRAL TO [facility], PER PT CHOICE.  START OF CARE 24-48H POST DC DATE.  WILL LIKELY NEED HOME OXYGEN SET UP, AS PT CONT TO DESATURATE.  PT HAS RW, BSC, WC, TUB SEAT AT HOME.  NO DME NEEDED.  WILL FOLLOW FOR LIKELY OXYGEN SET UP.  07/13/12 1140 Henrietta Mayo RN MSN BSN CCM Pt states spouse and/or family will be available to assist 24/7 when she is discharged.  Cardiac rehab to ambulate.

## 2012-07-15 NOTE — Progress Notes (Signed)
Pt has been refusing metformin. She says she was told by MD to stop taking.  She has been refusing byetta due to poor intake.  Pt had been refusing tylenol.  Pt given tylenol and oxycodone due severe pain in right knee Thursday at 17:43.  Pt lethargic and sleeping. Family at bedside.  Will continue to monitor. Thomas Hoff

## 2012-07-15 NOTE — Discharge Summary (Signed)
[address]            Ashlee [name] [address]          [phone]         Discharge Summary  Name: Ashlee [name] DOB: [birth year] 72 y.o. MRN: [id number]   Admission Date: 07/06/2012 Discharge Date: 07/18/2012    Admitting Diagnosis: Chest pain   Discharge Diagnosis:  Severe 3 vessel coronary artery disease Expected postoperative blood loss anemia  Past Medical History  Diagnosis Date  . CAD (coronary artery disease) 1998    stent post heart attack  . Hypertension   . Hyperlipidemia   . Myocardial infarction 1998  . PONV (postoperative nausea and vomiting)   . Shortness of breath     "occasionally; could happen at any time" (07/06/2012)  . DM type 2 (diabetes mellitus, type 2)   . Iron deficiency anemia   . Migraines     "ages 79 thru 57; associated w/menstral cycle" (07/06/2012)  . Arthritis     "in my knees" (07/06/2012)     Procedures: CORONARY ARTERY BYPASS GRAFTING 4  (Left internal mammary artery to left anterior descending, saphenous vein graft to posterior descending, sequential saphenous vein graft to first diagonal and obtuse marginal 2)  ENDOSCOPIC VEIN HARVEST RIGHT LEG- 07/11/2012 crenshw  HPI:  The patient is a 72 y.o. female with a known history of CAD, status post PCI of the LAD in 1998 following an MI.  She has been regularly followed by Dr. Jens Som, and echo in 2012 showed EF 45-50%.  She recently returned for followup in March 2014 and related symptoms of increasing dyspnea with exertion.  She denied chest pain, orthopnea, or PND.  She underwent a nuclear study which showed anteroseptal and apical infarct, with EF decreased to 23%.  Cardiac catheterization was recommended, and this was performed on 07/06/2012, revealing severe 3 vessel CAD, not felt to be amenable to further percutaneous intervention.  She was subsequently admitted for further evaluation.   Hospital Course:  The patient was admitted to [facility] on  07/06/2012.  A cardiac surgery consult was requested and Dr. Dorris Fetch saw the patient.  He recommended proceeding with surgical revascularization.  All risks, benefits and alternatives of surgery were explained in detail, and the patient agreed to proceed. The patient was taken to the operating room and underwent the above procedure.    The postoperative course has generally been uneventful.  She has had some mobility issues due to arthritis in her knee, for which she needs knee replacement.  PT and cardiac rehab have been working with her on ambulation and she is progressing well.  Home health PT has been ordered to assist post-discharge. Her cardiac status has remained stable.  She is tolerating a diet and has been restarted on her home diabetes meds.  She was started on Lasix for volume overload and is diuresing well.  We anticipate discharge in the next 24-48 hours if she continues to progress as expected.     Recent vital signs:  Filed Vitals:   07/18/12 0412  BP: 104/42  Pulse: 78  Temp: 98.6 F (37 C)  Resp: 20    Recent laboratory studies:  CBC:  Recent Labs  07/16/12 0610  WBC 8.4  HGB 8.3*  HCT 25.6*  PLT 203   BMET:   Recent Labs  07/16/12 0610  NA 136  K 4.4  CL 102  CO2 22  GLUCOSE 162*  BUN 23  CREATININE 0.97  CALCIUM 8.8    PT/INR: No results found for this basename: LABPROT, INR,  in the last 72 hours   Discharge Medications:     Medication List    STOP taking these medications       aspirin 81 MG tablet     BENICAR PO     hydrochlorothiazide 25 MG tablet  Commonly known as:  HYDRODIURIL     multivitamin capsule      TAKE these medications       acetaminophen 325 MG tablet  Commonly known as:  TYLENOL  Take 2 tablets (650 mg total) by mouth every 6 (six) hours as needed for pain.     aspirin 325 MG EC tablet  Take 1 tablet (325 mg total) by mouth daily.     atorvastatin 80 MG tablet  Commonly known as:  LIPITOR  Take 40 mg by  mouth daily.     BEPREVE 1.5 % Soln  Generic drug:  Bepotastine Besilate  Place 1 drop into both eyes 2 (two) times daily.     BYETTA 10 MCG PEN 10 MCG/0.04ML Sopn  Generic drug:  exenatide  bid     carvedilol 3.125 MG tablet  Commonly known as:  COREG  Take 1 tablet (3.125 mg total) by mouth 2 (two) times daily.     CENTRUM SILVER ADULT 50+ PO  Take 1 tablet by mouth daily.     ergocalciferol 50000 UNITS capsule  Commonly known as:  VITAMIN D2  Take 50,000 Units by mouth once a week.     furosemide 40 MG tablet  Commonly known as:  LASIX  Take 1 tablet (40 mg total) by mouth daily. For one week then stop.     glimepiride 4 MG tablet  Commonly known as:  AMARYL  Take 2 mg by mouth daily before breakfast.     insulin NPH 100 UNIT/ML injection  Commonly known as:  HUMULIN N,NOVOLIN N  Inject 5 Units into the skin at bedtime.     Iron 325 (65 FE) MG Tabs  Take 1 tablet by mouth daily.     metFORMIN 1000 MG tablet  Commonly known as:  GLUCOPHAGE  Take 1,000 mg by mouth 2 (two) times daily with a meal.     pioglitazone 30 MG tablet  Commonly known as:  ACTOS  Take 30 mg by mouth daily.     potassium chloride SA 20 MEQ tablet  Commonly known as:  K-DUR,KLOR-CON  Take 1 tablet (20 mEq total) by mouth daily. For one week then stop.     SMARTEST TEST test strip  Generic drug:  glucose blood  1 each by Other route 2 (two) times daily. Use as instructed     traMADol 50 MG tablet  Commonly known as:  ULTRAM  Take 1 tablet (50 mg total) by mouth every 6 (six) hours as needed for pain.     vitamin B-12 1000 MCG tablet  Commonly known as:  CYANOCOBALAMIN  Take 1,000 mcg by mouth daily.       The patient has been discharged on:   1.Beta Blocker:  Yes [  x ]                              No   [   ]  If No, reason:  2.Ace Inhibitor/ARB: Yes [   ]                                     No  [  x  ]                                     If No,  reason:Labile blood pressure  3.Statin:   Yes [ x  ]                  No  [   ]                  If No, reason:  4.Ecasa:  Yes  [ x  ]                  No   [   ]                  If No, reason:  Discharge Instructions:  The patient is to refrain from driving, heavy lifting or strenuous activity.  May shower daily and clean incisions with soap and water.  May resume regular diet.   Follow Up:  Follow-up Information   Follow up with Olga Millers, MD. Schedule an appointment as soon as possible for a visit in 2 weeks.   Contact information:   [address] [place], [address]                         [address] [place] [address] [phone]       Follow up with Loreli Slot, MD In 3 weeks. (Office will contact you with an appointment)    Contact information:   [address] [place] [address] [phone]       Follow-up Information   Follow up with Olga Millers, MD. Schedule an appointment as soon as possible for a visit in 2 weeks.   Contact information:   [address] [place], [address]                         [address] [place] [address] [phone]       Follow up with Loreli Slot, MD In 3 weeks. (Office will contact you with an appointment)    Contact information:   [address] [place] [address] [phone]        Ardelle Balls PA-C 07/18/2012, 8:02 AM

## 2012-07-15 NOTE — Progress Notes (Addendum)
                    [address]            [place] [address]          [phone]     4 Days Post-Op Procedure(s) (LRB): CORONARY ARTERY BYPASS GRAFTING (CABG) (N/A)  Subjective: Feels a little better today.  Main complaint is knee trouble.   Objective: Vital signs in last 24 hours: Patient Vitals for the past 24 hrs:  BP Temp Temp src Pulse Resp SpO2 Weight  07/15/12 0401 101/41 mmHg 97.3 F (36.3 C) Oral 72 19 100 % 234 lb 5.6 oz (106.3 kg)  07/14/12 2236 84/57 mmHg - - - - - -  07/14/12 2021 72/45 mmHg - - - - - -  07/14/12 2016 85/47 mmHg 98.2 F (36.8 C) Oral 68 18 100 % -  07/14/12 1404 118/48 mmHg 98.6 F (37 C) Oral 74 18 100 % -   Current Weight  07/15/12 234 lb 5.6 oz (106.3 kg)  PRE-OPERATIVE WEIGHT: 101 kg    Intake/Output from previous day: 05/22 0701 - 05/23 0700 In: 360 [P.O.:360] Out: -   CBGs [phone]    PHYSICAL EXAM:  Heart: RRR Lungs: Slightly decreased BS in bases Wound: Clean and dry Extremities: +LE edema    Lab Results: CBC: Recent Labs  07/13/12 0430 07/14/12 0450  WBC 13.5* 11.9*  HGB 9.0* 8.7*  HCT 28.1* 27.0*  PLT 128* 146*   BMET:  Recent Labs  07/13/12 0430 07/14/12 0450  NA 139 138  K 4.5 4.4  CL 107 102  CO2 23 24  GLUCOSE 95 118*  BUN 13 17  CREATININE 0.83 0.85  CALCIUM 8.7 9.0    PT/INR: No results found for this basename: LABPROT, INR,  in the last 72 hours    Assessment/Plan: S/P Procedure(s) (LRB): CORONARY ARTERY BYPASS GRAFTING (CABG) (N/A) CV- SBPs got a little low overnight, pt asymptomatic. Will watch. Continue Benicar, Coreg. May need to adjust doses if BPs remain low. Vol overload- diurese.  Expected postop blood loss anemia- recheck labs in am.  DM- Back on home meds. CBGs generally stable.  CRPI, pulm toilet.  Deconditioning- will order PT consult as she is awaiting knee replacement and has mobility issues. Will likely benefit from HHPT. Home hopefully 1-2  days.    LOS: 9 days    COLLINS,GINA H 07/15/2012  Agree with above.

## 2012-07-15 NOTE — Progress Notes (Signed)
Pt has been somewhat sedated since receiving Oxycodone for pain around 1800, would not feel comfortable attempting to walk patient this evening.   BPs also running low in systolic 80s..  Will continue to monitor.  Arva Chafe

## 2012-07-15 NOTE — Progress Notes (Signed)
Removed epicardial wires (4 intact) without any ectopic beats.  Pt instructed to remain on bedrest for one hour.  Frequent vitals taken and documented.  Pt resting with husband at bedside.  Call bell within reach.  Will continue to monitor. Thomas Hoff

## 2012-07-16 LAB — BASIC METABOLIC PANEL
CO2: 22 mEq/L (ref 19–32)
Calcium: 8.8 mg/dL (ref 8.4–10.5)
Chloride: 102 mEq/L (ref 96–112)
Potassium: 4.4 mEq/L (ref 3.5–5.1)
Sodium: 136 mEq/L (ref 135–145)

## 2012-07-16 LAB — CBC
Hemoglobin: 8.3 g/dL — ABNORMAL LOW (ref 12.0–15.0)
MCV: 82.1 fL (ref 78.0–100.0)
Platelets: 203 10*3/uL (ref 150–400)
RBC: 3.12 MIL/uL — ABNORMAL LOW (ref 3.87–5.11)
WBC: 8.4 10*3/uL (ref 4.0–10.5)

## 2012-07-16 LAB — GLUCOSE, CAPILLARY
Glucose-Capillary: 141 mg/dL — ABNORMAL HIGH (ref 70–99)
Glucose-Capillary: 187 mg/dL — ABNORMAL HIGH (ref 70–99)

## 2012-07-16 MED ORDER — OLMESARTAN 10 MG HALF TABLET
10.0000 mg | ORAL_TABLET | Freq: Every day | ORAL | Status: DC
  Filled 2012-07-16: qty 1

## 2012-07-16 NOTE — Progress Notes (Signed)
Patient had two short runs of VTach at [phone], first one was 4 beats and second was 6 beats.  Asymptomatic upon assessment.  Strip flagged up in chart, will continue to monitor.  Arva Chafe

## 2012-07-16 NOTE — Progress Notes (Signed)
Pt refusing 8 am meds, states "I don't want to throw them up". Pt requests meds be given at 10 am "if stomach feels better". Will continue to monitor.

## 2012-07-16 NOTE — Progress Notes (Addendum)
                   [address]            [place] [address]          [phone]      5 Days Post-Op Procedure(s) (LRB): CORONARY ARTERY BYPASS GRAFTING (CABG) (N/A)  Subjective: Patient with nausea and feeling "rundown" this morning.  Objective: Vital signs in last 24 hours: Temp:  [97.8 F (36.6 C)-98.4 F (36.9 C)] 98.2 F (36.8 C) (05/24 0430) Pulse Rate:  [71-83] 75 (05/24 0430) Cardiac Rhythm:  [-] Normal sinus rhythm (05/23 2015) Resp:  [18-20] 20 (05/24 0430) BP: (91-137)/(54-77) 108/74 mmHg (05/24 0430) SpO2:  [96 %-100 %] 96 % (05/24 0430) Weight:  [105 kg (231 lb 7.7 oz)] 105 kg (231 lb 7.7 oz) (05/24 0422)  Pre op weight 101 kg Current Weight  07/16/12 105 kg (231 lb 7.7 oz)       Intake/Output from previous day: 05/23 0701 - 05/24 0700 In: 480 [P.O.:480] Out: 800 [Urine:800]   Physical Exam:  Cardiovascular: RRR, no murmurs, gallops, or rubs. Pulmonary: Slightly diminished at bases; no rales, wheezes, or rhonchi. Abdomen: Soft, non tender, bowel sounds present. Extremities: Mild bilateral lower extremity edema. Wounds: Clean and dry.  No erythema or signs of infection.  Lab Results: CBC: Recent Labs  07/14/12 0450 07/16/12 0610  WBC 11.9* 8.4  HGB 8.7* 8.3*  HCT 27.0* 25.6*  PLT 146* 203   BMET:  Recent Labs  07/14/12 0450 07/16/12 0610  NA 138 136  K 4.4 4.4  CL 102 102  CO2 24 22  GLUCOSE 118* 162*  BUN 17 23  CREATININE 0.85 0.97  CALCIUM 9.0 8.8    PT/INR:  Lab Results  Component Value Date   INR 1.28 07/11/2012   INR 1.00 07/10/2012   INR 1.1* 06/30/2012   ABG:  INR: Will add last result for INR, ABG once components are confirmed Will add last 4 CBG results once components are confirmed  Assessment/Plan:  1. CV - Had 2 brief runs of NSVT earlier this am. Maintaining SR. On Coreg 6.25 bid and Benicar 10 daily. Will hold Benicar today. May need to decrease to 5 daily as SBP somewhat labile 2.   Pulmonary - Encourage incentive spirometer 3. Volume Overload - Continue with diuresis 4.  Acute blood loss anemia - H and H 8.3 and 25.6. Will restart Iron upon discharge as has nausea this am. 5.DM-CBGs 166/141/141. Continue Byetta 10 bid, Amaryl 2 daily, Actos 30 daily,Metformin 1000 bid, and Insulin with meals and at bedtime. Pre op HGA1C 6.3 6.Zofran PRN nausea 7.Possible discharge 1-2 days if feeling better  ZIMMERMAN,DONIELLE MPA-C 07/16/2012,7:53 AM   Chart reviewed, patient examined, agree with above.

## 2012-07-16 NOTE — Progress Notes (Signed)
Pt declined walk due to not feeling well.  Completed pt d/c eduction discussed: ISP use, diet , exercise, restrictions, sternal precautions, and when to call 911/MD.  Pt did voice understanding, but did repeat herself a few times.  Discussed Cardiac Rehab Phase II with pt request to have info sent to [facility].  Pt also requested Home Physical Therapy to help regain strength.  Pt encouraged to continue to walk with staff and ask questions if any arise.  We will f/u on Tues if still here. Fabio Pierce, MA, ACSM RCEP [phone]

## 2012-07-16 NOTE — Progress Notes (Signed)
Pt ambulated 100 ft in hallway with RN using front wheel walker on RA, slow, steady gait. Pt denies SOB or dizziness. Encouraged pt to walk a littler further and pt stated "it's just too painful to walk". Pt assisted back in chair, BP 98/42 (manual) and 02 95% on RA.

## 2012-07-16 NOTE — Progress Notes (Signed)
Patient refused to walk this PM stating it hurts her legs to bad. Explained to her why she needs to try and walk at least 3 times a day. Patient said with her knee problems and how bad it hurts when her legs rub she did not want to walk again tonight.

## 2012-07-16 NOTE — Progress Notes (Signed)
Physical Therapy Treatment Patient Details Name: Ashlee [name] MRN: [id number] DOB: 04-11-1940 Today's Date: 07/16/2012 Time: 4098-1191 PT Time Calculation (min): 17 min  PT Assessment / Plan / Recommendation Comments on Treatment Session  Making progress with mobility.  Continues to have difficulty rising from low surface.    Follow Up Recommendations  Home health PT;Supervision/Assistance - 24 hour     Does the patient have the potential to tolerate intense rehabilitation     Barriers to Discharge        Equipment Recommendations  None recommended by PT    Recommendations for Other Services    Frequency Min 5X/week   Plan Discharge plan remains appropriate;Frequency remains appropriate    Precautions / Restrictions Precautions Precautions: Sternal;Fall Restrictions Weight Bearing Restrictions: No Other Position/Activity Restrictions: Sternal precautions   Pertinent Vitals/Pain O2 sats at 97% with ambulation on room air.    Mobility  Bed Mobility Bed Mobility: Not assessed Transfers Transfers: Sit to Stand;Stand to Sit Sit to Stand: 3: Mod assist;Without upper extremity assist;From chair/3-in-1 Stand to Sit: 4: Min guard;Without upper extremity assist;To chair/3-in-1 Details for Transfer Assistance: Verbal cues for technique.  Assist to rise to standing. Ambulation/Gait Ambulation/Gait Assistance: 4: Min guard Ambulation Distance (Feet): 76 Feet Assistive device: Rolling walker Ambulation/Gait Assistance Details: Verbal cues to remain upright and look forward during gait. Gait Pattern: Step-through pattern;Decreased step length - right;Decreased step length - left;Decreased stride length Gait velocity: Slow General Gait Details: Patient ambulated on room air.  O2 sat remained at 97%.      PT Goals Acute Rehab PT Goals Pt will go Sit to Stand: with modified independence;without upper extremity assist;from elevated surface PT Goal: Sit to Stand - Progress:  Progressing toward goal Pt will Ambulate: 51 - 150 feet;with modified independence;with least restrictive assistive device PT Goal: Ambulate - Progress: Progressing toward goal  Visit Information  Last PT Received On: 07/16/12 Assistance Needed: +1    Subjective Data  Subjective: "I just get so weak"   Cognition  Cognition Arousal/Alertness: Awake/alert Behavior During Therapy: WFL for tasks assessed/performed Overall Cognitive Status: Within Functional Limits for tasks assessed    Balance     End of Session PT - End of Session Equipment Utilized During Treatment: Gait belt Activity Tolerance: Patient limited by fatigue Patient left: in chair;with call bell/phone within reach;with family/visitor present Nurse Communication: Mobility status   GP     Vena Austria 07/16/2012, 3:32 PM Durenda Hurt. Renaldo Fiddler, [facility] Acute Rehab Services Pager [phone]

## 2012-07-17 LAB — GLUCOSE, CAPILLARY
Glucose-Capillary: 132 mg/dL — ABNORMAL HIGH (ref 70–99)
Glucose-Capillary: 151 mg/dL — ABNORMAL HIGH (ref 70–99)
Glucose-Capillary: 130 mg/dL — ABNORMAL HIGH (ref 70–99)

## 2012-07-17 MED ORDER — OLMESARTAN MEDOXOMIL 5 MG PO TABS
5.0000 mg | ORAL_TABLET | Freq: Every day | ORAL | Status: DC
  Administered 2012-07-17: 5 mg via ORAL
  Filled 2012-07-17 (×2): qty 1

## 2012-07-17 MED ORDER — CARVEDILOL 3.125 MG PO TABS
3.1250 mg | ORAL_TABLET | Freq: Two times a day (BID) | ORAL | Status: DC
  Administered 2012-07-17 – 2012-07-18 (×2): 3.125 mg via ORAL
  Filled 2012-07-17 (×4): qty 1

## 2012-07-17 MED ORDER — FUROSEMIDE 40 MG PO TABS
40.0000 mg | ORAL_TABLET | Freq: Every day | ORAL | Status: DC
  Administered 2012-07-18: 40 mg via ORAL
  Filled 2012-07-17: qty 1

## 2012-07-17 MED ORDER — FUROSEMIDE 10 MG/ML IJ SOLN
40.0000 mg | Freq: Once | INTRAMUSCULAR | Status: AC
  Administered 2012-07-17: 40 mg via INTRAVENOUS
  Filled 2012-07-17: qty 4

## 2012-07-17 NOTE — Progress Notes (Signed)
IV removed and not replaced per Donielle, PA.

## 2012-07-17 NOTE — Progress Notes (Addendum)
                   [address]            [place] [id number]          [phone]      6 Days Post-Op Procedure(s) (LRB): CORONARY ARTERY BYPASS GRAFTING (CABG) (N/A)  Subjective: Patient states she needs surgery on one of her knees, but needs to recover from heart surgery prior. Only other complaint is decreased appetite. No nausea or emesis this am.  Objective: Vital signs in last 24 hours: Temp:  [98.1 F (36.7 C)-98.7 F (37.1 C)] 98.4 F (36.9 C) (05/25 0500) Pulse Rate:  [68-83] 68 (05/25 0500) Cardiac Rhythm:  [-] Normal sinus rhythm (05/24 1935) Resp:  [18-19] 18 (05/25 0500) BP: (94-124)/(39-75) 124/75 mmHg (05/25 0500) SpO2:  [97 %-100 %] 100 % (05/25 0500) Weight:  [104.373 kg (230 lb 1.6 oz)] 104.373 kg (230 lb 1.6 oz) (05/25 0500)  Pre op weight 101 kg Current Weight  07/17/12 104.373 kg (230 lb 1.6 oz)      Intake/Output from previous day: 05/24 0701 - 05/25 0700 In: 240 [P.O.:240] Out: 402 [Urine:401; Stool:1]  Physical Exam:  Cardiovascular: RRR, no murmurs, gallops, or rubs. Pulmonary: Slightly diminished at bases; no rales, wheezes, or rhonchi. Abdomen: Soft, non tender, bowel sounds present. Extremities: Mild bilateral lower extremity edema. Wounds: Clean and dry.  No erythema or signs of infection.  Lab Results: CBC:  Recent Labs  07/16/12 0610  WBC 8.4  HGB 8.3*  HCT 25.6*  PLT 203   BMET:   Recent Labs  07/16/12 0610  NA 136  K 4.4  CL 102  CO2 22  GLUCOSE 162*  BUN 23  CREATININE 0.97  CALCIUM 8.8    PT/INR:  Lab Results  Component Value Date   INR 1.28 07/11/2012   INR 1.00 07/10/2012   INR 1.1* 06/30/2012   ABG:  INR: Will add last result for INR, ABG once components are confirmed Will add last 4 CBG results once components are confirmed  Assessment/Plan:  1. CV - Had 2 brief runs of NSVT 5/24. Maintaining SR. On Coreg 6.25 bid and Benicar 10 daily. HR in the 60's and SBP 120's or less. Will  decrease Coreg to pre op dose of 3.125 bid.Will also decrease Benicar to 5 , but may need to stop if SBP somewhat labile. 2.  Pulmonary - Encourage incentive spirometer 3. Volume Overload - Continue with diuresis but will give Lasix IV 4.  Acute blood loss anemia - Last H and H 8.3 and 25.6. Will restart Iron upon discharge. 5.DM-CBGs 187/130/113 .Continue Byetta 10 bid, Amaryl 2 daily, Actos 30 daily,Metformin 1000 bid, and Insulin with meals and at bedtime. Pre op HGA1C 6.3 6.Zofran PRN nausea 7.Likely discharge in am if feeling better  ZIMMERMAN,DONIELLE MPA-C 07/17/2012,7:56 AM   Chart reviewed, patient examined, agree with above. 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

## 2012-07-17 NOTE — Progress Notes (Signed)
Pt refused to ambulate this PM stating that her knees were to week  to walk.

## 2012-07-18 MED ORDER — TRAMADOL HCL 50 MG PO TABS: 50.0000 mg | ORAL_TABLET | Freq: Four times a day (QID) | ORAL | Status: DC

## 2012-07-18 MED ORDER — TRAMADOL HCL 50 MG PO TABS: 50.0000 mg | ORAL_TABLET | Freq: Four times a day (QID) | ORAL | Status: DC | PRN

## 2012-07-18 MED ORDER — ACETAMINOPHEN 325 MG PO TABS
650.0000 mg | ORAL_TABLET | Freq: Four times a day (QID) | ORAL | Status: DC | PRN
  Administered 2012-07-18: 650 mg via ORAL
  Filled 2012-07-18: qty 2

## 2012-07-18 MED ORDER — FUROSEMIDE 40 MG PO TABS: 40.0000 mg | ORAL_TABLET | Freq: Every day | ORAL | Status: DC

## 2012-07-18 MED ORDER — POTASSIUM CHLORIDE CRYS ER 20 MEQ PO TBCR: 20.0000 meq | EXTENDED_RELEASE_TABLET | Freq: Every day | ORAL | Status: DC

## 2012-07-18 MED ORDER — ASPIRIN 325 MG PO TBEC: 325.0000 mg | DELAYED_RELEASE_TABLET | Freq: Every day | ORAL | Status: DC

## 2012-07-18 MED ORDER — ACETAMINOPHEN 325 MG PO TABS: 650.0000 mg | ORAL_TABLET | Freq: Four times a day (QID) | ORAL | Status: DC | PRN

## 2012-07-18 NOTE — Progress Notes (Signed)
PT Cancellation Note  Patient Details Name: Ashlee [name] MRN: [id number] DOB: 1940/08/03   Cancelled Treatment:    Reason Eval/Treat Not Completed: Other (comment).  Patient politely declined PT, as she was preparing for discharge.  Patient to have f/u HHPT.   Vena Austria 07/18/2012, 12:23 PM Durenda Hurt. Renaldo Fiddler, [facility] Acute Rehab Services Pager [phone]

## 2012-07-18 NOTE — Progress Notes (Addendum)
                   [address]            [place] [address]          [phone]      7 Days Post-Op Procedure(s) (LRB): CORONARY ARTERY BYPASS GRAFTING (CABG) (N/A)  Subjective: Patient states she needs surgery on one of her knees, but needs to recover from heart surgery prior. She does have arthritis. Other than her knees, she has no complaints and wants to go home.  Objective: Vital signs in last 24 hours: Temp:  [97.8 F (36.6 C)-98.6 F (37 C)] 98.6 F (37 C) (05/26 0412) Pulse Rate:  [69-81] 78 (05/26 0412) Cardiac Rhythm:  [-] Normal sinus rhythm (05/25 1935) Resp:  [16-20] 20 (05/26 0412) BP: (104-127)/(42-68) 104/42 mmHg (05/26 0412) SpO2:  [96 %-99 %] 96 % (05/26 0412) Weight:  [103.1 kg (227 lb 4.7 oz)] 103.1 kg (227 lb 4.7 oz) (05/26 0412)  Pre op weight 101 kg Current Weight  07/18/12 103.1 kg (227 lb 4.7 oz)      Intake/Output from previous day: 05/25 0701 - 05/26 0700 In: 610 [P.O.:610] Out: 1000 [Urine:1000]  Physical Exam:  Cardiovascular: RRR, no murmurs, gallops, or rubs. Pulmonary: Slightly diminished at bases; no rales, wheezes, or rhonchi. Abdomen: Soft, non tender, bowel sounds present. Extremities: Mild bilateral lower extremity edema. Wounds: Clean and dry.  No erythema or signs of infection.  Lab Results: CBC:  Recent Labs  07/16/12 0610  WBC 8.4  HGB 8.3*  HCT 25.6*  PLT 203   BMET:   Recent Labs  07/16/12 0610  NA 136  K 4.4  CL 102  CO2 22  GLUCOSE 162*  BUN 23  CREATININE 0.97  CALCIUM 8.8    PT/INR:  Lab Results  Component Value Date   INR 1.28 07/11/2012   INR 1.00 07/10/2012   INR 1.1* 06/30/2012   ABG:  INR: Will add last result for INR, ABG once components are confirmed Will add last 4 CBG results once components are confirmed  Assessment/Plan:  1. CV - Had 2 brief runs of NSVT 5/24. Maintaining SR. On Coreg 3.125 bid and Benicar 5 daily. SBP in low 100's. Will stop Benicar and resume  as outpatient when BP improves. 2.  Pulmonary - Encourage incentive spirometer 3. Volume Overload - Continue with daily po Lasix for one week upon discharge 4.  Acute blood loss anemia - Last H and H 8.3 and 25.6. Will restart Iron upon discharge. 5.DM-CBGs 187/130/113 .Continue Byetta 10 bid, Amaryl 2 daily, Actos 30 daily,Metformin 1000 bid, and Insulin with meals and at bedtime. Pre op HGA1C 6.3 6.Discharge today  Nickola Lenig MPA-C 07/18/2012,7:40 AM

## 2012-07-18 NOTE — Progress Notes (Signed)
Patient given and reviewed with discharge instructions, home medications picked up from pharmacy and given to patient. Will discharge home with husband all questions answered. Ashlee [name], Randall An rN

## 2012-07-19 ENCOUNTER — Telehealth: Payer: Self-pay

## 2012-07-19 ENCOUNTER — Encounter: Payer: Self-pay | Admitting: Thoracic Surgery (Cardiothoracic Vascular Surgery)

## 2012-07-19 NOTE — Telephone Encounter (Signed)
RX's for Lasix 40 mg 1 tab po q day X 7, potassium 20 meq 1 po q d x 7 and Tramadol 50 mg #40 no refills called to pt's pharm. [facility] on [address]

## 2012-07-26 ENCOUNTER — Other Ambulatory Visit (HOSPITAL_COMMUNITY): Payer: Medicare Other

## 2012-07-29 DIAGNOSIS — I2510 Atherosclerotic heart disease of native coronary artery without angina pectoris: Secondary | ICD-10-CM

## 2012-08-01 ENCOUNTER — Inpatient Hospital Stay (HOSPITAL_COMMUNITY): Admission: RE | Admit: 2012-08-01 | Payer: Medicare Other | Source: Ambulatory Visit | Admitting: Orthopedic Surgery

## 2012-08-01 ENCOUNTER — Encounter (HOSPITAL_COMMUNITY): Admission: RE | Payer: Self-pay | Source: Ambulatory Visit

## 2012-08-01 SURGERY — ARTHROPLASTY, KNEE, TOTAL
Anesthesia: General | Laterality: Left

## 2012-08-04 ENCOUNTER — Other Ambulatory Visit: Payer: Self-pay | Admitting: *Deleted

## 2012-08-04 DIAGNOSIS — I2510 Atherosclerotic heart disease of native coronary artery without angina pectoris: Secondary | ICD-10-CM

## 2012-08-05 ENCOUNTER — Encounter: Payer: Medicare Other | Admitting: Physician Assistant

## 2012-08-09 ENCOUNTER — Encounter: Payer: Self-pay | Admitting: Thoracic Surgery (Cardiothoracic Vascular Surgery)

## 2012-08-09 ENCOUNTER — Ambulatory Visit (INDEPENDENT_AMBULATORY_CARE_PROVIDER_SITE_OTHER): Payer: Self-pay | Admitting: Thoracic Surgery (Cardiothoracic Vascular Surgery)

## 2012-08-09 ENCOUNTER — Ambulatory Visit
Admission: RE | Admit: 2012-08-09 | Discharge: 2012-08-09 | Disposition: A | Discharge status: Home / Self Care | Payer: Medicare Other | Source: Ambulatory Visit | Attending: Thoracic Surgery (Cardiothoracic Vascular Surgery) | Admitting: Thoracic Surgery (Cardiothoracic Vascular Surgery)

## 2012-08-09 VITALS — BP 144/75 | HR 84 | Resp 16 | Ht 63.0 in | Wt 219.0 lb

## 2012-08-09 DIAGNOSIS — I2510 Atherosclerotic heart disease of native coronary artery without angina pectoris: Secondary | ICD-10-CM

## 2012-08-09 DIAGNOSIS — Z951 Presence of aortocoronary bypass graft: Secondary | ICD-10-CM

## 2012-08-09 MED ORDER — POTASSIUM CHLORIDE CRYS ER 20 MEQ PO TBCR: 20.0000 meq | EXTENDED_RELEASE_TABLET | Freq: Every day | ORAL | Status: DC

## 2012-08-09 MED ORDER — FUROSEMIDE 40 MG PO TABS: 40.0000 mg | ORAL_TABLET | Freq: Every day | ORAL | Status: DC

## 2012-08-09 NOTE — Progress Notes (Signed)
HPI:  Ashlee [name] is a 72 year old woman who presents for a scheduled postoperative followup visit. She underwent coronary artery bypass grafting x4 on May 19 for severe three-vessel coronary disease with ischemic cardiomyopathy. Her ejection fraction was 25%.  Her postoperative course was uncomplicated.  She is doing well from a pain standpoint. She does complain of some itching her on the chest incision. She does have some swelling in her legs and get short of breath when she lies flat on her back. She's not had any anginal-type pain.   Current Outpatient Prescriptions  Medication Sig Dispense Refill  . acetaminophen (TYLENOL) 325 MG tablet Take 2 tablets (650 mg total) by mouth every 6 (six) hours as needed for pain.      Marland Kitchen aspirin EC 325 MG EC tablet Take 1 tablet (325 mg total) by mouth daily.  30 tablet  0  . atorvastatin (LIPITOR) 80 MG tablet Take 40 mg by mouth daily.      . Bepotastine Besilate (BEPREVE) 1.5 % SOLN Place 1 drop into both eyes 2 (two) times daily.      . carvedilol (COREG) 3.125 MG tablet Take 1 tablet (3.125 mg total) by mouth 2 (two) times daily.  180 tablet  3  . ergocalciferol (VITAMIN D2) 50000 UNITS capsule Take 50,000 Units by mouth once a week.       Marland Kitchen exenatide (BYETTA 10 MCG PEN) 10 MCG/0.04ML SOLN bid      . Ferrous Sulfate (IRON) 325 (65 FE) MG TABS Take 1 tablet by mouth daily.      Marland Kitchen glimepiride (AMARYL) 4 MG tablet Take 2 mg by mouth daily before breakfast.      . glucose blood (SMARTEST TEST) test strip 1 each by Other route 2 (two) times daily. Use as instructed      . insulin NPH (HUMULIN N,NOVOLIN N) 100 UNIT/ML injection Inject 5 Units into the skin at bedtime.       . metFORMIN (GLUCOPHAGE) 1000 MG tablet Take 1,000 mg by mouth 2 (two) times daily with a meal.      . Multiple Vitamins-Minerals (CENTRUM SILVER ADULT 50+ PO) Take 1 tablet by mouth daily.      . pioglitazone (ACTOS) 30 MG tablet Take 30 mg by mouth daily.        . traMADol  (ULTRAM) 50 MG tablet Take 1 tablet (50 mg total) by mouth every 6 (six) hours as needed for pain.  40 tablet  0  . vitamin B-12 (CYANOCOBALAMIN) 1000 MCG tablet Take 1,000 mcg by mouth daily.      . furosemide (LASIX) 40 MG tablet Take 1 tablet (40 mg total) by mouth daily. For one week then stop.  7 tablet  0  . potassium chloride SA (K-DUR,KLOR-CON) 20 MEQ tablet Take 1 tablet (20 mEq total) by mouth daily. For one week then stop.  7 tablet  0   No current facility-administered medications for this visit.    Physical Exam BP 144/75  Pulse 84  Resp 16  Ht 5\' 3"  (1.6 m)  Wt 219 lb (99.338 kg)  BMI 38.8 kg/m2  SpO72 8% 72 year old obese woman in no acute distress Neurologic alert and oriented x3 no focal deficits Cardiac regular rate and rhythm normal S1 and S2 Lungs diminished breath sounds left base Sternum stable, incision clean dry and intact Leg incisions healing well 2+ peripheral edema  Diagnostic Tests: Chest x-ray shows some platelike atelectasis in the left lung. Possible mild elevation of left  hemidiaphragm.  Impression: 72 year old woman who underwent coronary bypass grafting x4 three-vessel disease with ischemic cardiomyopathy about a month ago. Overall she is doing pretty well at this point in time. She is volume overloaded. She had Lasix at the time of discharge but off of that for about a week now. We will restart her Lasix and potassium. She's having minimal discomfort. I think she is okay to drive, discussed the appropriate precautions with her in detail. She still she now is taking over 10 pounds for another 2 weeks. Beyond that her activities are unrestricted.  She has an appointment with Dr. Jens Som tomorrow. She also has an appointment coming up with Dr. Leslie Dales to discuss her diabetes management  Plan: She'll continue to be followed by Dr. Jens Som Dr. Altheimer I would be happy to see her back any time if I can be of any further assistance with her  care.  She knows to call if she has any issues with which I can be of assistance.

## 2012-08-09 NOTE — Patient Instructions (Signed)
You may drive with the precautions we discussed  Do not drive after taking pain medication  Do not lift anything over 10 pounds for another 2 weeks

## 2012-08-10 ENCOUNTER — Encounter: Payer: Self-pay | Admitting: Physician Assistant

## 2012-08-10 ENCOUNTER — Ambulatory Visit (INDEPENDENT_AMBULATORY_CARE_PROVIDER_SITE_OTHER): Payer: Medicare Other | Admitting: Physician Assistant

## 2012-08-10 VITALS — BP 116/78 | HR 73 | Ht 63.5 in | Wt 203.8 lb

## 2012-08-10 DIAGNOSIS — I255 Ischemic cardiomyopathy: Secondary | ICD-10-CM

## 2012-08-10 DIAGNOSIS — I10 Essential (primary) hypertension: Secondary | ICD-10-CM

## 2012-08-10 DIAGNOSIS — I2589 Other forms of chronic ischemic heart disease: Secondary | ICD-10-CM

## 2012-08-10 DIAGNOSIS — IMO0001 Reserved for inherently not codable concepts without codable children: Secondary | ICD-10-CM | POA: Insufficient documentation

## 2012-08-10 DIAGNOSIS — I25810 Atherosclerosis of coronary artery bypass graft(s) without angina pectoris: Secondary | ICD-10-CM

## 2012-08-10 DIAGNOSIS — R9430 Abnormal result of cardiovascular function study, unspecified: Secondary | ICD-10-CM

## 2012-08-10 DIAGNOSIS — E119 Type 2 diabetes mellitus without complications: Secondary | ICD-10-CM

## 2012-08-10 DIAGNOSIS — I2510 Atherosclerotic heart disease of native coronary artery without angina pectoris: Secondary | ICD-10-CM

## 2012-08-10 MED ORDER — CARVEDILOL 6.25 MG PO TABS: 6.2500 mg | ORAL_TABLET | Freq: Two times a day (BID) | ORAL | Status: DC

## 2012-08-10 NOTE — Progress Notes (Signed)
HPI:   This is a 72 year old African American female patient of Dr. Jens Som who is status post CABG x4 with a LIMA to the LAD, SVG to the PDA, SVG to the first diagonal and OM 2 on 07/11/12. She also has severe LV dysfunction ejection fraction 20- 25%. She was seen by the surgeons yesterday and all of those found to be in fluid overload. Her Lasix was resumed as well as her potassium. She says she develop orthopnea and leg swelling over the past couple weeks. She took her first Lasix dose yesterday and lost 4 pounds on her scales. Overall she has done well and is trying to start walking more. She denies any chest pain, palpitations, dizziness, or presyncope. She has not started her metformin back since discharge. Her blood sugars are running about 140.  Allergies:  -- Demerol -- Nausea And Vomiting   --  Perfuse vomitting  -- Percodan (Oxycodone-Aspirin) -- Nausea And Vomiting   --  Perfuse vomitting  Current Outpatient Prescriptions on File Prior to Visit: acetaminophen (TYLENOL) 325 MG tablet, Take 2 tablets (650 mg total) by mouth every 6 (six) hours as needed for pain., Disp: , Rfl:  aspirin EC 325 MG EC tablet, Take 1 tablet (325 mg total) by mouth daily., Disp: 30 tablet, Rfl: 0 atorvastatin (LIPITOR) 80 MG tablet, Take 40 mg by mouth daily., Disp: , Rfl:  Bepotastine Besilate (BEPREVE) 1.5 % SOLN, Place 1 drop into both eyes 2 (two) times daily., Disp: , Rfl:  ergocalciferol (VITAMIN D2) 50000 UNITS capsule, Take 50,000 Units by mouth once a week. , Disp: , Rfl:  exenatide (BYETTA 10 MCG PEN) 10 MCG/0.04ML SOLN, bid, Disp: , Rfl:  Ferrous Sulfate (IRON) 325 (65 FE) MG TABS, Take 1 tablet by mouth daily., Disp: , Rfl:  furosemide (LASIX) 40 MG tablet, Take 1 tablet (40 mg total) by mouth daily., Disp: 30 tablet, Rfl: 5 glimepiride (AMARYL) 4 MG tablet, Take 2 mg by mouth daily before breakfast., Disp: , Rfl:  glucose blood (SMARTEST TEST) test strip, 1 each by Other route 2 (two) times  daily. Use as instructed, Disp: , Rfl:  insulin NPH (HUMULIN N,NOVOLIN N) 100 UNIT/ML injection, Inject 5 Units into the skin at bedtime. , Disp: , Rfl:  metFORMIN (GLUCOPHAGE) 1000 MG tablet, Take 1,000 mg by mouth 2 (two) times daily with a meal., Disp: , Rfl:  Multiple Vitamins-Minerals (CENTRUM SILVER ADULT 50+ PO), Take 1 tablet by mouth daily., Disp: , Rfl:  pioglitazone (ACTOS) 30 MG tablet, Take 30 mg by mouth daily.  , Disp: , Rfl:  potassium chloride SA (K-DUR,KLOR-CON) 20 MEQ tablet, Take 1 tablet (20 mEq total) by mouth daily., Disp: 30 tablet, Rfl: 5 traMADol (ULTRAM) 50 MG tablet, Take 1 tablet (50 mg total) by mouth every 6 (six) hours as needed for pain., Disp: 40 tablet, Rfl: 0 vitamin B-12 (CYANOCOBALAMIN) 1000 MCG tablet, Take 1,000 mcg by mouth daily., Disp: , Rfl:   No current facility-administered medications on file prior to visit.   Past Medical History:   CAD (coronary artery disease)                   1998           Comment:stent post heart attack   Hypertension  Hyperlipidemia                                               Myocardial infarction                           1998         PONV (postoperative nausea and vomiting)                     Shortness of breath                                            Comment:"occasionally; could happen at any time"               (07/06/2012)   DM type 2 (diabetes mellitus, type 2)                        Iron deficiency anemia                                       Migraines                                                      Comment:"ages 11 thru 26; associated w/menstral cycle"               (07/06/2012)   Arthritis                                                      Comment:"in my knees" (07/06/2012)  Past Surgical History:   TUBAL LIGATION                                                TONSILLECTOMY                                    1952         CORONARY ANGIOPLASTY WITH  STENT PLACEMENT        1998           Comment:LAD/notes 07/07/2012   CARDIAC CATHETERIZATION                          07/06/2012    SHOULDER HEMI-ARTHROPLASTY                      Right ?2009          Comment:"fell and crushed it" (07/06/2012)   SHOULDER ARTHROSCOPY  Right 07/25/2007       Comment:exam under anesthesia; arthroscopic biceps               tenotomy; partial labral tear debridement; spur              excision/notes 07/25/2007 (07/06/2012)   REDUCTION MAMMAPLASTY                                         BREAST BIOPSY                                   Right                Comment:"thought they saw something real tiny; it was a              stitch left by OR when breast reduced"               (07/06/2012)   CORONARY ARTERY BYPASS GRAFT                    N/A 07/11/2012      Comment:Procedure: CORONARY ARTERY BYPASS GRAFTING               (CABG);  Surgeon: Loreli Slot, MD;                Location: [facility];  Service: Open Heart Surgery;               Laterality: N/A;  x4, using left internal               mammary and right greater saphenous vein.   Review of patient's family history indicates:   Heart attack                   Mother                     Comment: MI at age 7   Breast cancer                  Sister                   Throat cancer                  Brother                  Lung cancer                    Brother                  Prostate cancer                Father                   Hypertension                   Mother                   Social History   Marital Status: Married             Spouse Name:                      Years  of Education:                 Number of children:             Occupational History   None on file  Social History Main Topics   Smoking Status: Never Smoker                     Smokeless Status: Never Used                       Alcohol Use: Yes               Comment: 07/06/2012 "glass of wine 1-2X/year"    Drug Use: No             Sexual Activity: Not Currently      Other Topics            Concern   None on file  Social History Narrative   None on file    ROS: See history of present illness otherwise negative   PHYSICAL EXAM: Well-nournished, in no acute distress. Neck: Increased JVD, HJR, no Bruit, or thyroid enlargement  Lungs: Decreased breath sounds at the bases left greater than right with a few crackles  Cardiovascular: Incision healing well, RRR, PMI not displaced, heart sounds normal, no murmurs, gallops, bruit, thrill, or heave.  Abdomen: BS normal. Soft without organomegaly, masses, lesions or tenderness.  Extremities: +1 bilateral lower extremity edema up to her knees, without cyanosis, clubbing. Good distal pulses bilateral  SKin: Warm, no lesions or rashes   Musculoskeletal: No deformities  Neuro: no focal signs  BP 116/78  Pulse 73  Ht 5' 3.5" (1.613 m)  Wt 203 lb 12.8 oz (92.443 kg)  BMI 35.53 kg/m2   EKG: Normal sinus rhythm with first degree AV block nonspecific ST-T wave changes  2Decho 06/2012:Study Conclusions  - Left ventricle: Systolic function was severely reduced.   The estimated ejection fraction was in the range of 20% to   25%. - Aortic valve: No evidence of vegetation. - Mitral valve: Mild regurgitation. - Left atrium: No evidence of thrombus in the appendage. Intraoperative transesophageal echocardiography.

## 2012-08-10 NOTE — Assessment & Plan Note (Signed)
Stable

## 2012-08-10 NOTE — Assessment & Plan Note (Signed)
Patient's ejection fraction 20-25%. She does have fluid overload and Lasix was resumed yesterday. Have advised 2 g sodium diet as well.

## 2012-08-10 NOTE — Patient Instructions (Addendum)
Please increase you Carvedilol to 6.25 mg twice a day Continue all other medications as listed.  Call Dr Altheimer about restarting your Metformin.  [phone]  Please use a 2 gm Sodium Diet.  Follow up with Crenshaw in 2 months.  2 Gram Low Sodium Diet A 2 gram sodium diet restricts the amount of sodium in the diet to no more than 2 g or 2000 mg daily. Limiting the amount of sodium is often used to help lower blood pressure. It is important if you have heart, liver, or kidney problems. Many foods contain sodium for flavor and sometimes as a preservative. When the amount of sodium in a diet needs to be low, it is important to know what to look for when choosing foods and drinks. The following includes some information and guidelines to help make it easier for you to adapt to a low sodium diet. QUICK TIPS  Do not add salt to food.  Avoid convenience items and fast food.  Choose unsalted snack foods.  Buy lower sodium products, often labeled as "lower sodium" or "no salt added."  Check food labels to learn how much sodium is in 1 serving.  When eating at a restaurant, ask that your food be prepared with less salt or none, if possible. READING FOOD LABELS FOR SODIUM INFORMATION The nutrition facts label is a good place to find how much sodium is in foods. Look for products with no more than 500 to 600 mg of sodium per meal and no more than 150 mg per serving. Remember that 2 g = 2000 mg. The food label may also list foods as:  Sodium-free: Less than 5 mg in a serving.  Very low sodium: 35 mg or less in a serving.  Low-sodium: 140 mg or less in a serving.  Light in sodium: 50% less sodium in a serving. For example, if a food that usually has 300 mg of sodium is changed to become light in sodium, it will have 150 mg of sodium.  Reduced sodium: 25% less sodium in a serving. For example, if a food that usually has 400 mg of sodium is changed to reduced sodium, it will have 300 mg of  sodium. CHOOSING FOODS Grains  Avoid: Salted crackers and snack items. Some cereals, including instant hot cereals. Bread stuffing and biscuit mixes. Seasoned rice or pasta mixes.  Choose: Unsalted snack items. Low-sodium cereals, oats, puffed wheat and rice, shredded wheat. English muffins and bread. Pasta. Meats  Avoid: Salted, canned, smoked, spiced, pickled meats, including fish and poultry. Bacon, ham, sausage, cold cuts, hot dogs, anchovies.  Choose: Low-sodium canned tuna and salmon. Fresh or frozen meat, poultry, and fish. Dairy  Avoid: Processed cheese and spreads. Cottage cheese. Buttermilk and condensed milk. Regular cheese.  Choose: Milk. Low-sodium cottage cheese. Yogurt. Sour cream. Low-sodium cheese. Fruits and Vegetables  Avoid: Regular canned vegetables. Regular canned tomato sauce and paste. Frozen vegetables in sauces. Olives. Rosita Fire. Relishes. Sauerkraut.  Choose: Low-sodium canned vegetables. Low-sodium tomato sauce and paste. Frozen or fresh vegetables. Fresh and frozen fruit. Condiments  Avoid: Canned and packaged gravies. Worcestershire sauce. Tartar sauce. Barbecue sauce. Soy sauce. Steak sauce. Ketchup. Onion, garlic, and table salt. Meat flavorings and tenderizers.  Choose: Fresh and dried herbs and spices. Low-sodium varieties of mustard and ketchup. Lemon juice. Tabasco sauce. Horseradish. SAMPLE 2 GRAM SODIUM MEAL PLAN Breakfast / Sodium (mg)  1 cup low-fat milk / 143 mg  2 slices whole-wheat toast / 270 mg  1 tbs heart-healthy margarine / 153 mg  1 hard-boiled egg / 139 mg  1 small orange / 0 mg Lunch / Sodium (mg)  1 cup raw carrots / 76 mg   cup hummus / 298 mg  1 cup low-fat milk / 143 mg   cup red grapes / 2 mg  1 whole-wheat pita bread / 356 mg Dinner / Sodium (mg)  1 cup whole-wheat pasta / 2 mg  1 cup low-sodium tomato sauce / 73 mg  3 oz lean ground beef / 57 mg  1 small side salad (1 cup raw spinach leaves,  cup  cucumber,  cup yellow bell pepper) with 1 tsp olive oil and 1 tsp red wine vinegar / 25 mg Snack / Sodium (mg)  1 container low-fat vanilla yogurt / 107 mg  3 graham cracker squares / 127 mg Nutrient Analysis  Calories: 2033  Protein: 77 g  Carbohydrate: 282 g  Fat: 72 g  Sodium: 1971 mg Document Released: 02/09/2005 Document Revised: 05/04/2011 Document Reviewed: 05/13/2009 Exit[employer] Patient Information 2014 [place].

## 2012-08-10 NOTE — Assessment & Plan Note (Signed)
Patient status post CABG x4 in 06/2012. She is doing well postop except for some fluid overload. Will increase Coreg 6.25 mg twice a day

## 2012-08-10 NOTE — Assessment & Plan Note (Signed)
Patient's metformin was held prior to cardiac catheterization. This was never resumed and her blood sugars are running about 140. I've asked her to contact Dr. Kyra Searles and to advise resuming her metformin.

## 2012-09-20 ENCOUNTER — Telehealth: Payer: Self-pay | Admitting: Cardiology

## 2012-09-20 DIAGNOSIS — R609 Edema, unspecified: Secondary | ICD-10-CM

## 2012-09-20 NOTE — Telephone Encounter (Signed)
New problem    C/O both ankles are swollen.

## 2012-09-20 NOTE — Telephone Encounter (Signed)
Follow Up ° ° °Pt returning call from earlier. Please call back. °

## 2012-09-20 NOTE — Telephone Encounter (Signed)
Spoke with pt, Aware of dr crenshaw's recommendations.  °

## 2012-09-20 NOTE — Telephone Encounter (Signed)
Left message for pt to call.

## 2012-09-20 NOTE — Telephone Encounter (Signed)
Spoke with pt, she is having swelling in her feet and ankles. They are swollen in the morning and they get worse by the end of the day. She is unable to get her shoes on at the end of the day. She is having occ SOB and she was told by dr Dorris Fetch she still had some congestion in her lungs. She is cont to do the breathing exercises.  She reports some problems breathing at night but is not waking up SOB. Her weight is down today to 205. She is watching her salt as best as she can. Her bp today was 116/68. She has a congested cough of clear sputum. She is not taking furosemide anymore and is not interested in taking it again because she feels it is effecting her hearing. She wants to know what else she can take for the fluid. Her follow up with dr Jens Som is in sept. Will forward for dr Jens Som review

## 2012-09-20 NOTE — Telephone Encounter (Signed)
She needs to resume lasix 40 mg daily and KCL 20 meq daily with BMET one week Olga Millers

## 2012-09-27 ENCOUNTER — Other Ambulatory Visit (INDEPENDENT_AMBULATORY_CARE_PROVIDER_SITE_OTHER): Payer: Medicare Other

## 2012-09-27 DIAGNOSIS — R609 Edema, unspecified: Secondary | ICD-10-CM

## 2012-09-27 LAB — BASIC METABOLIC PANEL WITH GFR
BUN: 11 mg/dL (ref 6–23)
CO2: 28 meq/L (ref 19–32)
Calcium: 9.6 mg/dL (ref 8.4–10.5)
Chloride: 102 meq/L (ref 96–112)
Creatinine, Ser: 0.9 mg/dL (ref 0.4–1.2)
GFR: 82.93 mL/min
Glucose, Bld: 193 mg/dL — ABNORMAL HIGH (ref 70–99)
Potassium: 4.4 meq/L (ref 3.5–5.1)
Sodium: 137 meq/L (ref 135–145)

## 2012-09-28 ENCOUNTER — Other Ambulatory Visit: Payer: Self-pay | Admitting: *Deleted

## 2012-09-28 MED ORDER — FUROSEMIDE 40 MG PO TABS: 40.0000 mg | ORAL_TABLET | Freq: Every day | ORAL | Status: DC

## 2012-10-27 ENCOUNTER — Encounter: Payer: Self-pay | Admitting: Cardiology

## 2012-10-27 ENCOUNTER — Telehealth: Payer: Self-pay | Admitting: Cardiology

## 2012-10-27 ENCOUNTER — Ambulatory Visit (INDEPENDENT_AMBULATORY_CARE_PROVIDER_SITE_OTHER): Payer: Medicare Other | Admitting: Cardiology

## 2012-10-27 VITALS — BP 96/60 | HR 87 | Wt 196.0 lb

## 2012-10-27 DIAGNOSIS — I429 Cardiomyopathy, unspecified: Secondary | ICD-10-CM

## 2012-10-27 DIAGNOSIS — I428 Other cardiomyopathies: Secondary | ICD-10-CM

## 2012-10-27 MED ORDER — CARVEDILOL 3.125 MG PO TABS: 3.1250 mg | ORAL_TABLET | Freq: Two times a day (BID) | ORAL | Status: DC

## 2012-10-27 NOTE — Assessment & Plan Note (Addendum)
Patient's LV function was reduced prior to revascularization. There was viability on MRI. Plan repeat echocardiogram to see if LV function has improved. If ejection fraction less than 35% we'll need to consider ICD. Her blood pressure is borderline. I will add Coreg 3.125 mg by mouth twice a day. We will add an ACE inhibitor later if tolerated by blood pressure. Continue Lasix. Discontinue HCTZ. Check potassium and renal function in one week.

## 2012-10-27 NOTE — Telephone Encounter (Signed)
Spoke with pt, she wanted to confirm with dr Jens Som she is taking crestor 10 mg daily. She was made aware to cont the same dosage.

## 2012-10-27 NOTE — Progress Notes (Signed)
Pleasant female for fu of CAD. Pt had PCI of LAD in 1998 following MI. Carotid dopplers 5/14 revealed no obstructive disease. Nuclear study in March of 2014 showed anteroseptal and apical infarct. Her ejection fraction had decreased to 23%. Cardiac catheterization subsequently performed in may of 2014. The LAD was occluded. There was a 50% diagonal with an inferior sub-branch that had a 90% lesion. There is an 80% circumflex. There is a 50% RCA followed by a 99% lesion. There is a distal 99% before the bifurcation into the PDA. Cardiac MRI in May 2014 showed severe left ventricular enlargement with an ejection fraction of 34%. There was subendocardial scar in the distal anterior wall, septum and apical wall suggesting viability. There was moderate left atrial enlargement and mild right atrial enlargement.Patient subsequently had coronary artery bypassing graft with a LIMA to the LAD, sequential saphenous vein graft to the diagonal and first marginal and a saphenous vein graft to the PDA. Since she was last seen in June of 2014, she denies chest pain, dyspnea. No syncope. She has had pain in her left Achilles.   Current Outpatient Prescriptions  Medication Sig Dispense Refill  . aspirin 81 MG tablet Take 81 mg by mouth daily.      . Bepotastine Besilate (BEPREVE) 1.5 % SOLN Place 1 drop into both eyes 2 (two) times daily.      . CRESTOR 10 MG tablet Take 1 tablet by mouth daily.      . ergocalciferol (VITAMIN D2) 50000 UNITS capsule Take 50,000 Units by mouth once a week.       Marland Kitchen exenatide (BYETTA 10 MCG PEN) 10 MCG/0.04ML SOLN bid      . Ferrous Sulfate (IRON) 325 (65 FE) MG TABS Take 1 tablet by mouth daily.      . furosemide (LASIX) 40 MG tablet Take 1 tablet (40 mg total) by mouth daily.  30 tablet  12  . glimepiride (AMARYL) 2 MG tablet Take 2 mg by mouth daily before breakfast.      . glucose blood (SMARTEST TEST) test strip 1 each by Other route 2 (two) times daily. Use as instructed      .  hydrochlorothiazide (HYDRODIURIL) 25 MG tablet Take 25 mg by mouth daily.      . insulin NPH (HUMULIN N,NOVOLIN N) 100 UNIT/ML injection Inject 5 Units into the skin at bedtime.       . metFORMIN (GLUCOPHAGE) 1000 MG tablet Take 1,000 mg by mouth 2 (two) times daily with a meal.      . Multiple Vitamins-Minerals (CENTRUM SILVER ADULT 50+ PO) Take 1 tablet by mouth daily.      . pioglitazone (ACTOS) 30 MG tablet Take 30 mg by mouth daily.        . potassium chloride SA (K-DUR,KLOR-CON) 20 MEQ tablet Take 1 tablet (20 mEq total) by mouth daily.  30 tablet  5  . vitamin B-12 (CYANOCOBALAMIN) 1000 MCG tablet Take 1,000 mcg by mouth daily.      . traMADol (ULTRAM) 50 MG tablet Take 1 tablet (50 mg total) by mouth every 6 (six) hours as needed for pain.  40 tablet  0   No current facility-administered medications for this visit.     Past Medical History  Diagnosis Date  . CAD (coronary artery disease) 1998    stent post heart attack  . Hypertension   . Hyperlipidemia   . Myocardial infarction 1998  . PONV (postoperative nausea and vomiting)   . Shortness  of breath     "occasionally; could happen at any time" (07/06/2012)  . DM type 2 (diabetes mellitus, type 2)   . Iron deficiency anemia   . Migraines     "ages 73 thru 61; associated w/menstral cycle" (07/06/2012)  . Arthritis     "in my knees" (07/06/2012)    Past Surgical History  Procedure Laterality Date  . Tubal ligation    . Tonsillectomy  1952  . Coronary angioplasty with stent placement  1998    LAD/notes 07/07/2012  . Cardiac catheterization  07/06/2012  . Shoulder hemi-arthroplasty Right ?2009    "fell and crushed it" (07/06/2012)  . Shoulder arthroscopy Right 07/25/2007    exam under anesthesia; arthroscopic biceps tenotomy; partial labral tear debridement; spur excision/notes 07/25/2007 (07/06/2012)  . Reduction mammaplasty    . Breast biopsy Right     "thought they saw something real tiny; it was a stitch left by OR when  breast reduced" (07/06/2012)  . Coronary artery bypass graft N/A 07/11/2012    Procedure: CORONARY ARTERY BYPASS GRAFTING (CABG);  Surgeon: Loreli Slot, MD;  Location: [facility] OR;  Service: Open Heart Surgery;  Laterality: N/A;  x4, using left internal mammary and right greater saphenous vein.     History   Social History  . Marital Status: Married    Spouse Name: N/A    Number of Children: N/A  . Years of Education: N/A   Occupational History  . Not on file.   Social History Main Topics  . Smoking status: Never Smoker   . Smokeless tobacco: Never Used  . Alcohol Use: Yes     Comment: 07/06/2012 "glass of wine 1-2X/year"  . Drug Use: No  . Sexual Activity: Not Currently   Other Topics Concern  . Not on file   Social History Narrative  . No narrative on file    ROS: pain in left Achilles but no fevers or chills, productive cough, hemoptysis, dysphasia, odynophagia, melena, hematochezia, dysuria, hematuria, rash, seizure activity, orthopnea, PND, pedal edema, claudication. Remaining systems are negative.  Physical Exam: Well-developed well-nourished in no acute distress.  Skin is warm and dry.  HEENT is normal.  Neck is supple.  Chest is clear to auscultation with normal expansion. Previous sternotomy. Cardiovascular exam is regular rate and rhythm.  Abdominal exam nontender or distended. No masses palpated. Extremities show no edema. Her left Achilles is tender to palpation. neuro grossly intact

## 2012-10-27 NOTE — Assessment & Plan Note (Signed)
Continue present dose of Crestor. I will not advance dose as she had myalgias with Lipitor previously.

## 2012-10-27 NOTE — Telephone Encounter (Signed)
New Prob     Pt is calling to verify the dosage of her CRESTOR. Please call.

## 2012-10-27 NOTE — Patient Instructions (Addendum)
Your physician recommends that you schedule a follow-up appointment in: 3 MONTHS WITH DR CRENSHAW  START CARVEDILOL 3.125 MG ONE TABLET TWICE DAILY  Your physician has requested that you have an echocardiogram. Echocardiography is a painless test that uses sound waves to create images of your heart. It provides your doctor with information about the size and shape of your heart and how well your heart's chambers and valves are working. This procedure takes approximately one hour. There are no restrictions for this procedure.   Your physician recommends that you return for lab work WITH ECHO- NON-FASTING  STOP HCTZ

## 2012-10-27 NOTE — Assessment & Plan Note (Signed)
Continue aspirin and statin. 

## 2012-11-02 ENCOUNTER — Telehealth: Payer: Self-pay | Admitting: Cardiology

## 2012-11-02 NOTE — Telephone Encounter (Signed)
Spoke with patient who states Dr. Jens Som told her to stop a medication and she wanted to be certain it was HCTZ that she is supposed to stop.  I advised patient of her discharge instructions from last ov on 9/4 which state patient is to STOP HCTZ and start Carvedilol 3.125 mg.  Patient states she has started taking the Carvedilol.  Patient verbalized understanding of instructions and expressed gratitude.

## 2012-11-02 NOTE — Telephone Encounter (Signed)
New Problem  Pt has medication inquires about metformin. Request a call back

## 2012-11-07 ENCOUNTER — Other Ambulatory Visit (INDEPENDENT_AMBULATORY_CARE_PROVIDER_SITE_OTHER): Payer: Medicare Other

## 2012-11-07 ENCOUNTER — Ambulatory Visit (HOSPITAL_COMMUNITY): Payer: Medicare Other | Attending: Cardiology | Admitting: Radiology

## 2012-11-07 ENCOUNTER — Other Ambulatory Visit (HOSPITAL_COMMUNITY): Payer: Medicare Other | Admitting: Cardiology

## 2012-11-07 DIAGNOSIS — I428 Other cardiomyopathies: Secondary | ICD-10-CM | POA: Insufficient documentation

## 2012-11-07 DIAGNOSIS — E785 Hyperlipidemia, unspecified: Secondary | ICD-10-CM | POA: Insufficient documentation

## 2012-11-07 DIAGNOSIS — I429 Cardiomyopathy, unspecified: Secondary | ICD-10-CM

## 2012-11-07 DIAGNOSIS — R0789 Other chest pain: Secondary | ICD-10-CM | POA: Insufficient documentation

## 2012-11-07 DIAGNOSIS — R0989 Other specified symptoms and signs involving the circulatory and respiratory systems: Secondary | ICD-10-CM | POA: Insufficient documentation

## 2012-11-07 DIAGNOSIS — I10 Essential (primary) hypertension: Secondary | ICD-10-CM | POA: Insufficient documentation

## 2012-11-07 DIAGNOSIS — R011 Cardiac murmur, unspecified: Secondary | ICD-10-CM | POA: Insufficient documentation

## 2012-11-07 DIAGNOSIS — I2510 Atherosclerotic heart disease of native coronary artery without angina pectoris: Secondary | ICD-10-CM

## 2012-11-07 DIAGNOSIS — I25810 Atherosclerosis of coronary artery bypass graft(s) without angina pectoris: Secondary | ICD-10-CM

## 2012-11-07 DIAGNOSIS — E119 Type 2 diabetes mellitus without complications: Secondary | ICD-10-CM | POA: Insufficient documentation

## 2012-11-07 DIAGNOSIS — I252 Old myocardial infarction: Secondary | ICD-10-CM | POA: Insufficient documentation

## 2012-11-07 LAB — BASIC METABOLIC PANEL
GFR: 84.04 mL/min (ref >60.00)
Glucose, Bld: 115 mg/dL — ABNORMAL HIGH (ref 70–99)
Potassium: 3.9 mEq/L (ref 3.5–5.1)
Sodium: 137 mEq/L (ref 135–145)

## 2012-11-07 MED ORDER — PERFLUTREN PROTEIN A MICROSPH IV SUSP
1.5000 mL | Freq: Once | INTRAVENOUS | Status: AC
  Administered 2012-11-07: 1.5 mL via INTRAVENOUS

## 2012-11-07 NOTE — Progress Notes (Signed)
Echocardiogram performed with [facility].  

## 2012-11-15 ENCOUNTER — Telehealth: Payer: Self-pay | Admitting: Cardiology

## 2012-11-15 NOTE — Telephone Encounter (Signed)
Returned call to patient echo and lab results given.Advised schedulers will call to schedule follow up appointment with Dr.Crenshaw.Patient stated she is having swelling in both ankles and feet left worse.Stated she is not taking lasix.(

## 2012-11-15 NOTE — Telephone Encounter (Signed)
Still having problem with leg and foot, dr Jens Som told her to see an orthopedist, also wants results of test                  [phone]

## 2012-11-15 NOTE — Telephone Encounter (Signed)
Patient stated she is not taking lasix and is not taking hctz.Dr.Crenshaw's 10/27/12 office note says to continue lasix and stop hctz.Patient will start back taking lasix.Schedulers will call to schedule follow up appointment with Dr.Crenshaw.Message sent to [facility] for review.

## 2012-11-30 ENCOUNTER — Encounter: Payer: Self-pay | Admitting: Cardiology

## 2012-11-30 ENCOUNTER — Ambulatory Visit (INDEPENDENT_AMBULATORY_CARE_PROVIDER_SITE_OTHER): Payer: Medicare Other | Admitting: Cardiology

## 2012-11-30 VITALS — BP 126/74 | HR 90 | Ht 63.5 in | Wt 204.8 lb

## 2012-11-30 DIAGNOSIS — I429 Cardiomyopathy, unspecified: Secondary | ICD-10-CM

## 2012-11-30 DIAGNOSIS — I2510 Atherosclerotic heart disease of native coronary artery without angina pectoris: Secondary | ICD-10-CM

## 2012-11-30 DIAGNOSIS — I428 Other cardiomyopathies: Secondary | ICD-10-CM

## 2012-11-30 MED ORDER — SPIRONOLACTONE 25 MG PO TABS: 25.0000 mg | ORAL_TABLET | Freq: Every day | ORAL | Status: DC

## 2012-11-30 MED ORDER — LISINOPRIL 2.5 MG PO TABS: 2.5000 mg | ORAL_TABLET | Freq: Every day | ORAL | Status: DC

## 2012-11-30 NOTE — Assessment & Plan Note (Signed)
Continue present blood pressure medications. 

## 2012-11-30 NOTE — Assessment & Plan Note (Signed)
Continue aspirin and statin. 

## 2012-11-30 NOTE — Assessment & Plan Note (Signed)
Continue statin. 

## 2012-11-30 NOTE — Patient Instructions (Signed)
Your physician wants you to follow-up in: 3 MONTHS WITH DR Jens Som You will receive a reminder letter in the mail two months in advance. If you don't receive a letter, please call our office to schedule the follow-up appointment.   STOP POTASSIUM  START LISINOPRIL 2.5 MG ONCE DAILY  START SPIRONOLACTONE 25 MG ONCE DAILY  Your physician recommends that you return for lab work in: ONE WEEK  REFERRAL TO EP TO DISCUSS ICD

## 2012-11-30 NOTE — Assessment & Plan Note (Signed)
Continue low-dose Coreg. Add lisinopril 2.5 mg daily. Mildly volume overloaded. Continue present dose of Lasix. Add spironolactone 25 mg daily. Discontinue potassium. Check potassium and renal function in one week. Her LV function remains poor. Ejection fraction less than 35%. Schedule electrophysiology evaluation for consideration of ICD.

## 2012-11-30 NOTE — Progress Notes (Signed)
HPI: Pleasant female for fu of CAD. Pt had PCI of LAD in 1998 following MI. Carotid dopplers 5/14 revealed no obstructive disease. Nuclear study in March of 2014 showed anteroseptal and apical infarct. Her ejection fraction had decreased to 23%. Cardiac catheterization subsequently performed in May of 2014. The LAD was occluded. There was a 50% diagonal with an inferior sub-branch that had a 90% lesion. There is an 80% circumflex. There is a 50% RCA followed by a 99% lesion. There is a distal 99% before the bifurcation into the PDA. Cardiac MRI in May 2014 showed severe left ventricular enlargement with an ejection fraction of 34%. There was subendocardial scar in the distal anterior wall, septum and apical wall suggesting viability. There was moderate left atrial enlargement and mild right atrial enlargement. Patient subsequently had coronary artery bypassing graft with a LIMA to the LAD, sequential saphenous vein graft to the diagonal and first marginal and a saphenous vein graft to the PDA. Echocardiogram repeated in September of 2014. Ejection fraction was 30-35% with grade 2 diastolic dysfunction. He was mild left atrial enlargement and moderately elevated pulmonary pressures. Since she was last seen, she has some dyspnea on exertion. Mild orthopnea and occasional pedal edema. No chest pain or syncope.   Current Outpatient Prescriptions  Medication Sig Dispense Refill  . aspirin 81 MG tablet Take 81 mg by mouth daily.      . Bepotastine Besilate (BEPREVE) 1.5 % SOLN Place 1 drop into both eyes 2 (two) times daily.      . carvedilol (COREG) 3.125 MG tablet Take 1 tablet (3.125 mg total) by mouth 2 (two) times daily.  30 tablet  12  . CRESTOR 10 MG tablet Take 1 tablet by mouth daily.      . ergocalciferol (VITAMIN D2) 50000 UNITS capsule Take 50,000 Units by mouth once a week.       Marland Kitchen exenatide (BYETTA 10 MCG PEN) 10 MCG/0.04ML SOLN bid      . Ferrous Sulfate (IRON) 325 (65 FE) MG TABS Take  1 tablet by mouth daily.      . furosemide (LASIX) 40 MG tablet Take 1 tablet (40 mg total) by mouth daily.  30 tablet  12  . glimepiride (AMARYL) 2 MG tablet Take 2 mg by mouth daily before breakfast.      . glucose blood (SMARTEST TEST) test strip 1 each by Other route 2 (two) times daily. Use as instructed      . insulin NPH (HUMULIN N,NOVOLIN N) 100 UNIT/ML injection Inject 5 Units into the skin at bedtime.       . metFORMIN (GLUCOPHAGE) 1000 MG tablet Take 1,000 mg by mouth 2 (two) times daily with a meal.      . Multiple Vitamins-Minerals (CENTRUM SILVER ADULT 50+ PO) Take 1 tablet by mouth daily.      . pioglitazone (ACTOS) 30 MG tablet Take 30 mg by mouth daily.        . potassium chloride SA (K-DUR,KLOR-CON) 20 MEQ tablet Take 1 tablet (20 mEq total) by mouth daily.  30 tablet  5  . traMADol (ULTRAM) 50 MG tablet Take 1 tablet (50 mg total) by mouth every 6 (six) hours as needed for pain.  40 tablet  0  . vitamin B-12 (CYANOCOBALAMIN) 1000 MCG tablet Take 1,000 mcg by mouth daily.       No current facility-administered medications for this visit.     Past Medical History  Diagnosis Date  .  CAD (coronary artery disease) 1998    stent post heart attack  . Hypertension   . Hyperlipidemia   . Myocardial infarction 1998  . PONV (postoperative nausea and vomiting)   . Shortness of breath     "occasionally; could happen at any time" (07/06/2012)  . DM type 2 (diabetes mellitus, type 2)   . Iron deficiency anemia   . Migraines     "ages 89 thru 63; associated w/menstral cycle" (07/06/2012)  . Arthritis     "in my knees" (07/06/2012)    Past Surgical History  Procedure Laterality Date  . Tubal ligation    . Tonsillectomy  1952  . Coronary angioplasty with stent placement  1998    LAD/notes 07/07/2012  . Cardiac catheterization  07/06/2012  . Shoulder hemi-arthroplasty Right ?2009    "fell and crushed it" (07/06/2012)  . Shoulder arthroscopy Right 07/25/2007    exam under  anesthesia; arthroscopic biceps tenotomy; partial labral tear debridement; spur excision/notes 07/25/2007 (07/06/2012)  . Reduction mammaplasty    . Breast biopsy Right     "thought they saw something real tiny; it was a stitch left by OR when breast reduced" (07/06/2012)  . Coronary artery bypass graft N/A 07/11/2012    Procedure: CORONARY ARTERY BYPASS GRAFTING (CABG);  Surgeon: Loreli Slot, MD;  Location: [facility] OR;  Service: Open Heart Surgery;  Laterality: N/A;  x4, using left internal mammary and right greater saphenous vein.     History   Social History  . Marital Status: Married    Spouse Name: N/A    Number of Children: N/A  . Years of Education: N/A   Occupational History  . Not on file.   Social History Main Topics  . Smoking status: Never Smoker   . Smokeless tobacco: Never Used  . Alcohol Use: Yes     Comment: 07/06/2012 "glass of wine 1-2X/year"  . Drug Use: No  . Sexual Activity: Not Currently   Other Topics Concern  . Not on file   Social History Narrative  . No narrative on file    ROS: no fevers or chills, productive cough, hemoptysis, dysphasia, odynophagia, melena, hematochezia, dysuria, hematuria, rash, seizure activity, orthopnea, PND, pedal edema, claudication. Remaining systems are negative.  Physical Exam: Well-developed well-nourished in no acute distress.  Skin is warm and dry.  HEENT is normal.  Neck is supple.  Chest is clear to auscultation with normal expansion.  Cardiovascular exam is regular rate and rhythm.  Abdominal exam nontender or distended. No masses palpated. Extremities show 1+ ankle edema on the left neuro grossly intact  ECG sinus rhythm at a rate of 90. First degree AV block. Lateral T-wave inversion.

## 2012-12-07 ENCOUNTER — Other Ambulatory Visit (INDEPENDENT_AMBULATORY_CARE_PROVIDER_SITE_OTHER): Payer: Medicare Other

## 2012-12-07 DIAGNOSIS — I428 Other cardiomyopathies: Secondary | ICD-10-CM

## 2012-12-07 DIAGNOSIS — I429 Cardiomyopathy, unspecified: Secondary | ICD-10-CM

## 2012-12-07 DIAGNOSIS — I2510 Atherosclerotic heart disease of native coronary artery without angina pectoris: Secondary | ICD-10-CM

## 2012-12-07 LAB — BASIC METABOLIC PANEL
BUN: 12 mg/dL (ref 6–23)
CO2: 28 mEq/L (ref 19–32)
Calcium: 9.2 mg/dL (ref 8.4–10.5)
Creatinine, Ser: 0.8 mg/dL (ref 0.4–1.2)
GFR: 85.17 mL/min (ref >60.00)
Glucose, Bld: 96 mg/dL (ref 70–99)

## 2012-12-16 ENCOUNTER — Encounter: Payer: Self-pay | Admitting: Internal Medicine

## 2012-12-16 ENCOUNTER — Ambulatory Visit (INDEPENDENT_AMBULATORY_CARE_PROVIDER_SITE_OTHER): Payer: Medicare Other | Admitting: Internal Medicine

## 2012-12-16 VITALS — BP 137/76 | HR 88 | Ht 63.5 in | Wt 201.4 lb

## 2012-12-16 DIAGNOSIS — I2589 Other forms of chronic ischemic heart disease: Secondary | ICD-10-CM

## 2012-12-16 DIAGNOSIS — I255 Ischemic cardiomyopathy: Secondary | ICD-10-CM

## 2012-12-16 DIAGNOSIS — I428 Other cardiomyopathies: Secondary | ICD-10-CM

## 2012-12-16 DIAGNOSIS — I429 Cardiomyopathy, unspecified: Secondary | ICD-10-CM

## 2012-12-16 NOTE — Patient Instructions (Addendum)
Your physician has requested that you have an echocardiogram. Echocardiography is a painless test that uses sound waves to create images of your heart. It provides your doctor with information about the size and shape of your heart and how well your heart's chambers and valves are working. This procedure takes approximately one hour. There are no restrictions for this procedure.SCHEDULE IN LATE DECEMBER   Your physician recommends that you return for lab work in:ONE WEEK

## 2012-12-16 NOTE — Assessment & Plan Note (Signed)
She has an ischemic cardiomyopathy for which he underwent revascularization more than 90 days ago. She has recently had the introduction of ACE inhibitor therapy and Aldactone. It would be my hope that these will result in interval improvement in left ventricular function that may obviate the need for ICD implantation as recurrent ejection fraction is 30-35%. I have reviewed this with her. We'll plan to have her have a 2-D echo in the weeks prior to her 3 month followup with Dr. Marsa Aris in January.  With the introduction of Aldactone we will recheck her potassium in about 2 weeks and then 4 weeks after that.

## 2012-12-16 NOTE — Progress Notes (Signed)
ELECTROPHYSIOLOGY CONSULT NOTE  Patient ID: Ashlee [name], MRN: [id number], DOB/AGE: [birth year] 72 y.o. Admit date: (Not on file) Date of Consult: 12/16/2012  Primary Physician: Junious Silk, MD Primary Cardiologist: [facility]   Chief Complaint:  ICD     HPI Ashlee [name] is a 72 y.o. female \ Seen for consideration of ICD implantation  He has ischemic cardiomyopathy    Her ejection fraction had decreased to 23%. Cardiac catheterization subsequently performed in may of 2014. The LAD was occluded. There was a 50% diagonal with an inferior sub-branch that had a 90% lesion. There is an 80% circumflex. There is a 50% RCA followed by a 99% lesion. There is a distal 99% before the bifurcation into the PDA. Cardiac MRI in May 2014 showed severe left ventricular enlargement with an ejection fraction of 34%  she subsequently had coronary artery bypassing graft with a LIMA to the LAD, sequential saphenous vein graft to the diagonal and first marginal and a saphenous vein graft to the PDA.  Echo 9/14  30-35%;  early October Aldactone and ACE inhibitors were initiated. Potassium levels were normal one week following initiation of Aldactone  She has modest exercise intolerance. She has some peripheral edema. She's had no palpitations or syncope.       Past Medical History  Diagnosis Date  . CAD (coronary artery disease) 1998    stent post heart attack  . Hypertension   . Hyperlipidemia   . Myocardial infarction 1998  . PONV (postoperative nausea and vomiting)   . Shortness of breath     "occasionally; could happen at any time" (07/06/2012)  . DM type 2 (diabetes mellitus, type 2)   . Iron deficiency anemia   . Migraines     "ages 50 thru 61; associated w/menstral cycle" (07/06/2012)  . Arthritis     "in my knees" (07/06/2012)      Surgical History:  Past Surgical History  Procedure Laterality Date  . Tubal ligation    . Tonsillectomy  1952  . Coronary angioplasty with stent  placement  1998    LAD/notes 07/07/2012  . Cardiac catheterization  07/06/2012  . Shoulder hemi-arthroplasty Right ?2009    "fell and crushed it" (07/06/2012)  . Shoulder arthroscopy Right 07/25/2007    exam under anesthesia; arthroscopic biceps tenotomy; partial labral tear debridement; spur excision/notes 07/25/2007 (07/06/2012)  . Reduction mammaplasty    . Breast biopsy Right     "thought they saw something real tiny; it was a stitch left by OR when breast reduced" (07/06/2012)  . Coronary artery bypass graft N/A 07/11/2012    Procedure: CORONARY ARTERY BYPASS GRAFTING (CABG);  Surgeon: Loreli Slot, MD;  Location: [facility];  Service: Open Heart Surgery;  Laterality: N/A;  x4, using left internal mammary and right greater saphenous vein.      Home Meds: Prior to Admission medications   Medication Sig Start Date End Date Taking? Authorizing Provider  aspirin 81 MG tablet Take 81 mg by mouth daily.    Historical Provider, MD  Bepotastine Besilate (BEPREVE) 1.5 % SOLN Place 1 drop into both eyes 2 (two) times daily.    Historical Provider, MD  carvedilol (COREG) 3.125 MG tablet Take 1 tablet (3.125 mg total) by mouth 2 (two) times daily. 10/27/12   Lewayne Bunting, MD  CRESTOR 10 MG tablet Take 1 tablet by mouth daily. 10/18/12   Historical Provider, MD  ergocalciferol (VITAMIN D2) 50000 UNITS capsule Take 50,000 Units by mouth once  a week.  11/20/10   Historical Provider, MD  exenatide (BYETTA 10 MCG PEN) 10 MCG/0.04ML SOLN bid    Historical Provider, MD  Ferrous Sulfate (IRON) 325 (65 FE) MG TABS Take 1 tablet by mouth daily.    Historical Provider, MD  furosemide (LASIX) 40 MG tablet Take 1 tablet (40 mg total) by mouth daily. 09/28/12   Lewayne Bunting, MD  glimepiride (AMARYL) 2 MG tablet Take 2 mg by mouth daily before breakfast.    Historical Provider, MD  glucose blood (SMARTEST TEST) test strip 1 each by Other route 2 (two) times daily. Use as instructed    Historical Provider, MD    insulin NPH (HUMULIN N,NOVOLIN N) 100 UNIT/ML injection Inject 5 Units into the skin at bedtime.     Historical Provider, MD  lisinopril (PRINIVIL,ZESTRIL) 2.5 MG tablet Take 1 tablet (2.5 mg total) by mouth daily. 11/30/12   Lewayne Bunting, MD  metFORMIN (GLUCOPHAGE) 1000 MG tablet Take 1,000 mg by mouth 2 (two) times daily with a meal.    Historical Provider, MD  Multiple Vitamins-Minerals (CENTRUM SILVER ADULT 50+ PO) Take 1 tablet by mouth daily.    Historical Provider, MD  pioglitazone (ACTOS) 30 MG tablet Take 30 mg by mouth daily.      Historical Provider, MD  spironolactone (ALDACTONE) 25 MG tablet Take 1 tablet (25 mg total) by mouth daily. 11/30/12   Lewayne Bunting, MD  traMADol (ULTRAM) 50 MG tablet Take 1 tablet (50 mg total) by mouth every 6 (six) hours as needed for pain. 07/18/12   Donielle Margaretann Loveless, PA-C  vitamin B-12 (CYANOCOBALAMIN) 1000 MCG tablet Take 1,000 mcg by mouth daily.    Historical Provider, MD    On Allergies:  Allergies  Allergen Reactions  . Demerol Nausea And Vomiting    Perfuse vomitting  . Percodan [Oxycodone-Aspirin] Nausea And Vomiting    Perfuse vomitting    History   Social History  . Marital Status: Married    Spouse Name: N/A    Number of Children: N/A  . Years of Education: N/A   Occupational History  . Not on file.   Social History Main Topics  . Smoking status: Never Smoker   . Smokeless tobacco: Never Used  . Alcohol Use: Yes     Comment: 07/06/2012 "glass of wine 1-2X/year"  . Drug Use: No  . Sexual Activity: Not Currently   Other Topics Concern  . Not on file   Social History Narrative  . No narrative on file     Family History  Problem Relation Age of Onset  . Heart attack Mother     MI at age 23  . Breast cancer Sister   . Throat cancer Brother   . Lung cancer Brother   . Prostate cancer Father   . Hypertension Mother      ROS:  Please see the history of present illness.    All other systems reviewed and  negative.    Physical Exam: Blood pressure 137/76, pulse 88, height 5' 3.5" (1.613 m), weight 201 lb 6.4 oz (91.354 kg). General: Well developed, well nourished female in no acute distress. Head: Normocephalic, atraumatic, sclera non-icteric, no xanthomas, nares are without discharge. EENT: normal Lymph Nodes:  none Back: without scoliosis/kyphosis, no CVA tendersness Neck: Negative for carotid bruits. JVD not elevated. Lungs: Clear bilaterally to auscultation there is upper airway wheezing and decreased breath sounds Heart: RRR with S1 S2. 2/6 systolic murmur , rubs, or  gallops appreciated. Abdomen: Soft, non-tender, non-distended with normoactive bowel sounds. No hepatomegaly. No rebound/guarding. No obvious abdominal masses. Msk:  Strength and tone appear normal for age. Extremities: No clubbing or cyanosis. No* edema.  Distal pedal pulses are 2+ and equal bilaterally. Skin: Warm and Dry Neuro: Alert and oriented X 3. CN III-XII intact she is hard of hearing Grossly normal sensory and motor function . Psych:  Responds to questions appropriately with a normal affect.      Labs: Cardiac Enzymes No results found for this basename: CKTOTAL, CKMB, TROPONINI,  in the last 72 hours CBC Lab Results  Component Value Date   WBC 8.4 07/16/2012   HGB 8.3* 07/16/2012   HCT 25.6* 07/16/2012   MCV 82.1 07/16/2012   PLT 203 07/16/2012   PROTIME: No results found for this basename: LABPROT, INR,  in the last 72 hours Chemistry No results found for this basename: NA, K, CL, CO2, BUN, CREATININE, CALCIUM, LABALBU, PROT, BILITOT, ALKPHOS, ALT, AST, GLUCOSE,  in the last 168 hours Lipids Lab Results  Component Value Date   CHOL 177 07/07/2012   HDL 78 07/07/2012   LDLCALC 92 07/07/2012   TRIG 35 07/07/2012   BNP No results found for this basename: probnp   Miscellaneous No results found for this basename: DDIMER    Radiology/Studies:  No results found.  EKG: Sinus rhythm at 90 Intervals  23/09/36   Assessment and Plan:    Sherryl Manges

## 2012-12-20 ENCOUNTER — Telehealth (HOSPITAL_COMMUNITY): Payer: Self-pay | Admitting: Cardiac Rehabilitation

## 2012-12-20 NOTE — Telephone Encounter (Signed)
Pt contacted to enroll in cardiac rehab program. Pt is still undecided.   She is awaiting Dr .Graciela Husbands recommendation for possible ICD placement.  Pt has chronic knee pain and would prefer to do water aerobics.  Pt is interested in exercise program  She realizes the importance of program however she is not able to enroll at this time. Pt would like a return call at later time to reassess

## 2012-12-23 ENCOUNTER — Other Ambulatory Visit (INDEPENDENT_AMBULATORY_CARE_PROVIDER_SITE_OTHER): Payer: Medicare Other

## 2012-12-23 DIAGNOSIS — I429 Cardiomyopathy, unspecified: Secondary | ICD-10-CM

## 2012-12-23 DIAGNOSIS — I428 Other cardiomyopathies: Secondary | ICD-10-CM

## 2012-12-23 LAB — BASIC METABOLIC PANEL
BUN: 9 mg/dL (ref 6–23)
Calcium: 9.3 mg/dL (ref 8.4–10.5)
Creatinine, Ser: 0.8 mg/dL (ref 0.4–1.2)
GFR: 88.81 mL/min (ref >60.00)
Sodium: 138 mEq/L (ref 135–145)

## 2013-01-26 ENCOUNTER — Ambulatory Visit: Payer: Medicare Other | Admitting: Cardiology

## 2013-02-07 ENCOUNTER — Telehealth: Payer: Self-pay | Admitting: Cardiology

## 2013-02-07 NOTE — Telephone Encounter (Signed)
Called stating she has cancelled her mammogram a couple of times because chest was sore from the ICD placement.  Has rescheduled tomorrow and wanted to know if ok to have test.  States her chest is not sore now.  Advised to go ahead and have mammogram.

## 2013-02-07 NOTE — Telephone Encounter (Signed)
New Message  Pt called---wants to have a mamogram completed // She wants to be sure that it is safe.. Please call to discuss

## 2013-02-13 ENCOUNTER — Other Ambulatory Visit: Payer: Self-pay | Admitting: Radiology

## 2013-02-14 ENCOUNTER — Telehealth: Payer: Self-pay | Admitting: *Deleted

## 2013-02-14 NOTE — Telephone Encounter (Signed)
Spoke with pt, we had received mammogram reports about a mass in the right breast. She reports she had the biopsy yesterday and they called her today and told her everything was fine.

## 2013-02-20 ENCOUNTER — Ambulatory Visit (HOSPITAL_COMMUNITY): Payer: Medicare Other | Attending: Cardiology | Admitting: Radiology

## 2013-02-20 ENCOUNTER — Encounter: Payer: Self-pay | Admitting: Cardiology

## 2013-02-20 DIAGNOSIS — I255 Ischemic cardiomyopathy: Secondary | ICD-10-CM

## 2013-02-20 DIAGNOSIS — E119 Type 2 diabetes mellitus without complications: Secondary | ICD-10-CM | POA: Insufficient documentation

## 2013-02-20 DIAGNOSIS — I2510 Atherosclerotic heart disease of native coronary artery without angina pectoris: Secondary | ICD-10-CM | POA: Insufficient documentation

## 2013-02-20 DIAGNOSIS — I252 Old myocardial infarction: Secondary | ICD-10-CM | POA: Insufficient documentation

## 2013-02-20 DIAGNOSIS — I059 Rheumatic mitral valve disease, unspecified: Secondary | ICD-10-CM | POA: Insufficient documentation

## 2013-02-20 DIAGNOSIS — I429 Cardiomyopathy, unspecified: Secondary | ICD-10-CM

## 2013-02-20 DIAGNOSIS — I10 Essential (primary) hypertension: Secondary | ICD-10-CM | POA: Insufficient documentation

## 2013-02-20 DIAGNOSIS — I079 Rheumatic tricuspid valve disease, unspecified: Secondary | ICD-10-CM | POA: Insufficient documentation

## 2013-02-20 DIAGNOSIS — I2589 Other forms of chronic ischemic heart disease: Secondary | ICD-10-CM

## 2013-02-20 DIAGNOSIS — E785 Hyperlipidemia, unspecified: Secondary | ICD-10-CM | POA: Insufficient documentation

## 2013-02-20 DIAGNOSIS — I359 Nonrheumatic aortic valve disorder, unspecified: Secondary | ICD-10-CM | POA: Insufficient documentation

## 2013-02-20 NOTE — Progress Notes (Signed)
Echocardiogram performed.  

## 2013-03-07 ENCOUNTER — Telehealth (HOSPITAL_COMMUNITY): Payer: Self-pay | Admitting: Cardiac Rehabilitation

## 2013-03-07 NOTE — Telephone Encounter (Signed)
Multiple attempts to contact pt via phone and letter mailed.  Pt has not responded to messages since 12/19/12.  MD made aware.

## 2013-03-17 ENCOUNTER — Encounter: Payer: Self-pay | Admitting: Cardiology

## 2013-03-23 ENCOUNTER — Ambulatory Visit (HOSPITAL_COMMUNITY): Payer: Medicare Other

## 2013-03-27 ENCOUNTER — Encounter (HOSPITAL_COMMUNITY): Payer: Medicare Other

## 2013-03-29 ENCOUNTER — Encounter (HOSPITAL_COMMUNITY): Payer: Medicare Other

## 2013-03-30 ENCOUNTER — Encounter (HOSPITAL_COMMUNITY)
Admission: RE | Admit: 2013-03-30 | Discharge: 2013-03-30 | Disposition: A | Discharge status: Home / Self Care | Payer: Medicare Other | Source: Ambulatory Visit | Attending: Cardiovascular Disease | Admitting: Cardiovascular Disease

## 2013-03-30 DIAGNOSIS — Z5189 Encounter for other specified aftercare: Secondary | ICD-10-CM | POA: Insufficient documentation

## 2013-03-30 DIAGNOSIS — Z951 Presence of aortocoronary bypass graft: Secondary | ICD-10-CM | POA: Insufficient documentation

## 2013-03-30 DIAGNOSIS — I2510 Atherosclerotic heart disease of native coronary artery without angina pectoris: Secondary | ICD-10-CM | POA: Insufficient documentation

## 2013-03-30 NOTE — Progress Notes (Signed)
Cardiac Rehab Medication Review by a Pharmacist  Does the patient  feel that his/her medications are working for him/her?  yes  Has the patient been experiencing any side effects to the medications prescribed?  yes  Does the patient measure his/her own blood pressure or blood glucose at home?  yes   Does the patient have any problems obtaining medications due to transportation or finances?   yes  Understanding of regimen: excellent Understanding of indications: good Potential of compliance: excellent  Ashlee [name] 03/30/2013 9:03 AM

## 2013-03-31 ENCOUNTER — Encounter (HOSPITAL_COMMUNITY): Payer: Medicare Other

## 2013-04-03 ENCOUNTER — Encounter (HOSPITAL_COMMUNITY)
Admission: RE | Admit: 2013-04-03 | Discharge: 2013-04-03 | Disposition: A | Discharge status: Home / Self Care | Payer: Medicare Other | Source: Ambulatory Visit | Attending: Cardiovascular Disease | Admitting: Cardiovascular Disease

## 2013-04-03 LAB — GLUCOSE, CAPILLARY
GLUCOSE-CAPILLARY: 92 mg/dL (ref 70–99)
Glucose-Capillary: 109 mg/dL — ABNORMAL HIGH (ref 70–99)
Glucose-Capillary: 81 mg/dL (ref 70–99)

## 2013-04-03 NOTE — Progress Notes (Signed)
Pt in today for her first day of exercise at 11:15 cardiac rehab class.  Pt tolerated exercise with some difficulty noted on the bike with knee discomfort.  Pt switched to the recumbent bike and was unable to complete a rotation with her left knee.  Pt switched to the track which pt tolerated with no complaints.  Pt pre exercise 109.  Pt given banana to eat during warm up.  Post exercise blood glucose 81.  Pt given lemonade and peanut butter crackers.  Recheck in 15 minutes 92. Pt plans to eat lunch when she arrives at home.  Pt advised to eat breakfast as usual and have a snack to eat on her way to exercise.  Verbalized understanding.  Husband called for her to be picked up.  Monitor showed Sr with ST depression and no noted ectopy.  Pt short and long term goal center around losing weight.  Pt encouraged to attend the three nutritional classes on Tuesdays.  Pt is not very active with exercise due to knee problems which are chronic for her.  Will plan to instruct on chair exercises during her home exercise instruction.  PHQ2 0.  Pt feels she has a good support system especially with her husband [name] which she referred to fondly during our verbal exchange.  Continue to monitor.  Alanson Aly, BSN

## 2013-04-05 ENCOUNTER — Encounter (HOSPITAL_COMMUNITY)
Admission: RE | Admit: 2013-04-05 | Discharge: 2013-04-05 | Disposition: A | Discharge status: Home / Self Care | Payer: Medicare Other | Source: Ambulatory Visit | Attending: Cardiovascular Disease | Admitting: Cardiovascular Disease

## 2013-04-05 NOTE — Progress Notes (Signed)
Ashlee [name] 73 y.o. female Nutrition Note Spoke with pt. Pt pre-exercise CBG too low for exercise. Pt states fasting CBG this am 79 mg/dL and fasting CBG's range from 68-114 mg/dL. Pt does not recall her last A1c exactly and states "it was 6-something." Per pt, pre-surgical wt was 227 lb. Pt wt on admission 191.8 lb, which is down 35 lb since 06/2012. Pt trying to lose wt and would like to avoid eating a pre-exercise snack since she ate a "good breakfast." Pt reports eating 3/4 cup Kashi with 1/2 cup 2% milk, 1/2 orange, 3 peanut butter crackers, and water. Pt taking Actos, Metformin, and Glyburide with breakfast. Pt willing to try 1 mg Amaryl at breakfast to see if we can avoid feeding pt prior to exercise. Will notify Dr. Leslie Dales re: trial. Continue client-centered nutrition education by RD as part of interdisciplinary care.  Monitor and evaluate progress toward nutrition goal with team. Mickle Plumb, M.Ed, RD, LDN, CDE 04/05/2013 12:38 PM

## 2013-04-05 NOTE — Progress Notes (Addendum)
Pt CBG-102 upon arrival to cardiac rehab.  Pt states she has eaten cereal this morning with naval  orange prior to arriving at rehab.    Pt took usual meds today as ordered.  Pt did not exercise.  Pt met with Derek Mound, RD, Diabetes Educator to discuss medication and meal planning for exercise.   Pt given handout of appropriate snacks to eat prior to exercise. Understanding verbalized

## 2013-04-06 LAB — GLUCOSE, CAPILLARY
Glucose-Capillary: 102 mg/dL — ABNORMAL HIGH (ref 70–99)
Glucose-Capillary: 114 mg/dL — ABNORMAL HIGH (ref 70–99)

## 2013-04-07 ENCOUNTER — Encounter (HOSPITAL_COMMUNITY)
Admission: RE | Admit: 2013-04-07 | Discharge: 2013-04-07 | Disposition: A | Discharge status: Home / Self Care | Payer: Medicare Other | Source: Ambulatory Visit | Attending: Cardiovascular Disease | Admitting: Cardiovascular Disease

## 2013-04-07 LAB — GLUCOSE, CAPILLARY
GLUCOSE-CAPILLARY: 71 mg/dL (ref 70–99)
Glucose-Capillary: 89 mg/dL (ref 70–99)
Glucose-Capillary: 134 mg/dL — ABNORMAL HIGH (ref 70–99)

## 2013-04-07 NOTE — Progress Notes (Signed)
Ashlee [name] 73 y.o. female Nutrition Note Spoke with pt. Pt pre-exercise CBG too low for exercise. Pt states fasting CBG this am 121 mg/dL Pt now reports she has been taking 1 mg of Glyburide with breakfast "for a while." Pt took 1 mg Amaryl at breakfast this morning. Pre-exercise CBG 71 mg/dL. Will ask pt to bring in her diabetic medication and her pill box. Pt agreed to plan. Continue client-centered nutrition education by RD as part of interdisciplinary care.  Monitor and evaluate progress toward nutrition goal with team. Mickle Plumb, M.Ed, RD, LDN, CDE 04/07/2013 11:53 AM

## 2013-04-07 NOTE — Progress Notes (Signed)
Pt called at home.  Pt arrived safely at home with no issues.  Pt rechecked her medications to ensure she was taking them correctly. Pt plans to bring her bottles in for review on Monday.

## 2013-04-07 NOTE — Progress Notes (Signed)
Pt blood glucose rechecked 89.  Pt given second lemonade and banana.  Pt rechecked 134.  Pt assisted by wheelchair to the ER parking lot where she parked her car after she arrived at the designated parking garage and saw the sign said "full".  Pt unsure when she arrived to the parking area where she had parked her car.  Security escorted pt in their vehicle until she found her car.  Will call pt later today at home.

## 2013-04-10 ENCOUNTER — Encounter (HOSPITAL_COMMUNITY)
Admission: RE | Admit: 2013-04-10 | Discharge: 2013-04-10 | Disposition: A | Discharge status: Home / Self Care | Payer: Medicare Other | Source: Ambulatory Visit | Attending: Cardiovascular Disease | Admitting: Cardiovascular Disease

## 2013-04-10 LAB — GLUCOSE, CAPILLARY
GLUCOSE-CAPILLARY: 72 mg/dL (ref 70–99)
GLUCOSE-CAPILLARY: 117 mg/dL — AB (ref 70–99)

## 2013-04-10 NOTE — Progress Notes (Signed)
Pt arrived early to 11:15 class around 9:20 a.m. Pt brought her medications in to review with the nurse as requested.  Pt takes amaryl 4 mg which she breaks in half.  Pt also take metformin 1000 mg which she also breaks in half.  Pt takes 5-7 units of insulin at bedtime depending what her blood glucose reading.  Pm blood sugars run in the 130's. Pt eats a bedtime snack.  Pt decreased her am medications to half when she was waking up in the middle of the night with low readings.  Pt reported her blood glucose this morning was 93.  Pt had a heavier the an usual breakfast in anticipation of exercise.  Pt did not eat a snack.  Pt blood glucose checked 72. No exercise today per rehab policy.  Pt given lemonade to drink.  After lemonade given, pt given a banana and peanut butter crackers which she had in her purse.  Dr. Leslie Dales office called.  Asked for an appt for pt to be seen to evaluate her diabetes management.  Due to inclement weather, appt scheduled for Thursday 2/19.  Pt instructed not to return to exercise until seen in the office.  Pt verbalized understanding.  Repeat blood glucose 117. Pt instructed to eat when she arrived at home particular something with protein.  Pt husband called to come back and pick her up. Pt felt fine and is in agreement of this. Alanson Aly, BSN

## 2013-04-12 ENCOUNTER — Encounter (HOSPITAL_COMMUNITY): Payer: Medicare Other

## 2013-04-14 ENCOUNTER — Encounter (HOSPITAL_COMMUNITY)
Admission: RE | Admit: 2013-04-14 | Discharge: 2013-04-14 | Disposition: A | Discharge status: Home / Self Care | Payer: Medicare Other | Source: Ambulatory Visit | Attending: Cardiovascular Disease | Admitting: Cardiovascular Disease

## 2013-04-14 LAB — GLUCOSE, CAPILLARY
GLUCOSE-CAPILLARY: 93 mg/dL (ref 70–99)
Glucose-Capillary: 139 mg/dL — ABNORMAL HIGH (ref 70–99)

## 2013-04-14 NOTE — Progress Notes (Signed)
Pt returned today for exercise after follow up appt on Thursday with Dr.Altheimer.  Pt with medication changes to night time dose of insulin the night prior to exercise.  Pt is to hold am dose of Amaryl. Continue Metformin as directed.  Pt pre exercise blood glucose 139 and post reading 93.  Pt able to verbalized what she needed to do with her medications.  Will fax to Dr. Leslie Dales as Lorain Childes.  Pt tolerated 30 minutes of exercise with no complaints.  Pt given an orange to eat on her way out to eat with her husband at [employer]. Alanson Aly, BSN

## 2013-04-17 ENCOUNTER — Encounter (HOSPITAL_COMMUNITY)
Admission: RE | Admit: 2013-04-17 | Discharge: 2013-04-17 | Disposition: A | Discharge status: Home / Self Care | Payer: Medicare Other | Source: Ambulatory Visit | Attending: Cardiovascular Disease | Admitting: Cardiovascular Disease

## 2013-04-17 LAB — GLUCOSE, CAPILLARY
GLUCOSE-CAPILLARY: 156 mg/dL — AB (ref 70–99)
Glucose-Capillary: 103 mg/dL — ABNORMAL HIGH (ref 70–99)

## 2013-04-19 ENCOUNTER — Encounter (HOSPITAL_COMMUNITY)
Admission: RE | Admit: 2013-04-19 | Discharge: 2013-04-19 | Disposition: A | Discharge status: Home / Self Care | Payer: Medicare Other | Source: Ambulatory Visit | Attending: Cardiovascular Disease | Admitting: Cardiovascular Disease

## 2013-04-19 LAB — GLUCOSE, CAPILLARY
Glucose-Capillary: 166 mg/dL — ABNORMAL HIGH (ref 70–99)
Glucose-Capillary: 121 mg/dL — ABNORMAL HIGH (ref 70–99)

## 2013-04-21 ENCOUNTER — Telehealth (HOSPITAL_COMMUNITY): Payer: Self-pay | Admitting: Endocrinology

## 2013-04-21 ENCOUNTER — Encounter (HOSPITAL_COMMUNITY): Payer: Medicare Other

## 2013-04-24 ENCOUNTER — Encounter (HOSPITAL_COMMUNITY)
Admission: RE | Admit: 2013-04-24 | Discharge: 2013-04-24 | Disposition: A | Discharge status: Home / Self Care | Payer: Medicare Other | Source: Ambulatory Visit | Attending: Cardiovascular Disease | Admitting: Cardiovascular Disease

## 2013-04-24 DIAGNOSIS — Z5189 Encounter for other specified aftercare: Secondary | ICD-10-CM | POA: Insufficient documentation

## 2013-04-24 DIAGNOSIS — I2510 Atherosclerotic heart disease of native coronary artery without angina pectoris: Secondary | ICD-10-CM | POA: Insufficient documentation

## 2013-04-24 DIAGNOSIS — Z951 Presence of aortocoronary bypass graft: Secondary | ICD-10-CM | POA: Insufficient documentation

## 2013-04-24 LAB — GLUCOSE, CAPILLARY
GLUCOSE-CAPILLARY: 95 mg/dL (ref 70–99)
Glucose-Capillary: 160 mg/dL — ABNORMAL HIGH (ref 70–99)

## 2013-04-24 NOTE — Progress Notes (Signed)
Reviewed home exercise with pt today.  Pt plans to continue walking at home for exercise.  Reviewed THR, pulse, RPE, sign and symptoms, and when to call 911 or MD.  Pt voiced understanding. Jessica Hawkins, MA, ACSM RCEP  

## 2013-04-26 ENCOUNTER — Encounter (HOSPITAL_COMMUNITY)
Admission: RE | Admit: 2013-04-26 | Discharge: 2013-04-26 | Disposition: A | Discharge status: Home / Self Care | Payer: Medicare Other | Source: Ambulatory Visit | Attending: Cardiovascular Disease | Admitting: Cardiovascular Disease

## 2013-04-26 LAB — GLUCOSE, CAPILLARY
GLUCOSE-CAPILLARY: 103 mg/dL — AB (ref 70–99)
Glucose-Capillary: 155 mg/dL — ABNORMAL HIGH (ref 70–99)

## 2013-04-28 ENCOUNTER — Encounter (HOSPITAL_COMMUNITY)
Admission: RE | Admit: 2013-04-28 | Discharge: 2013-04-28 | Disposition: A | Discharge status: Home / Self Care | Payer: Medicare Other | Source: Ambulatory Visit | Attending: Cardiovascular Disease | Admitting: Cardiovascular Disease

## 2013-04-28 LAB — GLUCOSE, CAPILLARY
GLUCOSE-CAPILLARY: 175 mg/dL — AB (ref 70–99)
GLUCOSE-CAPILLARY: 88 mg/dL (ref 70–99)
Glucose-Capillary: 109 mg/dL — ABNORMAL HIGH (ref 70–99)

## 2013-05-01 ENCOUNTER — Encounter (HOSPITAL_COMMUNITY)
Admission: RE | Admit: 2013-05-01 | Discharge: 2013-05-01 | Disposition: A | Discharge status: Home / Self Care | Payer: Medicare Other | Source: Ambulatory Visit | Attending: Cardiovascular Disease | Admitting: Cardiovascular Disease

## 2013-05-01 LAB — GLUCOSE, CAPILLARY
GLUCOSE-CAPILLARY: 199 mg/dL — AB (ref 70–99)
Glucose-Capillary: 92 mg/dL (ref 70–99)

## 2013-05-03 ENCOUNTER — Encounter (HOSPITAL_COMMUNITY)
Admission: RE | Admit: 2013-05-03 | Discharge: 2013-05-03 | Disposition: A | Discharge status: Home / Self Care | Payer: Medicare Other | Source: Ambulatory Visit | Attending: Cardiovascular Disease | Admitting: Cardiovascular Disease

## 2013-05-03 LAB — GLUCOSE, CAPILLARY
Glucose-Capillary: 117 mg/dL — ABNORMAL HIGH (ref 70–99)
Glucose-Capillary: 187 mg/dL — ABNORMAL HIGH (ref 70–99)

## 2013-05-05 ENCOUNTER — Encounter (HOSPITAL_COMMUNITY)
Admission: RE | Admit: 2013-05-05 | Discharge: 2013-05-05 | Disposition: A | Discharge status: Home / Self Care | Payer: Medicare Other | Source: Ambulatory Visit | Attending: Cardiovascular Disease | Admitting: Cardiovascular Disease

## 2013-05-05 LAB — GLUCOSE, CAPILLARY
GLUCOSE-CAPILLARY: 142 mg/dL — AB (ref 70–99)
Glucose-Capillary: 163 mg/dL — ABNORMAL HIGH (ref 70–99)

## 2013-05-08 ENCOUNTER — Encounter (HOSPITAL_COMMUNITY)
Admission: RE | Admit: 2013-05-08 | Discharge: 2013-05-08 | Disposition: A | Discharge status: Home / Self Care | Payer: Medicare Other | Source: Ambulatory Visit | Attending: Cardiovascular Disease | Admitting: Cardiovascular Disease

## 2013-05-08 LAB — GLUCOSE, CAPILLARY
GLUCOSE-CAPILLARY: 168 mg/dL — AB (ref 70–99)
Glucose-Capillary: 126 mg/dL — ABNORMAL HIGH (ref 70–99)

## 2013-05-10 ENCOUNTER — Telehealth: Payer: Self-pay | Admitting: Cardiology

## 2013-05-10 ENCOUNTER — Encounter (HOSPITAL_COMMUNITY)
Admission: RE | Admit: 2013-05-10 | Discharge: 2013-05-10 | Disposition: A | Discharge status: Home / Self Care | Payer: Medicare Other | Source: Ambulatory Visit | Attending: Cardiovascular Disease | Admitting: Cardiovascular Disease

## 2013-05-10 LAB — GLUCOSE, CAPILLARY
GLUCOSE-CAPILLARY: 154 mg/dL — AB (ref 70–99)
Glucose-Capillary: 94 mg/dL (ref 70–99)

## 2013-05-10 NOTE — Telephone Encounter (Signed)
Spoke with pt, the cough she has is congested and productive of white sputum. It usually occurs at night. Advised pt do not feel the cough is related to the lisinopril at this time. Discussed with pt things she can take otc to help with allergies. She is going to try several things and let us know if she cont to have problems.

## 2013-05-10 NOTE — Progress Notes (Signed)
Pt commented today that she keeps a cough especially at night. Reviewed medications and she is taking Lisinopril 2.5mg  daily. Possible side effect of the medication?   Weight gain increase 3.2kg from Monday to Wednesday. Lungs clear with o2 sat 99 denies any dietary indiscretions. Compliant with lasix 40mg  daily. Will send rehab report by fax to the office for review.  Pt with recent changes in insulin regimen particularly on rehab days of exercise Increase weight be a result of cutting back on insulin dose?  Continue to monitor

## 2013-05-10 NOTE — Telephone Encounter (Signed)
New message     Patient calling C/O cough since Jan . Lisinopril  2.5 mg would like to be causing her to cough .    Went to workshop today they was talking about medication.

## 2013-05-12 ENCOUNTER — Encounter (HOSPITAL_COMMUNITY)
Admission: RE | Admit: 2013-05-12 | Discharge: 2013-05-12 | Disposition: A | Discharge status: Home / Self Care | Payer: Medicare Other | Source: Ambulatory Visit | Attending: Cardiovascular Disease | Admitting: Cardiovascular Disease

## 2013-05-12 LAB — GLUCOSE, CAPILLARY
Glucose-Capillary: 119 mg/dL — ABNORMAL HIGH (ref 70–99)
Glucose-Capillary: 138 mg/dL — ABNORMAL HIGH (ref 70–99)

## 2013-05-15 ENCOUNTER — Encounter (HOSPITAL_COMMUNITY)
Admission: RE | Admit: 2013-05-15 | Discharge: 2013-05-15 | Disposition: A | Discharge status: Home / Self Care | Payer: Medicare Other | Source: Ambulatory Visit | Attending: Cardiovascular Disease | Admitting: Cardiovascular Disease

## 2013-05-15 LAB — GLUCOSE, CAPILLARY
GLUCOSE-CAPILLARY: 125 mg/dL — AB (ref 70–99)
Glucose-Capillary: 223 mg/dL — ABNORMAL HIGH (ref 70–99)

## 2013-05-17 ENCOUNTER — Encounter (HOSPITAL_COMMUNITY)
Admission: RE | Admit: 2013-05-17 | Discharge: 2013-05-17 | Disposition: A | Discharge status: Home / Self Care | Payer: Medicare Other | Source: Ambulatory Visit | Attending: Cardiovascular Disease | Admitting: Cardiovascular Disease

## 2013-05-17 LAB — GLUCOSE, CAPILLARY
Glucose-Capillary: 179 mg/dL — ABNORMAL HIGH (ref 70–99)
Glucose-Capillary: 106 mg/dL — ABNORMAL HIGH (ref 70–99)

## 2013-05-17 NOTE — Progress Notes (Signed)
Ashlee [name] 73 y.o. female Nutrition Note Spoke with pt.  Nutrition Plan and Nutrition Survey goals reviewed with pt. Pt is following Step 2 of the Therapeutic Lifestyle Changes diet. Pt wants to lose wt. Pt has not been actively trying to lose wt "other than making the changes to my diet." Wt loss tips reviewed.  Pt is diabetic. No recent A1c noted. Pt states she checks CBG's 2-3 times daily. Fasting CBG's reportedly 114-121 mg/dL and before bed 125-140 mg/dL. Pt does not take NPH the night before Cardiac Rehab and pt does not take Glimepiride the day of Cardiac Rehab. This Probation officer went over Diabetes Education test results. Pt expressed understanding of the information reviewed. Pt aware of nutrition education classes offered.  Nutrition Diagnosis   Food-and nutrition-related knowledge deficit related to lack of exposure to information as related to diagnosis of: ? CVD ? DM   Obesity related to excessive energy intake as evidenced by a BMI of 35.2  Nutrition RX/ Estimated Daily Nutrition Needs for: wt loss  1200-1500 Kcal, 30-40 gm fat, 9-11 gm sat fat, 1.2-1.5 gm trans-fat, <1500 mg sodium, 150-175 gm CHO   Nutrition Intervention   Pt's individual nutrition plan reviewed with pt.   Benefits of adopting Therapeutic Lifestyle Changes discussed when Medficts reviewed.   Pt to attend the Portion Distortion class - met; 04/19/13   Pt to attend the Diabetes Q & A class - met; 04/07/13   Pt given handouts for: ? Nutrition I class ? Nutrition II class ? Diabetes Blitz class   Continue client-centered nutrition education by RD, as part of interdisciplinary care. Goal(s)   Pt to identify food quantities necessary to achieve: ? wt loss to a goal wt of 168-186 lb (76.3-84.5 kg) at graduation from cardiac rehab.    CBG concentrations in the normal range or as close to normal as is safely possible. Monitor and Evaluate progress toward nutrition goal with team. Nutrition Risk: Change to Moderate Derek Mound, M.Ed, RD, LDN, CDE 05/17/2013 11:23 AM

## 2013-05-19 ENCOUNTER — Encounter (HOSPITAL_COMMUNITY)
Admission: RE | Admit: 2013-05-19 | Discharge: 2013-05-19 | Disposition: A | Discharge status: Home / Self Care | Payer: Medicare Other | Source: Ambulatory Visit | Attending: Cardiovascular Disease | Admitting: Cardiovascular Disease

## 2013-05-19 LAB — GLUCOSE, CAPILLARY
GLUCOSE-CAPILLARY: 196 mg/dL — AB (ref 70–99)
GLUCOSE-CAPILLARY: 107 mg/dL — AB (ref 70–99)

## 2013-05-22 ENCOUNTER — Encounter (HOSPITAL_COMMUNITY)
Admission: RE | Admit: 2013-05-22 | Discharge: 2013-05-22 | Disposition: A | Discharge status: Home / Self Care | Payer: Medicare Other | Source: Ambulatory Visit | Attending: Cardiovascular Disease | Admitting: Cardiovascular Disease

## 2013-05-22 LAB — GLUCOSE, CAPILLARY
Glucose-Capillary: 249 mg/dL — ABNORMAL HIGH (ref 70–99)
Glucose-Capillary: 145 mg/dL — ABNORMAL HIGH (ref 70–99)

## 2013-05-24 ENCOUNTER — Encounter (HOSPITAL_COMMUNITY)
Admission: RE | Admit: 2013-05-24 | Discharge: 2013-05-24 | Disposition: A | Discharge status: Home / Self Care | Payer: Medicare Other | Source: Ambulatory Visit | Attending: Cardiovascular Disease | Admitting: Cardiovascular Disease

## 2013-05-24 DIAGNOSIS — Z951 Presence of aortocoronary bypass graft: Secondary | ICD-10-CM | POA: Insufficient documentation

## 2013-05-24 DIAGNOSIS — I2510 Atherosclerotic heart disease of native coronary artery without angina pectoris: Secondary | ICD-10-CM | POA: Insufficient documentation

## 2013-05-24 DIAGNOSIS — Z5189 Encounter for other specified aftercare: Secondary | ICD-10-CM | POA: Insufficient documentation

## 2013-05-24 LAB — GLUCOSE, CAPILLARY
Glucose-Capillary: 136 mg/dL — ABNORMAL HIGH (ref 70–99)
Glucose-Capillary: 111 mg/dL — ABNORMAL HIGH (ref 70–99)

## 2013-05-26 ENCOUNTER — Encounter (HOSPITAL_COMMUNITY): Payer: Medicare Other

## 2013-05-26 ENCOUNTER — Telehealth (HOSPITAL_COMMUNITY): Payer: Self-pay | Admitting: Endocrinology

## 2013-05-29 ENCOUNTER — Encounter (HOSPITAL_COMMUNITY): Payer: Medicare Other

## 2013-05-29 ENCOUNTER — Encounter: Payer: Self-pay | Admitting: Cardiology

## 2013-05-31 ENCOUNTER — Encounter (HOSPITAL_COMMUNITY)
Admission: RE | Admit: 2013-05-31 | Discharge: 2013-05-31 | Disposition: A | Discharge status: Home / Self Care | Payer: Medicare Other | Source: Ambulatory Visit | Attending: Cardiovascular Disease | Admitting: Cardiovascular Disease

## 2013-05-31 LAB — GLUCOSE, CAPILLARY: Glucose-Capillary: 183 mg/dL — ABNORMAL HIGH (ref 70–99)

## 2013-06-02 ENCOUNTER — Encounter (HOSPITAL_COMMUNITY)
Admission: RE | Admit: 2013-06-02 | Discharge: 2013-06-02 | Disposition: A | Discharge status: Home / Self Care | Payer: Medicare Other | Source: Ambulatory Visit | Attending: Cardiovascular Disease | Admitting: Cardiovascular Disease

## 2013-06-02 LAB — GLUCOSE, CAPILLARY
GLUCOSE-CAPILLARY: 122 mg/dL — AB (ref 70–99)
GLUCOSE-CAPILLARY: 172 mg/dL — AB (ref 70–99)

## 2013-06-05 ENCOUNTER — Encounter (HOSPITAL_COMMUNITY)
Admission: RE | Admit: 2013-06-05 | Discharge: 2013-06-05 | Disposition: A | Discharge status: Home / Self Care | Payer: Medicare Other | Source: Ambulatory Visit | Attending: Cardiovascular Disease | Admitting: Cardiovascular Disease

## 2013-06-05 LAB — GLUCOSE, CAPILLARY
GLUCOSE-CAPILLARY: 199 mg/dL — AB (ref 70–99)
Glucose-Capillary: 99 mg/dL (ref 70–99)

## 2013-06-07 ENCOUNTER — Encounter (HOSPITAL_COMMUNITY)
Admission: RE | Admit: 2013-06-07 | Discharge: 2013-06-07 | Disposition: A | Discharge status: Home / Self Care | Payer: Medicare Other | Source: Ambulatory Visit | Attending: Cardiovascular Disease | Admitting: Cardiovascular Disease

## 2013-06-07 LAB — GLUCOSE, CAPILLARY
Glucose-Capillary: 135 mg/dL — ABNORMAL HIGH (ref 70–99)
Glucose-Capillary: 119 mg/dL — ABNORMAL HIGH (ref 70–99)

## 2013-06-09 ENCOUNTER — Encounter (HOSPITAL_COMMUNITY)
Admission: RE | Admit: 2013-06-09 | Discharge: 2013-06-09 | Disposition: A | Discharge status: Home / Self Care | Payer: Medicare Other | Source: Ambulatory Visit | Attending: Cardiovascular Disease | Admitting: Cardiovascular Disease

## 2013-06-09 LAB — GLUCOSE, CAPILLARY
GLUCOSE-CAPILLARY: 182 mg/dL — AB (ref 70–99)
Glucose-Capillary: 133 mg/dL — ABNORMAL HIGH (ref 70–99)

## 2013-06-12 ENCOUNTER — Encounter (HOSPITAL_COMMUNITY)
Admission: RE | Admit: 2013-06-12 | Discharge: 2013-06-12 | Disposition: A | Discharge status: Home / Self Care | Payer: Medicare Other | Source: Ambulatory Visit | Attending: Cardiovascular Disease | Admitting: Cardiovascular Disease

## 2013-06-12 LAB — GLUCOSE, CAPILLARY
GLUCOSE-CAPILLARY: 89 mg/dL (ref 70–99)
Glucose-Capillary: 206 mg/dL — ABNORMAL HIGH (ref 70–99)

## 2013-06-14 ENCOUNTER — Encounter (HOSPITAL_COMMUNITY)
Admission: RE | Admit: 2013-06-14 | Discharge: 2013-06-14 | Disposition: A | Discharge status: Home / Self Care | Payer: Medicare Other | Source: Ambulatory Visit | Attending: Cardiovascular Disease | Admitting: Cardiovascular Disease

## 2013-06-14 LAB — GLUCOSE, CAPILLARY: Glucose-Capillary: 149 mg/dL — ABNORMAL HIGH (ref 70–99)

## 2013-06-16 ENCOUNTER — Encounter (HOSPITAL_COMMUNITY)
Admission: RE | Admit: 2013-06-16 | Discharge: 2013-06-16 | Disposition: A | Discharge status: Home / Self Care | Payer: Medicare Other | Source: Ambulatory Visit | Attending: Cardiovascular Disease | Admitting: Cardiovascular Disease

## 2013-06-16 LAB — GLUCOSE, CAPILLARY
GLUCOSE-CAPILLARY: 159 mg/dL — AB (ref 70–99)
Glucose-Capillary: 119 mg/dL — ABNORMAL HIGH (ref 70–99)

## 2013-06-19 ENCOUNTER — Encounter (HOSPITAL_COMMUNITY)
Admission: RE | Admit: 2013-06-19 | Discharge: 2013-06-19 | Disposition: A | Discharge status: Home / Self Care | Payer: Medicare Other | Source: Ambulatory Visit | Attending: Cardiovascular Disease | Admitting: Cardiovascular Disease

## 2013-06-19 LAB — GLUCOSE, CAPILLARY
Glucose-Capillary: 250 mg/dL — ABNORMAL HIGH (ref 70–99)
Glucose-Capillary: 103 mg/dL — ABNORMAL HIGH (ref 70–99)

## 2013-06-20 ENCOUNTER — Ambulatory Visit (INDEPENDENT_AMBULATORY_CARE_PROVIDER_SITE_OTHER): Payer: Medicare Other | Admitting: Cardiology

## 2013-06-20 ENCOUNTER — Encounter: Payer: Self-pay | Admitting: Cardiology

## 2013-06-20 ENCOUNTER — Other Ambulatory Visit: Payer: Self-pay | Admitting: *Deleted

## 2013-06-20 VITALS — BP 143/76 | HR 74 | Ht 62.0 in | Wt 196.6 lb

## 2013-06-20 DIAGNOSIS — I10 Essential (primary) hypertension: Secondary | ICD-10-CM

## 2013-06-20 DIAGNOSIS — I2510 Atherosclerotic heart disease of native coronary artery without angina pectoris: Secondary | ICD-10-CM

## 2013-06-20 DIAGNOSIS — I429 Cardiomyopathy, unspecified: Secondary | ICD-10-CM

## 2013-06-20 DIAGNOSIS — E785 Hyperlipidemia, unspecified: Secondary | ICD-10-CM

## 2013-06-20 DIAGNOSIS — I428 Other cardiomyopathies: Secondary | ICD-10-CM

## 2013-06-20 MED ORDER — CARVEDILOL 6.25 MG PO TABS: 6.2500 mg | ORAL_TABLET | Freq: Two times a day (BID) | ORAL | Status: DC

## 2013-06-20 MED ORDER — LOSARTAN POTASSIUM 50 MG PO TABS: 50.0000 mg | ORAL_TABLET | Freq: Every day | ORAL | Status: DC

## 2013-06-20 NOTE — Assessment & Plan Note (Signed)
Plan therapy with beta-blockade and ARB.

## 2013-06-20 NOTE — Assessment & Plan Note (Signed)
Patient has developed a cough. Discontinue lisinopril. Add Cozaar 50 mg daily. Increase carvedilol to 6.25 mg by mouth twice a day. Will review timing of defibrillator with Dr. Graciela Husbands.

## 2013-06-20 NOTE — Assessment & Plan Note (Signed)
Continue statin. 

## 2013-06-20 NOTE — Patient Instructions (Signed)
Your physician wants you to follow-up in: 6 MONTHS WITH DR Jens Som You will receive a reminder letter in the mail two months in advance. If you don't receive a letter, please call our office to schedule the follow-up appointment.   STOP LISINOPRIL  START LOSARTAN 50 MG ONCE DAILY  INCREASE CARVEDILOL TO 6.25 MG TWICE DAILY  Your physician recommends that you return FOR LAB WORK IN ONE WEEK

## 2013-06-20 NOTE — Progress Notes (Signed)
HPI: FU CAD. Pt had PCI of LAD in 1998 following MI. Carotid dopplers 5/14 revealed no obstructive disease. Nuclear study in March of 2014 showed anteroseptal and apical infarct. Her ejection fraction had decreased to 23%. Cardiac catheterization subsequently performed in May of 2014. The LAD was occluded. There was a 50% diagonal with an inferior sub-branch that had a 90% lesion. There is an 80% circumflex. There is a 50% RCA followed by a 99% lesion. There is a distal 99% before the bifurcation into the PDA. Cardiac MRI in May 2014 showed severe left ventricular enlargement with an ejection fraction of 34%. There was subendocardial scar in the distal anterior wall, septum and apical wall suggesting viability. There was moderate left atrial enlargement and mild right atrial enlargement. Patient subsequently had coronary artery bypassing graft with a LIMA to the LAD, sequential saphenous vein graft to the diagonal and first marginal and a saphenous vein graft to the PDA. Echocardiogram repeated in December of 2014. Ejection fraction was 25%; mild AS and mild MR. Since she was last seen, the patient has dyspnea with more extreme activities but not with routine activities. It is relieved with rest. It is not associated with chest pain. There is no orthopnea, PND or pedal edema. There is no syncope or palpitations. There is no exertional chest pain.    Current Outpatient Prescriptions  Medication Sig Dispense Refill  . acetaminophen (TYLENOL) 325 MG tablet Take 325 mg by mouth as needed for mild pain or moderate pain.      Marland Kitchen aspirin 81 MG tablet Take 81 mg by mouth daily.      . carvedilol (COREG) 3.125 MG tablet Take 1 tablet (3.125 mg total) by mouth 2 (two) times daily.  30 tablet  12  . CRESTOR 10 MG tablet Take 1 tablet by mouth daily.      . ergocalciferol (VITAMIN D2) 50000 UNITS capsule Take 50,000 Units by mouth once a week. Take it every wednesday      . exenatide (BYETTA 10 MCG PEN) 10  MCG/0.04ML SOLN Inject 10 mcg into the skin 2 (two) times daily with a meal. bid      . Ferrous Sulfate (IRON) 325 (65 FE) MG TABS Take 1 tablet by mouth daily.      Marland Kitchen glimepiride (AMARYL) 2 MG tablet Take 2 mg by mouth daily before breakfast.      . glucose blood (SMARTEST TEST) test strip 1 each by Other route 2 (two) times daily. Use as instructed      . insulin NPH (HUMULIN N,NOVOLIN N) 100 UNIT/ML injection Inject 5 Units into the skin at bedtime.       Marland Kitchen lisinopril (PRINIVIL,ZESTRIL) 2.5 MG tablet Take 1 tablet (2.5 mg total) by mouth daily.  90 tablet  3  . metFORMIN (GLUCOPHAGE) 1000 MG tablet Take 500 mg by mouth 2 (two) times daily with a meal. Per pt Dr. Emeline General instructed to decrease dosage to 500 mg twice a day      . Multiple Vitamins-Minerals (CENTRUM SILVER ADULT 50+ PO) Take 1 tablet by mouth daily.      . pioglitazone (ACTOS) 30 MG tablet Take 30 mg by mouth daily.        Marland Kitchen spironolactone (ALDACTONE) 25 MG tablet Take 1 tablet (25 mg total) by mouth daily.  90 tablet  3  . vitamin B-12 (CYANOCOBALAMIN) 1000 MCG tablet Take 1,000 mcg by mouth daily.       No current  facility-administered medications for this visit.     Past Medical History  Diagnosis Date  . CAD (coronary artery disease) 1998    stent post heart attack  . Hypertension   . Hyperlipidemia   . Myocardial infarction 1998  . PONV (postoperative nausea and vomiting)   . Shortness of breath     "occasionally; could happen at any time" (07/06/2012)  . DM type 2 (diabetes mellitus, type 2)   . Iron deficiency anemia   . Migraines     "ages [age] thru [age]; associated w/menstral cycle" (07/06/2012)  . Arthritis     "in my knees" (07/06/2012)    Past Surgical History  Procedure Laterality Date  . Tubal ligation    . Tonsillectomy  1952  . Coronary angioplasty with stent placement  1998    LAD/notes 07/07/2012  . Cardiac catheterization  07/06/2012  . Shoulder hemi-arthroplasty Right ?2009    "fell and crushed  it" (07/06/2012)  . Shoulder arthroscopy Right 07/25/2007    exam under anesthesia; arthroscopic biceps tenotomy; partial labral tear debridement; spur excision/notes 07/25/2007 (07/06/2012)  . Reduction mammaplasty    . Breast biopsy Right     "thought they saw something real tiny; it was a stitch left by OR when breast reduced" (07/06/2012)  . Coronary artery bypass graft N/A 07/11/2012    Procedure: CORONARY ARTERY BYPASS GRAFTING (CABG);  Surgeon: Loreli SlotSteven C Hendrickson, MD;  [facility];  Service: Open Heart Surgery;  Laterality: N/A;  x4, using left internal mammary and right greater saphenous vein.     History   Social History  . Marital Status: Married    Spouse Name: N/A    Number of Children: N/A  . Years of Education: N/A   Occupational History  . Not on file.   Social History Main Topics  . Smoking status: Never Smoker   . Smokeless tobacco: Never Used  . Alcohol Use: Yes     Comment: 07/06/2012 "glass of wine 1-2X/year"  . Drug Use: No  . Sexual Activity: Not Currently   Other Topics Concern  . Not on file   Social History Narrative  . No narrative on file    ROS: no fevers or chills, productive cough, hemoptysis, dysphasia, odynophagia, melena, hematochezia, dysuria, hematuria, rash, seizure activity, orthopnea, PND, pedal edema, claudication. Remaining systems are negative.  Physical Exam: Well-developed well-nourished in no acute distress.  Skin is warm and dry.  HEENT is normal.  Neck is supple.  Chest is clear to auscultation with normal expansion.  Cardiovascular exam is regular rate and rhythm.  Abdominal exam nontender or distended. No masses palpated. Extremities show no edema. neuro grossly intact  ECG Sinus rhythm at a rate of 74. First degree AV block. Lateral T-wave inversion.

## 2013-06-20 NOTE — Assessment & Plan Note (Signed)
Continue aspirin and statin. 

## 2013-06-21 ENCOUNTER — Encounter (HOSPITAL_COMMUNITY): Admission: RE | Admit: 2013-06-21 | Payer: Medicare Other | Source: Ambulatory Visit

## 2013-06-23 ENCOUNTER — Encounter (HOSPITAL_COMMUNITY)
Admission: RE | Admit: 2013-06-23 | Discharge: 2013-06-23 | Disposition: A | Discharge status: Home / Self Care | Payer: Medicare Other | Source: Ambulatory Visit | Attending: Cardiovascular Disease | Admitting: Cardiovascular Disease

## 2013-06-23 DIAGNOSIS — I2510 Atherosclerotic heart disease of native coronary artery without angina pectoris: Secondary | ICD-10-CM | POA: Insufficient documentation

## 2013-06-23 DIAGNOSIS — Z5189 Encounter for other specified aftercare: Secondary | ICD-10-CM | POA: Insufficient documentation

## 2013-06-23 DIAGNOSIS — Z951 Presence of aortocoronary bypass graft: Secondary | ICD-10-CM | POA: Insufficient documentation

## 2013-06-23 LAB — GLUCOSE, CAPILLARY
Glucose-Capillary: 115 mg/dL — ABNORMAL HIGH (ref 70–99)
Glucose-Capillary: 126 mg/dL — ABNORMAL HIGH (ref 70–99)

## 2013-06-26 ENCOUNTER — Encounter (HOSPITAL_COMMUNITY)
Admission: RE | Admit: 2013-06-26 | Discharge: 2013-06-26 | Disposition: A | Discharge status: Home / Self Care | Payer: Medicare Other | Source: Ambulatory Visit | Attending: Cardiovascular Disease | Admitting: Cardiovascular Disease

## 2013-06-26 LAB — GLUCOSE, CAPILLARY: GLUCOSE-CAPILLARY: 194 mg/dL — AB (ref 70–99)

## 2013-06-28 ENCOUNTER — Encounter (HOSPITAL_COMMUNITY)
Admission: RE | Admit: 2013-06-28 | Discharge: 2013-06-28 | Disposition: A | Discharge status: Home / Self Care | Payer: Medicare Other | Source: Ambulatory Visit | Attending: Cardiovascular Disease | Admitting: Cardiovascular Disease

## 2013-06-28 LAB — GLUCOSE, CAPILLARY: Glucose-Capillary: 163 mg/dL — ABNORMAL HIGH (ref 70–99)

## 2013-06-30 ENCOUNTER — Encounter (HOSPITAL_COMMUNITY)
Admission: RE | Admit: 2013-06-30 | Discharge: 2013-06-30 | Disposition: A | Discharge status: Home / Self Care | Payer: Medicare Other | Source: Ambulatory Visit | Attending: Cardiovascular Disease | Admitting: Cardiovascular Disease

## 2013-06-30 LAB — GLUCOSE, CAPILLARY: Glucose-Capillary: 176 mg/dL — ABNORMAL HIGH (ref 70–99)

## 2013-07-03 ENCOUNTER — Encounter (HOSPITAL_COMMUNITY)
Admission: RE | Admit: 2013-07-03 | Discharge: 2013-07-03 | Disposition: A | Discharge status: Home / Self Care | Payer: Medicare Other | Source: Ambulatory Visit | Attending: Cardiovascular Disease | Admitting: Cardiovascular Disease

## 2013-07-05 ENCOUNTER — Other Ambulatory Visit (INDEPENDENT_AMBULATORY_CARE_PROVIDER_SITE_OTHER): Payer: Medicare Other

## 2013-07-05 ENCOUNTER — Encounter (HOSPITAL_COMMUNITY)
Admission: RE | Admit: 2013-07-05 | Discharge: 2013-07-05 | Disposition: A | Discharge status: Home / Self Care | Payer: Medicare Other | Source: Ambulatory Visit | Attending: Cardiovascular Disease | Admitting: Cardiovascular Disease

## 2013-07-05 DIAGNOSIS — I2510 Atherosclerotic heart disease of native coronary artery without angina pectoris: Secondary | ICD-10-CM

## 2013-07-05 DIAGNOSIS — E785 Hyperlipidemia, unspecified: Secondary | ICD-10-CM

## 2013-07-05 DIAGNOSIS — I10 Essential (primary) hypertension: Secondary | ICD-10-CM

## 2013-07-05 DIAGNOSIS — I428 Other cardiomyopathies: Secondary | ICD-10-CM

## 2013-07-05 DIAGNOSIS — I429 Cardiomyopathy, unspecified: Secondary | ICD-10-CM

## 2013-07-05 LAB — BASIC METABOLIC PANEL
BUN: 16 mg/dL (ref 6–23)
CALCIUM: 9.9 mg/dL (ref 8.4–10.5)
CO2: 29 meq/L (ref 19–32)
Chloride: 103 mEq/L (ref 96–112)
Creatinine, Ser: 0.9 mg/dL (ref 0.4–1.2)
GFR: 82.76 mL/min (ref >60.00)
Glucose, Bld: 81 mg/dL (ref 70–99)
Potassium: 4.1 mEq/L (ref 3.5–5.1)
Sodium: 137 mEq/L (ref 135–145)

## 2013-07-05 LAB — GLUCOSE, CAPILLARY
GLUCOSE-CAPILLARY: 180 mg/dL — AB (ref 70–99)
GLUCOSE-CAPILLARY: 114 mg/dL — AB (ref 70–99)
Glucose-Capillary: 118 mg/dL — ABNORMAL HIGH (ref 70–99)

## 2013-07-07 ENCOUNTER — Encounter (HOSPITAL_COMMUNITY)
Admission: RE | Admit: 2013-07-07 | Discharge: 2013-07-07 | Disposition: A | Discharge status: Home / Self Care | Payer: Medicare Other | Source: Ambulatory Visit | Attending: Cardiovascular Disease | Admitting: Cardiovascular Disease

## 2013-07-07 LAB — GLUCOSE, CAPILLARY: GLUCOSE-CAPILLARY: 116 mg/dL — AB (ref 70–99)

## 2013-07-10 ENCOUNTER — Encounter (HOSPITAL_COMMUNITY)
Admission: RE | Admit: 2013-07-10 | Discharge: 2013-07-10 | Disposition: A | Discharge status: Home / Self Care | Payer: Medicare Other | Source: Ambulatory Visit | Attending: Cardiovascular Disease | Admitting: Cardiovascular Disease

## 2013-07-10 LAB — GLUCOSE, CAPILLARY: Glucose-Capillary: 177 mg/dL — ABNORMAL HIGH (ref 70–99)

## 2013-07-10 NOTE — Progress Notes (Signed)
Pt completed 36 exercise sessions during her participation in cardiac rehab phase II. Pt has made good progress with exercise and excellent attendance to education classes. Pt struggled initially with diabetes management - this was greatly improved with MD intervention lower dose of insulin on exercise days.  Pt would benefit from continue MD support in regards to diabetes management.  Pt has long relationship with her endocrinologist.  Pt plans to continue exercise with silver sneakers.  Pt met her goals and will continue to work on weight loss.  Due to the change in insulin dose pt regain her weight back to where she was post surgery 06/2012.  Repeat PHQ2 - 0.  Medication list reconciled.

## 2013-07-12 ENCOUNTER — Encounter (HOSPITAL_COMMUNITY): Payer: Medicare Other

## 2013-07-14 ENCOUNTER — Encounter (HOSPITAL_COMMUNITY): Payer: Medicare Other

## 2013-07-18 ENCOUNTER — Ambulatory Visit: Payer: Medicare Other | Admitting: Internal Medicine

## 2013-07-19 ENCOUNTER — Encounter (HOSPITAL_COMMUNITY): Payer: Medicare Other

## 2013-07-21 ENCOUNTER — Encounter (HOSPITAL_COMMUNITY): Payer: Medicare Other

## 2013-07-24 ENCOUNTER — Encounter (HOSPITAL_COMMUNITY): Payer: Medicare Other

## 2013-07-26 ENCOUNTER — Encounter (HOSPITAL_COMMUNITY): Payer: Medicare Other

## 2013-07-28 ENCOUNTER — Encounter (HOSPITAL_COMMUNITY): Payer: Medicare Other

## 2013-07-31 ENCOUNTER — Encounter (HOSPITAL_COMMUNITY): Payer: Medicare Other

## 2013-08-02 ENCOUNTER — Encounter (HOSPITAL_COMMUNITY): Payer: Medicare Other

## 2013-08-02 ENCOUNTER — Encounter: Payer: Self-pay | Admitting: Internal Medicine

## 2013-08-02 ENCOUNTER — Ambulatory Visit (INDEPENDENT_AMBULATORY_CARE_PROVIDER_SITE_OTHER): Payer: Medicare Other | Admitting: Internal Medicine

## 2013-08-02 VITALS — BP 121/57 | HR 73 | Ht 63.0 in | Wt 202.0 lb

## 2013-08-02 DIAGNOSIS — I2589 Other forms of chronic ischemic heart disease: Secondary | ICD-10-CM

## 2013-08-02 DIAGNOSIS — I255 Ischemic cardiomyopathy: Secondary | ICD-10-CM

## 2013-08-02 NOTE — Progress Notes (Signed)
Patient Care Team: Junious Silk, MD as PCP - General (Endocrinology) Lewayne Bunting, MD as Attending Physician (Cardiology)   HPI  Ashlee [name] is a 73 y.o. female Seen for reconsideration of an ICD. Last spring her ejection fraction was 23%. Catheterization May 2014>> demonstrated three-vessel disease and she  underwent MRI suggesting viability.  Patient subsequently had coronary artery bypassing graft with a LIMA to the LAD, sequential saphenous vein graft to the diagonal and first marginal and a saphenous vein graft to the PDA AND POST REVASCULARIZATION EJECTION FRACTION WAS 25%.  Her beta blockers have been uptitrated and ace were initiated. This was subsequently changed to an ARB because of cough  The patient denies chest pain, shortness of breath, nocturnal dyspnea, orthopnea or peripheral edema.  There have been no palpitations, lightheadedness or syncope.       Past Medical History  Diagnosis Date  . CAD (coronary artery disease) 1998    stent post heart attack  . Hypertension   . Hyperlipidemia   . Myocardial infarction 1998  . PONV (postoperative nausea and vomiting)   . Shortness of breath     "occasionally; could happen at any time" (07/06/2012)  . DM type 2 (diabetes mellitus, type 2)   . Iron deficiency anemia   . Migraines     "ages 14 thru 57; associated w/menstral cycle" (07/06/2012)  . Arthritis     "in my knees" (07/06/2012)    Past Surgical History  Procedure Laterality Date  . Tubal ligation    . Tonsillectomy  1952  . Coronary angioplasty with stent placement  1998    LAD/notes 07/07/2012  . Cardiac catheterization  07/06/2012  . Shoulder hemi-arthroplasty Right ?2009    "fell and crushed it" (07/06/2012)  . Shoulder arthroscopy Right 07/25/2007    exam under anesthesia; arthroscopic biceps tenotomy; partial labral tear debridement; spur excision/notes 07/25/2007 (07/06/2012)  . Reduction mammaplasty    . Breast biopsy Right    "thought they saw something real tiny; it was a stitch left by OR when breast reduced" (07/06/2012)  . Coronary artery bypass graft N/A 07/11/2012    Procedure: CORONARY ARTERY BYPASS GRAFTING (CABG);  Surgeon: Loreli Slot, MD;  Location: [facility] OR;  Service: Open Heart Surgery;  Laterality: N/A;  x4, using left internal mammary and right greater saphenous vein.     Current Outpatient Prescriptions  Medication Sig Dispense Refill  . acetaminophen (TYLENOL) 325 MG tablet Take 325 mg by mouth as needed for mild pain or moderate pain.      Marland Kitchen aspirin 81 MG tablet Take 81 mg by mouth daily.      . carvedilol (COREG) 6.25 MG tablet Take 1 tablet (6.25 mg total) by mouth 2 (two) times daily.  60 tablet  12  . CRESTOR 10 MG tablet Take 1 tablet by mouth daily.      . ergocalciferol (VITAMIN D2) 50000 UNITS capsule Take 50,000 Units by mouth once a week. Take it every wednesday      . exenatide (BYETTA 10 MCG PEN) 10 MCG/0.04ML SOLN Inject 10 mcg into the skin 2 (two) times daily with a meal. bid      . Ferrous Sulfate (IRON) 325 (65 FE) MG TABS Take 1 tablet by mouth daily.      Marland Kitchen glimepiride (AMARYL) 2 MG tablet Take 2 mg by mouth daily before breakfast.      . glucose blood (SMARTEST TEST) test strip 1 each by  Other route 2 (two) times daily. Use as instructed      . insulin NPH (HUMULIN N,NOVOLIN N) 100 UNIT/ML injection Inject 5 Units into the skin at bedtime.       Marland Kitchen. losartan (COZAAR) 50 MG tablet Take 1 tablet (50 mg total) by mouth daily.  90 tablet  3  . metFORMIN (GLUCOPHAGE) 1000 MG tablet Take 500 mg by mouth 2 (two) times daily with a meal. Per pt Dr. Emeline GeneralAlthemier instructed to decrease dosage to 500 mg twice a day      . Multiple Vitamins-Minerals (CENTRUM SILVER ADULT 50+ PO) Take 1 tablet by mouth daily.      . pioglitazone (ACTOS) 30 MG tablet Take 30 mg by mouth daily.        Marland Kitchen. spironolactone (ALDACTONE) 25 MG tablet Take 1 tablet (25 mg total) by mouth daily.  90 tablet  3  . vitamin  B-12 (CYANOCOBALAMIN) 1000 MCG tablet Take 1,000 mcg by mouth daily.       No current facility-administered medications for this visit.    Allergies  Allergen Reactions  . Demerol Nausea And Vomiting    Perfuse vomitting  . Percodan [Oxycodone-Aspirin] Nausea And Vomiting    Perfuse vomitting    Review of Systems negative except from HPI and PMH  Physical Exam BP 121/57  Pulse 73  Ht 5\' 3"  (1.6 m)  Wt 202 lb (91.627 kg)  BMI 35.79 kg/m2 Well developed and well nourished in no acute distress HENT normal E scleral and icterus clear Neck Supple JVP flat; carotids brisk and full Clear to ausculation  Regular rate and rhythm, 2/6 systolic murmur Soft with active bowel sounds No clubbing cyanosis  Edema Alert and oriented, grossly normal motor and sensory function Skin Warm and Dry  NSR  22 10 39 LVH   Assessment and  Plan  Ischemic cardiomyopathy  Congestive heart failure-chronic-systolic class II  She is doing well clinically. Prior to making a decision regarding ICD, we'll need to undertake an ultrasound to assess left ventricular function. We discussed the decision-making process

## 2013-08-02 NOTE — Patient Instructions (Addendum)
Your physician recommends that you continue on your current medications as directed. Please refer to the Current Medication list given to you today.  Your physician has requested that you have an echocardiogram. Echocardiography is a painless test that uses sound waves to create images of your heart. It provides your doctor with information about the size and shape of your heart and how well your heart's chambers and valves are working. This procedure takes approximately one hour. There are no restrictions for this procedure.  Follow up to be determined after echo test.

## 2013-08-04 ENCOUNTER — Encounter (HOSPITAL_COMMUNITY): Payer: Medicare Other

## 2013-08-11 ENCOUNTER — Ambulatory Visit (HOSPITAL_COMMUNITY)
Admission: RE | Admit: 2013-08-11 | Discharge: 2013-08-11 | Disposition: A | Discharge status: Home / Self Care | Payer: Medicare Other | Source: Ambulatory Visit | Attending: Cardiovascular Disease | Admitting: Cardiovascular Disease

## 2013-08-11 DIAGNOSIS — I369 Nonrheumatic tricuspid valve disorder, unspecified: Secondary | ICD-10-CM

## 2013-08-11 DIAGNOSIS — I255 Ischemic cardiomyopathy: Secondary | ICD-10-CM

## 2013-08-11 NOTE — Progress Notes (Deleted)
2D Echocardiogram Complete.  08/11/2013   Bethany McMahill, RDCS  Definity Contrast was administered for opacification of Left Ventricle.

## 2013-08-11 NOTE — Progress Notes (Addendum)
2D Echocardiogram Complete.  08/11/2013   Bethany McMahill, RDCS  Definity administered for opacification of Left Ventricular Apex.

## 2013-08-18 ENCOUNTER — Telehealth: Payer: Self-pay | Admitting: *Deleted

## 2013-08-18 NOTE — Telephone Encounter (Signed)
pt notified about echo and EF 30%. Pt states she would like to proceed with Defib implant. I said I will let Dr. Odessa Fleming RN Sherri cb either  Monday or Tuesday to schedule for procedure. Pt said ok and thank you.

## 2013-09-06 ENCOUNTER — Telehealth: Payer: Self-pay | Admitting: *Deleted

## 2013-09-06 NOTE — Telephone Encounter (Signed)
Left message with pt's husband, explaining that I would call back Friday to schedule procedure. He will pass along message.

## 2013-09-08 NOTE — Telephone Encounter (Signed)
Lm to discuss procedure.

## 2013-09-11 ENCOUNTER — Other Ambulatory Visit: Payer: Self-pay | Admitting: *Deleted

## 2013-09-11 ENCOUNTER — Encounter: Payer: Self-pay | Admitting: *Deleted

## 2013-09-11 DIAGNOSIS — Z01812 Encounter for preprocedural laboratory examination: Secondary | ICD-10-CM

## 2013-09-11 DIAGNOSIS — I255 Ischemic cardiomyopathy: Secondary | ICD-10-CM

## 2013-09-11 NOTE — Telephone Encounter (Signed)
Discussed upcoming ICD procedure and instructions. Letter of instructions reviewed and left at front desk for pick up. Pre procedure labs 7/30. Wound check 8/19. Patient verbalized understanding and agreeable to plan.

## 2013-09-15 ENCOUNTER — Encounter (HOSPITAL_COMMUNITY): Payer: Self-pay | Admitting: Pharmacy Technician

## 2013-09-19 NOTE — Telephone Encounter (Signed)
Informed patient ICD time pushed back on 8/6. She is to be at the hospital at 1:30 p.m. day of procedure. She verbalized understanding.

## 2013-09-21 ENCOUNTER — Other Ambulatory Visit (INDEPENDENT_AMBULATORY_CARE_PROVIDER_SITE_OTHER): Payer: Medicare Other

## 2013-09-21 DIAGNOSIS — I2589 Other forms of chronic ischemic heart disease: Secondary | ICD-10-CM

## 2013-09-21 DIAGNOSIS — Z01812 Encounter for preprocedural laboratory examination: Secondary | ICD-10-CM

## 2013-09-21 DIAGNOSIS — I255 Ischemic cardiomyopathy: Secondary | ICD-10-CM

## 2013-09-21 LAB — CBC WITH DIFFERENTIAL/PLATELET
Basophils Absolute: 0 10*3/uL (ref 0.0–0.1)
Basophils Relative: 0.5 % (ref 0.0–3.0)
Eosinophils Absolute: 0.1 10*3/uL (ref 0.0–0.7)
Eosinophils Relative: 2 % (ref 0.0–5.0)
HEMATOCRIT: 32.6 % — AB (ref 36.0–46.0)
Hemoglobin: 10.5 g/dL — ABNORMAL LOW (ref 12.0–15.0)
LYMPHS ABS: 1.6 10*3/uL (ref 0.7–4.0)
LYMPHS PCT: 35.3 % (ref 12.0–46.0)
MCHC: 32.3 g/dL (ref 30.0–36.0)
MCV: 86.1 fl (ref 78.0–100.0)
MONOS PCT: 13.3 % — AB (ref 3.0–12.0)
Monocytes Absolute: 0.6 10*3/uL (ref 0.1–1.0)
NEUTROS PCT: 48.9 % (ref 43.0–77.0)
Neutro Abs: 2.2 10*3/uL (ref 1.4–7.7)
PLATELETS: 219 10*3/uL (ref 150.0–400.0)
RBC: 3.79 Mil/uL — ABNORMAL LOW (ref 3.87–5.11)
RDW: 15.4 % (ref 11.5–15.5)
WBC: 4.4 10*3/uL (ref 4.0–10.5)

## 2013-09-21 LAB — BASIC METABOLIC PANEL
BUN: 19 mg/dL (ref 6–23)
CO2: 27 mEq/L (ref 19–32)
Calcium: 9.1 mg/dL (ref 8.4–10.5)
Chloride: 105 mEq/L (ref 96–112)
Creatinine, Ser: 1 mg/dL (ref 0.4–1.2)
GFR: 67.17 mL/min (ref >60.00)
Glucose, Bld: 90 mg/dL (ref 70–99)
POTASSIUM: 4.1 meq/L (ref 3.5–5.1)
Sodium: 138 mEq/L (ref 135–145)

## 2013-09-25 NOTE — Telephone Encounter (Signed)
Patient's husband called to hospital wanting further instructions for procedure Thursday. Informed him to arrive at 1:30 at main entrance. He also states that letter of instructions were not received - I explained that we left it at front desk for her pick up when she came in for her pre procedure labs. I will mail letter so she has wound check appt time.  She will call me tomorrow to review instructions again.

## 2013-09-25 NOTE — Telephone Encounter (Signed)
Patient calls me back. Reviewed time to arrive at hospital. Pt to stop by tomorrow to pick up letter of instructions.

## 2013-09-27 DIAGNOSIS — I2589 Other forms of chronic ischemic heart disease: Secondary | ICD-10-CM | POA: Diagnosis present

## 2013-09-27 DIAGNOSIS — D509 Iron deficiency anemia, unspecified: Secondary | ICD-10-CM | POA: Diagnosis not present

## 2013-09-27 DIAGNOSIS — I10 Essential (primary) hypertension: Secondary | ICD-10-CM | POA: Diagnosis not present

## 2013-09-27 DIAGNOSIS — I252 Old myocardial infarction: Secondary | ICD-10-CM | POA: Diagnosis not present

## 2013-09-27 DIAGNOSIS — E119 Type 2 diabetes mellitus without complications: Secondary | ICD-10-CM | POA: Diagnosis not present

## 2013-09-27 DIAGNOSIS — Z9861 Coronary angioplasty status: Secondary | ICD-10-CM | POA: Diagnosis not present

## 2013-09-27 DIAGNOSIS — I509 Heart failure, unspecified: Secondary | ICD-10-CM | POA: Diagnosis not present

## 2013-09-27 DIAGNOSIS — I2510 Atherosclerotic heart disease of native coronary artery without angina pectoris: Secondary | ICD-10-CM | POA: Diagnosis not present

## 2013-09-27 DIAGNOSIS — E785 Hyperlipidemia, unspecified: Secondary | ICD-10-CM | POA: Diagnosis not present

## 2013-09-27 DIAGNOSIS — Z951 Presence of aortocoronary bypass graft: Secondary | ICD-10-CM | POA: Diagnosis not present

## 2013-09-27 MED ORDER — MUPIROCIN 2 % EX OINT
1.0000 "application " | TOPICAL_OINTMENT | Freq: Once | CUTANEOUS | Status: AC
  Administered 2013-09-28: 1 via TOPICAL
  Filled 2013-09-27: qty 22

## 2013-09-27 MED ORDER — CEFAZOLIN SODIUM-DEXTROSE 2-3 GM-% IV SOLR: 2.0000 g | INTRAVENOUS | Status: DC

## 2013-09-27 MED ORDER — SODIUM CHLORIDE 0.9 % IV SOLN
INTRAVENOUS | Status: DC
  Administered 2013-09-28: 13:00:00 via INTRAVENOUS

## 2013-09-27 MED ORDER — SODIUM CHLORIDE 0.9 % IR SOLN
80.0000 mg | Status: DC
  Filled 2013-09-27 (×2): qty 2

## 2013-09-27 MED ORDER — CHLORHEXIDINE GLUCONATE 4 % EX LIQD
60.0000 mL | Freq: Once | CUTANEOUS | Status: DC
  Filled 2013-09-27: qty 60

## 2013-09-28 ENCOUNTER — Ambulatory Visit (HOSPITAL_COMMUNITY)
Admission: RE | Admit: 2013-09-28 | Discharge: 2013-09-29 | Disposition: A | Discharge status: Home / Self Care | Payer: Medicare Other | Source: Ambulatory Visit | Attending: Internal Medicine | Admitting: Internal Medicine

## 2013-09-28 ENCOUNTER — Encounter (HOSPITAL_COMMUNITY)
Admission: RE | Disposition: A | Discharge status: Home / Self Care | Payer: Self-pay | Source: Ambulatory Visit | Attending: Internal Medicine

## 2013-09-28 DIAGNOSIS — I255 Ischemic cardiomyopathy: Secondary | ICD-10-CM | POA: Diagnosis present

## 2013-09-28 DIAGNOSIS — D509 Iron deficiency anemia, unspecified: Secondary | ICD-10-CM | POA: Insufficient documentation

## 2013-09-28 DIAGNOSIS — Z9861 Coronary angioplasty status: Secondary | ICD-10-CM | POA: Insufficient documentation

## 2013-09-28 DIAGNOSIS — E785 Hyperlipidemia, unspecified: Secondary | ICD-10-CM

## 2013-09-28 DIAGNOSIS — I509 Heart failure, unspecified: Secondary | ICD-10-CM | POA: Insufficient documentation

## 2013-09-28 DIAGNOSIS — I2589 Other forms of chronic ischemic heart disease: Secondary | ICD-10-CM | POA: Diagnosis not present

## 2013-09-28 DIAGNOSIS — I10 Essential (primary) hypertension: Secondary | ICD-10-CM | POA: Insufficient documentation

## 2013-09-28 DIAGNOSIS — I2510 Atherosclerotic heart disease of native coronary artery without angina pectoris: Secondary | ICD-10-CM | POA: Insufficient documentation

## 2013-09-28 DIAGNOSIS — Z951 Presence of aortocoronary bypass graft: Secondary | ICD-10-CM | POA: Insufficient documentation

## 2013-09-28 DIAGNOSIS — I252 Old myocardial infarction: Secondary | ICD-10-CM | POA: Insufficient documentation

## 2013-09-28 DIAGNOSIS — E119 Type 2 diabetes mellitus without complications: Secondary | ICD-10-CM | POA: Insufficient documentation

## 2013-09-28 HISTORY — PX: IMPLANTABLE CARDIOVERTER DEFIBRILLATOR IMPLANT: SHX5473

## 2013-09-28 LAB — GLUCOSE, CAPILLARY
Glucose-Capillary: 77 mg/dL (ref 70–99)
Glucose-Capillary: 108 mg/dL — ABNORMAL HIGH (ref 70–99)
Glucose-Capillary: 190 mg/dL — ABNORMAL HIGH (ref 70–99)

## 2013-09-28 LAB — SURGICAL PCR SCREEN
MRSA, PCR: NEGATIVE
STAPHYLOCOCCUS AUREUS: NEGATIVE

## 2013-09-28 SURGERY — IMPLANTABLE CARDIOVERTER DEFIBRILLATOR IMPLANT
Anesthesia: LOCAL

## 2013-09-28 MED ORDER — VITAMIN B-12 1000 MCG PO TABS
1000.0000 ug | ORAL_TABLET | Freq: Every day | ORAL | Status: DC
  Administered 2013-09-29: 1000 ug via ORAL
  Filled 2013-09-28: qty 1

## 2013-09-28 MED ORDER — MUPIROCIN 2 % EX OINT
TOPICAL_OINTMENT | CUTANEOUS | Status: AC
  Administered 2013-09-28: 1 via TOPICAL
  Filled 2013-09-28: qty 22

## 2013-09-28 MED ORDER — CARVEDILOL 6.25 MG PO TABS
6.2500 mg | ORAL_TABLET | Freq: Two times a day (BID) | ORAL | Status: DC
  Administered 2013-09-28 – 2013-09-29 (×2): 6.25 mg via ORAL
  Filled 2013-09-28 (×3): qty 1

## 2013-09-28 MED ORDER — METFORMIN HCL 500 MG PO TABS
500.0000 mg | ORAL_TABLET | Freq: Two times a day (BID) | ORAL | Status: DC
  Administered 2013-09-28 – 2013-09-29 (×2): 500 mg via ORAL
  Filled 2013-09-28 (×4): qty 1

## 2013-09-28 MED ORDER — ACETAMINOPHEN 325 MG PO TABS: 325.0000 mg | ORAL_TABLET | ORAL | Status: DC | PRN

## 2013-09-28 MED ORDER — SODIUM CHLORIDE 0.9 % IV SOLN: INTRAVENOUS | Status: DC

## 2013-09-28 MED ORDER — INSULIN NPH (HUMAN) (ISOPHANE) 100 UNIT/ML ~~LOC~~ SUSP
5.0000 [IU] | Freq: Every day | SUBCUTANEOUS | Status: DC
  Administered 2013-09-28: 5 [IU] via SUBCUTANEOUS
  Filled 2013-09-28: qty 10

## 2013-09-28 MED ORDER — LIDOCAINE HCL (PF) 1 % IJ SOLN
INTRAMUSCULAR | Status: AC
  Filled 2013-09-28: qty 60

## 2013-09-28 MED ORDER — LOSARTAN POTASSIUM 50 MG PO TABS
50.0000 mg | ORAL_TABLET | Freq: Every day | ORAL | Status: DC
  Administered 2013-09-29: 10:00:00 50 mg via ORAL
  Filled 2013-09-28: qty 1

## 2013-09-28 MED ORDER — GLIMEPIRIDE 2 MG PO TABS
2.0000 mg | ORAL_TABLET | Freq: Every day | ORAL | Status: DC
  Administered 2013-09-29: 10:00:00 2 mg via ORAL
  Filled 2013-09-28 (×2): qty 1

## 2013-09-28 MED ORDER — PIOGLITAZONE HCL 30 MG PO TABS
30.0000 mg | ORAL_TABLET | Freq: Every day | ORAL | Status: DC
  Administered 2013-09-29: 30 mg via ORAL
  Filled 2013-09-28: qty 1

## 2013-09-28 MED ORDER — CEFAZOLIN SODIUM-DEXTROSE 2-3 GM-% IV SOLR
INTRAVENOUS | Status: AC
  Filled 2013-09-28: qty 50

## 2013-09-28 MED ORDER — LIDOCAINE HCL (PF) 1 % IJ SOLN
INTRAMUSCULAR | Status: AC
  Filled 2013-09-28: qty 30

## 2013-09-28 MED ORDER — ASPIRIN 81 MG PO TABS: 81.0000 mg | ORAL_TABLET | Freq: Every day | ORAL | Status: DC

## 2013-09-28 MED ORDER — FENTANYL CITRATE 0.05 MG/ML IJ SOLN
INTRAMUSCULAR | Status: AC
  Filled 2013-09-28: qty 2

## 2013-09-28 MED ORDER — ASPIRIN EC 81 MG PO TBEC
81.0000 mg | DELAYED_RELEASE_TABLET | Freq: Every day | ORAL | Status: DC
  Administered 2013-09-29: 10:00:00 81 mg via ORAL
  Filled 2013-09-28 (×2): qty 1

## 2013-09-28 MED ORDER — CEFAZOLIN SODIUM 1-5 GM-% IV SOLN
1.0000 g | Freq: Four times a day (QID) | INTRAVENOUS | Status: AC
  Administered 2013-09-28 – 2013-09-29 (×3): 1 g via INTRAVENOUS
  Filled 2013-09-28 (×3): qty 50

## 2013-09-28 MED ORDER — EXENATIDE 10 MCG/0.04ML ~~LOC~~ SOPN: 10.0000 ug | PEN_INJECTOR | Freq: Two times a day (BID) | SUBCUTANEOUS | Status: DC

## 2013-09-28 MED ORDER — SPIRONOLACTONE 25 MG PO TABS
25.0000 mg | ORAL_TABLET | Freq: Every day | ORAL | Status: DC
  Administered 2013-09-29: 25 mg via ORAL
  Filled 2013-09-28: qty 1

## 2013-09-28 MED ORDER — HEPARIN (PORCINE) IN NACL 2-0.9 UNIT/ML-% IJ SOLN
INTRAMUSCULAR | Status: AC
  Filled 2013-09-28: qty 500

## 2013-09-28 MED ORDER — ONDANSETRON HCL 4 MG/2ML IJ SOLN
4.0000 mg | Freq: Four times a day (QID) | INTRAMUSCULAR | Status: DC | PRN
Start: 2013-09-28 — End: 2013-09-29

## 2013-09-28 MED ORDER — MIDAZOLAM HCL 5 MG/5ML IJ SOLN
INTRAMUSCULAR | Status: AC
  Filled 2013-09-28: qty 5

## 2013-09-28 MED ORDER — DEXTROSE-NACL 5-0.45 % IV SOLN
INTRAVENOUS | Status: AC
  Administered 2013-09-28: 50 mL/h via INTRAVENOUS

## 2013-09-28 NOTE — Interval H&P Note (Signed)
ICD Criteria  Current LVEF:30% ;Obtained > or = 1 month ago and < or = 3 months ago.  NYHA Functional Classification: Class I  Heart Failure History:  No.  Non-Ischemic Dilated Cardiomyopathy History:  No.  Atrial Fibrillation/Atrial Flutter:  No.  Ventricular Tachycardia History:  No.  Cardiac Arrest History:  No  History of Syndromes with Risk of Sudden Death:  No.  Previous ICD:  No.  Electrophysiology Study: No.  Prior MI: Yes, Most recent MI timeframe is > 40 days.  PPM: No.  OSA:  No  Patient Life Expectancy of >=1 year: Yes.  Anticoagulation Therapy:  Patient is NOT on anticoagulation therapy.   Beta Blocker Therapy:  Yes.   Ace Inhibitor/ARB Therapy:  Yes.History and Physical Interval Note:  09/28/2013 12:26 PM  Ashlee [name]  has presented today for surgery, with the diagnosis of ischemic cardiomyopathy  The various methods of treatment have been discussed with the patient and family. After consideration of risks, benefits and other options for treatment, the patient has consented to  Procedure(s): IMPLANTABLE CARDIOVERTER DEFIBRILLATOR IMPLANT (N/A) as a surgical intervention .  The patient's history has been reviewed, patient examined, no change in status, stable for surgery.  I have reviewed the patient's chart and labs.  Questions were answered to the patient's satisfaction.     Sherryl Manges

## 2013-09-28 NOTE — H&P (Signed)
Patient Care Team: Junious SilkMichael D Altheimer, MD as PCP - General (Endocrinology) Lewayne BuntingBrian S Crenshaw, MD as Attending Physician (Cardiology)   HPI  Ashlee [name] is a 73 y.o. female Seen for implantation  of an ICD.   Last spring her ejection fraction was 23%. Catheterization May 2014>> demonstrated three-vessel disease and she underwent MRI suggesting viability.  Patient subsequently had coronary artery bypassing graft with a LIMA to the LAD, sequential saphenous vein graft to the diagonal and first marginal and a saphenous vein graft to the PDA AND POST REVASCULARIZATION EJECTION FRACTION WAS 25%.  Her beta blockers have been uptitrated and ace were initiated. This was subsequently changed to an ARB because of cough   Repeat echo showed EF 30%  The patient denies chest pain, shortness of breath, nocturnal dyspnea, orthopnea or peripheral edema. There have been no palpitations, lightheadedness or syncope.    Past Medical History  Diagnosis Date  . CAD (coronary artery disease) 1998    stent post heart attack  . Hypertension   . Hyperlipidemia   . Myocardial infarction 1998  . PONV (postoperative nausea and vomiting)   . Shortness of breath     "occasionally; could happen at any time" (07/06/2012)  . DM type 2 (diabetes mellitus, type 2)   . Iron deficiency anemia   . Migraines     "ages [age] thru [age]; associated w/menstral cycle" (07/06/2012)  . Arthritis     "in my knees" (07/06/2012)    Past Surgical History  Procedure Laterality Date  . Tubal ligation    . Tonsillectomy  1952  . Coronary angioplasty with stent placement  1998    LAD/notes 07/07/2012  . Cardiac catheterization  07/06/2012  . Shoulder hemi-arthroplasty Right ?2009    "fell and crushed it" (07/06/2012)  . Shoulder arthroscopy Right 07/25/2007    exam under anesthesia; arthroscopic biceps tenotomy; partial labral tear debridement; spur excision/notes 07/25/2007 (07/06/2012)  . Reduction mammaplasty    . Breast  biopsy Right     "thought they saw something real tiny; it was a stitch left by OR when breast reduced" (07/06/2012)  . Coronary artery bypass graft N/A 07/11/2012    Procedure: CORONARY ARTERY BYPASS GRAFTING (CABG);  Surgeon: Loreli SlotSteven C Hendrickson, MD;  [facility];  Service: Open Heart Surgery;  Laterality: N/A;  x4, using left internal mammary and right greater saphenous vein.     Current Facility-Administered Medications  Medication Dose Route Frequency Provider Last Rate Last Dose  . 0.9 %  sodium chloride infusion   Intravenous Continuous Duke SalviaSteven C Klein, MD      . ceFAZolin (ANCEF) 2-3 GM-% IVPB SOLR           . ceFAZolin (ANCEF) IVPB 2 g/50 mL premix  2 g Intravenous On Call Duke SalviaSteven C Klein, MD      . chlorhexidine (HIBICLENS) 4 % liquid 4 application  60 mL Topical Once Duke SalviaSteven C Klein, MD      . gentamicin (GARAMYCIN) 80 mg in sodium chloride irrigation 0.9 % 500 mL irrigation  80 mg Irrigation On Call Duke SalviaSteven C Klein, MD      . mupirocin ointment (BACTROBAN) 2 % 1 application  1 application Topical Once Duke SalviaSteven C Klein, MD      . mupirocin ointment (BACTROBAN) 2 %             Allergies  Allergen Reactions  . Demerol Nausea And Vomiting    Perfuse vomitting  . Percodan [  Oxycodone-Aspirin] Nausea And Vomiting    Perfuse vomitting    Review of Systems negative except from HPI and PMH  Physical Exam BP 155/63  Pulse 60  Temp(Src) 97.8 F (36.6 C) (Oral)  Resp 18  Ht 5\' 3"  (1.6 m)  Wt 204 lb (92.534 kg)  BMI 36.15 kg/m2  SpO2 100% Well developed and well nourished in no acute distress HENT normal E scleral and icterus clear Neck Supple JVP flat; carotids brisk and full Clear to ausculation  Regular rate and rhythm, no murmurs gallops or rub Soft with active bowel sounds No clubbing cyanosis  Edema Alert and oriented, grossly normal motor and sensory function Skin Warm and Dry   ecg  NSR 73 22/11/43  Assessment and  Plan  Ischemic cardiomyopathy  CHF  Class  1  Pt for ICD implantation for primary prevention   Have reviewed the potential benefits and risks of ICD implantation including but not limited to death, perforation of heart or lung, lead dislodgement, infection,  device malfunction and inappropriate shocks.  The patient and family express understanding  and are willing to proceed.

## 2013-09-28 NOTE — CV Procedure (Signed)
[name] [id number]  [id number]  Preop EH:[id number] CARDIOMYOPATHY Postop Dx same/   Procedure: single chamber ICD implantation with out intraoperative defibrillation threshold testing  Following the obtaining of informed consent the patient was brought to the electrophysiology laboratory in place of the fluoroscopic table in the supine position.  After routine prep and drape, lidocaine was infiltrated in the prepectoral subclavicular region and an incision was made and carried down to the layer of the prepectoral fascia using electrocautery and sharp dissection. A pocket was formed similarly.  Thereafter an attention was turned to gain access to the extrathoracic left subclavian vein which was accomplished without difficulty and without the aspiration of air or puncture of the artery. A 9 French sheath was placed for which was then passed a  Single coil  active fixation defibrillator lead, model Medtronic 6035 serial number [id number]. It was  passed under fluoroscopic guidance to the right ventricular apex. In its location the bipolar R wave was 17 millivolts, impedance was 735 ohms, the pacing threshold was 0.8 volts at 0.5 msec. Current at threshold was 1.0 mA.  There was no diaphragmatic pacing at 10 V. The current of injury was brisk.  The lead was secured to the prepectoral fascia and then attached to a Medtronic  ICD, serial number  [id number].  Through the device, the bipolar R wave was 1608 millivolts, impedance was 475 ohms, the pacing threshold was 1.0 volts at 0.4 msec. High-voltage impedance of 53 ohms.  The pocket was copiously irrigated with antibiotic containing saline solution. Hemostasis was assured, and the device and the lead was placed in the pocket and secured to the prepectoral fascia.  The wound is closed in 3 layers in normal fashion. The wound is washed dried and a benzoin Steri-Strips dressing was then applied. Needle counts sponge counts and instrument counts were  correct at the end of the procedure according to the staff.   Patient tolerated the procedure without apparent complication      Cx: None     Ashlee Manges, MD 09/28/2013 5:06 PM

## 2013-09-29 ENCOUNTER — Ambulatory Visit (HOSPITAL_COMMUNITY): Payer: Medicare Other

## 2013-09-29 DIAGNOSIS — I2510 Atherosclerotic heart disease of native coronary artery without angina pectoris: Secondary | ICD-10-CM | POA: Diagnosis not present

## 2013-09-29 DIAGNOSIS — I10 Essential (primary) hypertension: Secondary | ICD-10-CM | POA: Diagnosis not present

## 2013-09-29 DIAGNOSIS — I2589 Other forms of chronic ischemic heart disease: Secondary | ICD-10-CM

## 2013-09-29 DIAGNOSIS — I509 Heart failure, unspecified: Secondary | ICD-10-CM | POA: Diagnosis not present

## 2013-09-29 LAB — GLUCOSE, CAPILLARY: Glucose-Capillary: 88 mg/dL (ref 70–99)

## 2013-09-29 MED ORDER — YOU HAVE A PACEMAKER BOOK
Freq: Once | Status: AC
Start: 2013-09-29 — End: 2013-09-29
  Administered 2013-09-29: 01:00:00
  Filled 2013-09-29: qty 1

## 2013-09-29 MED ORDER — LIVING WELL WITH DIABETES BOOK
Freq: Once | Status: AC
  Administered 2013-09-29: 01:00:00
  Filled 2013-09-29: qty 1

## 2013-09-29 NOTE — Discharge Instructions (Signed)
° °  Supplemental Discharge Instructions for  Pacemaker/Defibrillator Patients  Activity No heavy lifting or vigorous activity with your left/right arm for 6 to 8 weeks.  Do not raise your left/right arm above your head for one week.  Gradually raise your affected arm as drawn below.                       08/9                   08/10                    08/11                    08/12            NO DRIVING for  one week    ; you may begin driving on     24/26     . WOUND CARE   Keep the wound area clean and dry.  Do not get this area wet for one week. No showers for one week; you may shower on      08/13        .   The tape/steri-strips on your wound will fall off; do not pull them off.  No bandage is needed on the site.  DO  NOT apply any creams, oils, or ointments to the wound area.   If you notice any drainage or discharge from the wound, any swelling or bruising at the site, or you develop a fever > 101? F after you are discharged home, call the office at once.  Special Instructions   You are still able to use cellular telephones; use the ear opposite the side where you have your pacemaker/defibrillator.  Avoid carrying your cellular phone near your device.   When traveling through airports, show security personnel your identification card to avoid being screened in the metal detectors.  Ask the security personnel to use the hand wand.   Avoid arc welding equipment, MRI testing (magnetic resonance imaging), TENS units (transcutaneous nerve stimulators).  Call the office for questions about other devices.   Avoid electrical appliances that are in poor condition or are not properly grounded.   Microwave ovens are safe to be near or to operate.  Additional information for defibrillator patients should your device go off:   If your device goes off ONCE and you feel fine afterward, notify the device clinic nurses.   If your device goes off ONCE and you do not feel well afterward, call 911.   If  your device goes off TWICE, call 911.   If your device goes off THREE times in one day, call 911.  DO NOT DRIVE YOURSELF OR A FAMILY MEMBER WITH A DEFIBRILLATOR TO THE HOSPITAL--CALL 911.

## 2013-09-29 NOTE — Care Management Note (Addendum)
  Page 1 of 1   09/29/2013     9:40:29 AM CARE MANAGEMENT NOTE 09/29/2013  Patient:  Ashlee [name], Ashlee [name]   Account Number:  [id number]  Date Initiated:  09/29/2013  Documentation initiated by:  Donato Schultz  Subjective/Objective Assessment:   ischemic cardiomyopathy     Action/Plan:   CM to follow for disposition needs   Anticipated DC Date:  09/29/2013   Anticipated DC Plan:  HOME/SELF CARE         Choice offered to / List presented to:             Status of service:  Completed, signed off Medicare Important Message given?   (If response is "NO", the following Medicare IM given date fields will be blank) Date Medicare IM given:   Medicare IM given by:   Date Additional Medicare IM given:   Additional Medicare IM given by:    Discharge Disposition:  HOME/SELF CARE  Per UR Regulation:    If discussed at Long Length of Stay Meetings, dates discussed:    Comments:  Taj Arteaga RN, BSN, MSHL, CCM  Nurse - Case Manager, (Unit [phone]  09/29/2013 Med Review:  No needs. Disposition Plan:  HOme / self care.

## 2013-09-29 NOTE — Discharge Summary (Signed)
ELECTROPHYSIOLOGY PROCEDURE DISCHARGE SUMMARY    Patient ID: Ashlee [name],  MRN: [id number], DOB/AGE: 75/05/1940 75 y.o.  Admit date: 09/28/2013 Discharge date: 09/29/2013  Primary Care Physician: Junious SilkALTHEIMER,MICHAEL D, MD Primary Cardiologist: Jens Somrenshaw Electrophysiologist: Graciela HusbandsKlein  Primary Discharge Diagnosis:  Ischemic cardiomyopathy status post ICD implantation this admission  Secondary Discharge Diagnosis:  1.  CAD s/p CABG 2.  Hypertension 3.  Hyperlipidemia 4.  Diabetes 5.  Iron deficiency anemia  Allergies  Allergen Reactions  . Demerol Nausea And Vomiting    Perfuse vomitting  . Percodan [Oxycodone-Aspirin] Nausea And Vomiting    Perfuse vomitting     Procedures This Admission:  1.  Implantation of a MDT single chamber ICD on 09-28-2013 by Dr Graciela HusbandsKlein.  See op note for full details.  DFT's were deferred at time of implant.  There were no immediate post procedure complications. 2.  CXR on 09-29-13 demonstrated no pneumothorax status post device implantation.   Brief HPI: Ashlee [name] is a 73 y.o. female was referred to electrophysiology in the outpatient setting for consideration of ICD implantation.  Past medical history includes CAD s/p CABG and ischemic cardiomyopathy.  The patient has persistent LV dysfunction despite guideline directed therapy.  Risks, benefits, and alternatives to ICD implantation were reviewed with the patient who wished to proceed.   Hospital Course:  The patient was admitted and underwent implantation of a MDT single chamber ICD with details as outlined above.   She was monitored on telemetry overnight which demonstrated sinus rhythm.  Left chest was without hematoma or ecchymosis.  The device was interrogated and found to be functioning normally.  CXR was obtained and demonstrated no pneumothorax status post device implantation.  Wound care, arm mobility, and restrictions were reviewed with the patient.  Dr Graciela HusbandsKlein examined the patient and  considered them stable for discharge to home.  Denies chest pain or shortness of breath  The patient's discharge medications include an ARB (Losartan) and beta blocker (Carvedilol).   Discharge Vitals: Blood pressure 125/102, pulse 60, temperature 97.5 F (36.4 C), temperature source Oral, resp. rate 18, height 5\' 3"  (1.6 m), weight 206 lb 12.7 oz (93.8 kg), SpO2 100.00%.  Well developed and nourished in no acute distress HENT normal Neck supple w \\ Clear Pocket without hematoma Regular rate and rhythm, no murmurs or gallops Abd-soft  No Clubbing cyanosis edema Skin-warm and dry A & Oriented  Grossly normal sensory and motor function   Labs:   Lab Results  Component Value Date   WBC 4.4 09/21/2013   HGB 10.5* 09/21/2013   HCT 32.6* 09/21/2013   MCV 86.1 09/21/2013   PLT 219.0 09/21/2013   No results found for this basename: NA, K, CL, CO2, BUN, CREATININE, CALCIUM, LABALBU, PROT, BILITOT, ALKPHOS, ALT, AST, GLUCOSE,  in the last 168 hours No results found for this basename: CKTOTAL, CKMB, CKMBINDEX, TROPONINI     Discharge Medications:    Medication List    ASK your doctor about these medications       aspirin 81 MG tablet  Take 81 mg by mouth daily.     BYETTA 10 MCG PEN 10 MCG/0.04ML Sopn injection  Generic drug:  exenatide  Inject 10 mcg into the skin 2 (two) times daily with a meal. bid     carvedilol 6.25 MG tablet  Commonly known as:  COREG  Take 1 tablet (6.25 mg total) by mouth 2 (two) times daily.     CENTRUM SILVER ADULT 50+ PO  Take 1 tablet by mouth daily.     CRESTOR 10 MG tablet  Generic drug:  rosuvastatin  Take 1 tablet by mouth daily.     ergocalciferol 50000 UNITS capsule  Commonly known as:  VITAMIN D2  Take 50,000 Units by mouth every Wednesday.     glimepiride 4 MG tablet  Commonly known as:  AMARYL  Take 2 mg by mouth daily with breakfast.     insulin NPH Human 100 UNIT/ML injection  Commonly known as:  HUMULIN N,NOVOLIN N  Inject 5  Units into the skin at bedtime.     Iron 325 (65 FE) MG Tabs  Take 1 tablet by mouth daily.     losartan 50 MG tablet  Commonly known as:  COZAAR  Take 1 tablet (50 mg total) by mouth daily.     metFORMIN 1000 MG tablet  Commonly known as:  GLUCOPHAGE  Take 500 mg by mouth 2 (two) times daily with a meal. Per pt Dr. Emeline General instructed to decrease dosage to 500 mg twice a day     pioglitazone 30 MG tablet  Commonly known as:  ACTOS  Take 30 mg by mouth daily.     SMARTEST TEST test strip  Generic drug:  glucose blood  1 each by Other route 2 (two) times daily. Use as instructed     spironolactone 25 MG tablet  Commonly known as:  ALDACTONE  Take 1 tablet (25 mg total) by mouth daily.     vitamin B-12 1000 MCG tablet  Commonly known as:  CYANOCOBALAMIN  Take 1,000 mcg by mouth daily.        Disposition:  wound check followup sk 12 weeks instructtions given   Duration of Discharge Encounter: Greater than 30 minutes including physician time.  Signed,

## 2013-10-04 ENCOUNTER — Telehealth: Payer: Self-pay | Admitting: Internal Medicine

## 2013-10-04 NOTE — Telephone Encounter (Signed)
Informed pt that it is ok to get under the hair dryer. Pt verbalized understanding.

## 2013-10-04 NOTE — Telephone Encounter (Signed)
NEW MESSAGE  PT CALLED TO DISCUSS IF IT IS OK TO SIT UNDER A HAIR DRYER WITH HER NEWLY IMPLANTED DEVICE. PLEASE CALL

## 2013-10-08 ENCOUNTER — Telehealth: Payer: Self-pay | Admitting: Physician Assistant

## 2013-10-08 NOTE — Telephone Encounter (Signed)
The patient called stating she is experiencing some "stinging" pain at the ICD incision which comes and goes.  No signs of infection(swelling or erythema).  She was concerned about it shocking her.  I reassured her it was not a shock.  She has a wound check scheduled for 10/16/13.  If pain worsens she can take tylenol and call for a sooner appt.  Wilburt Finlay PAC

## 2013-10-11 ENCOUNTER — Ambulatory Visit: Payer: Medicare Other

## 2013-10-16 ENCOUNTER — Ambulatory Visit (INDEPENDENT_AMBULATORY_CARE_PROVIDER_SITE_OTHER): Payer: Medicare Other | Admitting: *Deleted

## 2013-10-16 DIAGNOSIS — I255 Ischemic cardiomyopathy: Secondary | ICD-10-CM

## 2013-10-16 DIAGNOSIS — I2589 Other forms of chronic ischemic heart disease: Secondary | ICD-10-CM

## 2013-10-16 LAB — MDC_IDC_ENUM_SESS_TYPE_INCLINIC
Brady Statistic RV Percent Paced: 0.01 %
HIGH POWER IMPEDANCE MEASURED VALUE: 190 Ohm
HighPow Impedance: 41 Ohm
Lead Channel Pacing Threshold Amplitude: 0.5 V
Lead Channel Pacing Threshold Pulse Width: 0.4 ms
Lead Channel Setting Pacing Amplitude: 3.5 V
Lead Channel Setting Sensing Sensitivity: 0.3 mV
MDC IDC MSMT BATTERY REMAINING LONGEVITY: 137 mo
MDC IDC MSMT BATTERY VOLTAGE: 3.12 V
MDC IDC MSMT LEADCHNL RV IMPEDANCE VALUE: 361 Ohm
MDC IDC MSMT LEADCHNL RV SENSING INTR AMPL: 12.5 mV
MDC IDC MSMT LEADCHNL RV SENSING INTR AMPL: 15 mV
MDC IDC SESS DTM: 20150824113949
MDC IDC SET LEADCHNL RV PACING PULSEWIDTH: 0.4 ms
MDC IDC SET ZONE DETECTION INTERVAL: 250 ms
MDC IDC SET ZONE DETECTION INTERVAL: 450 ms
Zone Setting Detection Interval: 300 ms

## 2013-10-16 NOTE — Progress Notes (Signed)

## 2013-11-08 ENCOUNTER — Ambulatory Visit: Payer: Self-pay | Admitting: Podiatry

## 2013-11-22 ENCOUNTER — Telehealth: Payer: Self-pay | Admitting: *Deleted

## 2013-11-22 ENCOUNTER — Ambulatory Visit (INDEPENDENT_AMBULATORY_CARE_PROVIDER_SITE_OTHER): Payer: 59 | Admitting: Podiatry

## 2013-11-22 ENCOUNTER — Encounter: Payer: Self-pay | Admitting: Podiatry

## 2013-11-22 ENCOUNTER — Ambulatory Visit (INDEPENDENT_AMBULATORY_CARE_PROVIDER_SITE_OTHER): Payer: 59

## 2013-11-22 VITALS — BP 121/68 | HR 62 | Resp 12

## 2013-11-22 DIAGNOSIS — R52 Pain, unspecified: Secondary | ICD-10-CM

## 2013-11-22 DIAGNOSIS — M7660 Achilles tendinitis, unspecified leg: Secondary | ICD-10-CM

## 2013-11-22 MED ORDER — DICLOFENAC SODIUM 1 % TD GEL: 2.0000 g | Freq: Four times a day (QID) | TRANSDERMAL | Status: DC

## 2013-11-22 NOTE — Telephone Encounter (Signed)
Someone from your office called me I guess this morning and yesterday also.  I have an appointment there tomorrow at 10.  I am just trying to call an let them know I got the message.  If there's any instructions, please give me a call.  I'll talk with you soon hopefully.  Have a good day.

## 2013-11-22 NOTE — Progress Notes (Signed)
   Subjective:    Patient ID: Ashlee [name], female    DOB: 01-06-1941, 73 y.o.   MRN: [id number]  HPI N-SORE L-LT FOOT BACK OF THE HEEL D-1 YEAR O-SLOWLY  C-WORSE A-PRESSURE, WALKING T-WRAP WITH ADHESIVE TAPE   Review of Systems  All other systems reviewed and are negative.      Objective:   Physical Exam  Orientated x3 black female  Vascular: DP to 4/4 bilaterally PTs 1/4 bilaterally  Neurological: Sensation to 10 g monofilament wire intact 5/5 right and 3/5 left Vibratory sensation intact bilaterally Ankle reflexes reactive bilaterally  Dermatological: Texture and turgor were adequate bilaterally  Musculoskeletal: Pes planus bilaterally HAV deformities bilaterally Patient able to heel off unilaterally and bilaterally Palpable tenderness in the left tendo Achilles and insertional area on the left without any palpable lesions or defects.  X-ray examination left foot  Intact bony structure without fracture and/or dislocation  Pes planus  Well-organized inferior calcaneal spur  Small posterior calcaneal spur  Radiographic impression:  No acute bony abnormality noted left foot                    Assessment & Plan:   Assessment: Diminished posterior tibial pulses bilaterally Protective sensation intact bilaterally Achilles tendinitis left without any obvious tears  Plan:  Advised bent knee stretching 3 times a day x2 minutes Rx diclofenac gel 1% apply 2 g 4 times a day to the left tendo Achilles area Wear a shoes with wedge heels  Reappoint at patient's request if symptoms are not improving are trending down the next 6-8 weeks

## 2013-11-22 NOTE — Patient Instructions (Signed)
Rub 2 g of diclofenac gel up to 4 times a day into the sore tendon area Achilles Tendinitis Achilles tendinitis is inflammation of the tough, cord-like band that attaches the lower muscles of your leg to your heel (Achilles tendon). It is usually caused by overusing the tendon and joint involved.  CAUSES Achilles tendinitis can happen because of:  A sudden increase in exercise or activity (such as running).  Doing the same exercises or activities (such as jumping) over and over.  Not warming up calf muscles before exercising.  Exercising in shoes that are worn out or not made for exercise.  Having arthritis or a bone growth on the back of the heel bone. This can rub against the tendon and hurt the tendon. SIGNS AND SYMPTOMS The most common symptoms are:  Pain in the back of the leg, just above the heel. The pain usually gets worse with exercise and better with rest.  Stiffness or soreness in the back of the leg, especially in the morning.  Swelling of the skin over the Achilles tendon.  Trouble standing on tiptoe. Sometimes, an Achilles tendon tears (ruptures). Symptoms of an Achilles tendon rupture can include:  Sudden, severe pain in the back of the leg.  Trouble putting weight on the foot or walking normally. DIAGNOSIS Achilles tendinitis will be diagnosed based on symptoms and a physical examination. An X-ray may be done to check if another condition is causing your symptoms. An MRI may be ordered if your health care provider suspects you may have completely torn your tendon, which is called an Achilles tendon rupture.  TREATMENT  Achilles tendinitis usually gets better over time. It can take weeks to months to heal completely. Treatment focuses on treating the symptoms and helping the injury heal. HOME CARE INSTRUCTIONS   Rest your Achilles tendon and avoid activities that cause pain.  Apply ice to the injured area:  Put ice in a plastic bag.  Place a towel between your  skin and the bag.  Leave the ice on for 20 minutes, 2-3 times a day  Try to avoid using the tendon (other than gentle range of motion) while the tendon is painful. Do not resume use until instructed by your health care provider. Then begin use gradually. Do not increase use to the point of pain. If pain does develop, decrease use and continue the above measures. Gradually increase activities that do not cause discomfort until you achieve normal use.  Do exercises to make your calf muscles stronger and more flexible. Your health care provider or physical therapist can recommend exercises for you to do.  Wrap your ankle with an elastic bandage or other wrap. This can help keep your tendon from moving too much. Your health care provider will show you how to wrap your ankle correctly.  Only take over-the-counter or prescription medicines for pain, discomfort, or fever as directed by your health care provider. SEEK MEDICAL CARE IF:   Your pain and swelling increase or pain is uncontrolled with medicines.  You develop new, unexplained symptoms or your symptoms get worse.  You are unable to move your toes or foot.  You develop warmth and swelling in your foot.  You have an unexplained temperature. MAKE SURE YOU:   Understand these instructions.  Will watch your condition.  Will get help right away if you are not doing well or get worse. Document Released: 11/19/2004 Document Revised: 11/30/2012 Document Reviewed: 09/21/2012 [facility] Patient Information 2015 [place], [place]. This information is  not intended to replace advice given to you by your health care provider. Make sure you discuss any questions you have with your health care provider.

## 2013-11-23 ENCOUNTER — Encounter: Payer: Self-pay | Admitting: Podiatry

## 2013-11-30 ENCOUNTER — Telehealth: Payer: Self-pay | Admitting: *Deleted

## 2013-11-30 NOTE — Telephone Encounter (Addendum)
I'm a patient of Dr. Leeanne Deed.  I have a concern about some medicine I got since I was there 2-3 days ago.  He gave me some medicine for Tendonitis in the back of my left heel.  I just picked the medicine up 2 days ago.  When we read the instructions, I'm really kind of concerned and a little bit fearful.  I have a heart problem and I had a defibrillator put in and I've had heart surgery.  Even though the heart surgery was several months ago.  In looking at the instructions on the medication, there are some risks for people who have heart problems.  The prescription is for Voltaren.  I guess what I want to know is if there is anything else he can prescribe for me?  If you call me leave me a message, [facility].  I want to know if there is any real problems I could face.  Is there anything else he can prescribe me?  Thank you, have a good day.

## 2013-12-01 NOTE — Telephone Encounter (Signed)
I called and left her a message that Dr. Leeanne Deed is not in the office until Monday.  I will present him with your question at that time.

## 2013-12-04 NOTE — Telephone Encounter (Signed)
I called and informed her that Dr. Leeanne Deed said there is no other medication to prescribe.  Okay to use Tylenol over the counter.  He said to ask your Cardiologist if it's okay to use Voltaren Gel.  She stated, "Okay sounds good, I will call him and ask if it is okay.  Thanks for calling."

## 2013-12-05 ENCOUNTER — Telehealth: Payer: Self-pay | Admitting: Cardiology

## 2013-12-05 NOTE — Telephone Encounter (Signed)
Left message for patient, will be okay to use voltaren gel.

## 2013-12-05 NOTE — Telephone Encounter (Signed)
New message      Pt has been prescribed voltaren gel for tendonitis in her heel.  Can a heart patient use this?  Ok to leave message on vm

## 2013-12-20 NOTE — Progress Notes (Signed)
HPI: FU CAD. Carotid dopplers 5/14 revealed no obstructive disease. Cardiac catheterization in May of 2014 revealed: LAD was occluded. There was a 50% diagonal with an inferior sub-branch that had a 90% lesion. There is an 80% circumflex. There is a 50% RCA followed by a 99% lesion. There is a distal 99% before the bifurcation into the PDA. Cardiac MRI in May 2014 showed severe left ventricular enlargement with an ejection fraction of 34%. There was subendocardial scar in the distal anterior wall, septum and apical wall suggesting viability. There was moderate left atrial enlargement and mild right atrial enlargement. Patient subsequently had coronary artery bypassing graft with a LIMA to the LAD, sequential saphenous vein graft to the diagonal and first marginal and a saphenous vein graft to the PDA. Echocardiogram repeated in June of 2015 showed EF 30. Patient had ICD placed 8/15. Since she was last seen, She denies dyspnea, chest pain, palpitations or syncope.  Current Outpatient Prescriptions  Medication Sig Dispense Refill  . aspirin 81 MG tablet Take 81 mg by mouth daily.      Marland Kitchen BEPREVE 1.5 % SOLN Place 1 drop into both eyes as needed.      . carvedilol (COREG) 6.25 MG tablet Take 1 tablet (6.25 mg total) by mouth 2 (two) times daily.  60 tablet  12  . CRESTOR 10 MG tablet Take 1 tablet by mouth daily.      . diclofenac sodium (VOLTAREN) 1 % GEL Apply 2 g topically 4 (four) times daily.  100 Tube  1  . ergocalciferol (VITAMIN D2) 50000 UNITS capsule Take 50,000 Units by mouth every Wednesday.       Marland Kitchen exenatide (BYETTA 10 MCG PEN) 10 MCG/0.04ML SOLN Inject 10 mcg into the skin 2 (two) times daily with a meal. bid      . Ferrous Sulfate (IRON) 325 (65 FE) MG TABS Take 1 tablet by mouth daily.      Marland Kitchen glimepiride (AMARYL) 4 MG tablet Take 2 mg by mouth daily with breakfast.      . glucose blood (SMARTEST TEST) test strip 1 each by Other route 2 (two) times daily. Use as instructed      .  insulin NPH (HUMULIN N,NOVOLIN N) 100 UNIT/ML injection Inject 5 Units into the skin at bedtime.       Marland Kitchen losartan (COZAAR) 50 MG tablet Take 1 tablet (50 mg total) by mouth daily.  90 tablet  3  . metFORMIN (GLUCOPHAGE) 1000 MG tablet Take 500 mg by mouth 2 (two) times daily with a meal. Per pt Dr. Emeline General instructed to decrease dosage to 500 mg twice a day      . Multiple Vitamins-Minerals (CENTRUM SILVER ADULT 50+ PO) Take 1 tablet by mouth daily.      . pioglitazone (ACTOS) 30 MG tablet Take 30 mg by mouth daily.        Marland Kitchen spironolactone (ALDACTONE) 25 MG tablet Take 1 tablet (25 mg total) by mouth daily.  90 tablet  3  . vitamin B-12 (CYANOCOBALAMIN) 1000 MCG tablet Take 1,000 mcg by mouth daily.       No current facility-administered medications for this visit.     Past Medical History  Diagnosis Date  . CAD (coronary artery disease) 1998    stent post heart attack  . Hypertension   . Hyperlipidemia   . Myocardial infarction 1998  . PONV (postoperative nausea and vomiting)   . Shortness of breath     "  occasionally; could happen at any time" (07/06/2012)  . DM type 2 (diabetes mellitus, type 2)   . Iron deficiency anemia   . Migraines     "ages [age] thru [age]; associated w/menstral cycle" (07/06/2012)  . Arthritis     "in my knees" (07/06/2012)    Past Surgical History  Procedure Laterality Date  . Tubal ligation    . Tonsillectomy  1952  . Coronary angioplasty with stent placement  1998    LAD/notes 07/07/2012  . Cardiac catheterization  07/06/2012  . Shoulder hemi-arthroplasty Right ?2009    "fell and crushed it" (07/06/2012)  . Shoulder arthroscopy Right 07/25/2007    exam under anesthesia; arthroscopic biceps tenotomy; partial labral tear debridement; spur excision/notes 07/25/2007 (07/06/2012)  . Reduction mammaplasty    . Breast biopsy Right     "thought they saw something real tiny; it was a stitch left by OR when breast reduced" (07/06/2012)  . Coronary artery bypass graft  N/A 07/11/2012    Procedure: CORONARY ARTERY BYPASS GRAFTING (CABG);  Surgeon: Loreli SlotSteven C Hendrickson, MD;  [facility];  Service: Open Heart Surgery;  Laterality: N/A;  x4, using left internal mammary and right greater saphenous vein.     History   Social History  . Marital Status: Married    Spouse Name: N/A    Number of Children: N/A  . Years of Education: N/A   Occupational History  . Not on file.   Social History Main Topics  . Smoking status: Never Smoker   . Smokeless tobacco: Never Used  . Alcohol Use: Yes     Comment: 07/06/2012 "glass of wine 1-2X/year"  . Drug Use: No  . Sexual Activity: Not Currently   Other Topics Concern  . Not on file   Social History Narrative  . No narrative on file    ROS: no fevers or chills, productive cough, hemoptysis, dysphasia, odynophagia, melena, hematochezia, dysuria, hematuria, rash, seizure activity, orthopnea, PND, pedal edema, claudication. Remaining systems are negative.  Physical Exam: Well-developed well-nourished in no acute distress.  Skin is warm and dry.  HEENT is normal.  Neck is supple.  Chest is clear to auscultation with normal expansion. ICD left chest Cardiovascular exam is regular rate and rhythm.  Abdominal exam nontender or distended. No masses palpated. Extremities show no edema. neuro grossly intact

## 2013-12-21 ENCOUNTER — Ambulatory Visit (INDEPENDENT_AMBULATORY_CARE_PROVIDER_SITE_OTHER): Payer: Medicare Other | Admitting: Cardiology

## 2013-12-21 ENCOUNTER — Encounter: Payer: Self-pay | Admitting: Cardiology

## 2013-12-21 ENCOUNTER — Other Ambulatory Visit: Payer: Self-pay | Admitting: Cardiology

## 2013-12-21 VITALS — BP 122/68 | HR 68 | Ht 63.0 in | Wt 203.4 lb

## 2013-12-21 DIAGNOSIS — I2510 Atherosclerotic heart disease of native coronary artery without angina pectoris: Secondary | ICD-10-CM

## 2013-12-21 DIAGNOSIS — I10 Essential (primary) hypertension: Secondary | ICD-10-CM

## 2013-12-21 DIAGNOSIS — I429 Cardiomyopathy, unspecified: Secondary | ICD-10-CM

## 2013-12-21 DIAGNOSIS — E785 Hyperlipidemia, unspecified: Secondary | ICD-10-CM

## 2013-12-21 DIAGNOSIS — Z95810 Presence of automatic (implantable) cardiac defibrillator: Secondary | ICD-10-CM | POA: Insufficient documentation

## 2013-12-21 LAB — BASIC METABOLIC PANEL WITH GFR
BUN: 15 mg/dL (ref 6–23)
CALCIUM: 9.3 mg/dL (ref 8.4–10.5)
CO2: 24 mEq/L (ref 19–32)
Chloride: 106 mEq/L (ref 96–112)
Creat: 0.95 mg/dL (ref 0.50–1.10)
GFR, Est African American: 68 mL/min
GFR, Est Non African American: 59 mL/min — ABNORMAL LOW
GLUCOSE: 129 mg/dL — AB (ref 70–99)
POTASSIUM: 4.3 meq/L (ref 3.5–5.3)
Sodium: 139 mEq/L (ref 135–145)

## 2013-12-21 LAB — LIPID PANEL
Cholesterol: 160 mg/dL (ref 0–200)
HDL: 73 mg/dL (ref >39)
LDL CALC: 79 mg/dL (ref 0–99)
Total CHOL/HDL Ratio: 2.2 Ratio
Triglycerides: 42 mg/dL (ref <150)
VLDL: 8 mg/dL (ref 0–40)

## 2013-12-21 LAB — HEPATIC FUNCTION PANEL
ALK PHOS: 49 U/L (ref 39–117)
ALT: 10 U/L (ref 0–35)
AST: 22 U/L (ref 0–37)
Albumin: 4.2 g/dL (ref 3.5–5.2)
BILIRUBIN INDIRECT: 0.4 mg/dL (ref 0.2–1.2)
Bilirubin, Direct: 0.1 mg/dL (ref 0.0–0.3)
TOTAL PROTEIN: 7.2 g/dL (ref 6.0–8.3)
Total Bilirubin: 0.5 mg/dL (ref 0.2–1.2)

## 2013-12-21 MED ORDER — CARVEDILOL 12.5 MG PO TABS: 12.5000 mg | ORAL_TABLET | Freq: Two times a day (BID) | ORAL | Status: DC

## 2013-12-21 NOTE — Assessment & Plan Note (Signed)
Blood pressure controlled. Continue present medications. 

## 2013-12-21 NOTE — Assessment & Plan Note (Signed)
Followed by electrophysiology. 

## 2013-12-21 NOTE — Assessment & Plan Note (Signed)
Continue aspirin and statin. 

## 2013-12-21 NOTE — Assessment & Plan Note (Signed)
Continue statin. Check lipids and liver. 

## 2013-12-21 NOTE — Patient Instructions (Signed)
Your physician wants you to follow-up in: 6 MONTHS WITH DR Jens Som You will receive a reminder letter in the mail two months in advance. If you don't receive a letter, please call our office to schedule the follow-up appointment.   Your physician recommends that you HAVE LAB WORK TODAY  INCREASE CARVEDILOL TO 12.5 MG TWICE DAILY= 2 OF THE 6.25 MG TABLETS TWICE DAILY

## 2013-12-21 NOTE — Assessment & Plan Note (Signed)
Continue ARB. Increase carvedilol to 12.5 mg by mouth twice a day. Check potassium and renal function.

## 2013-12-22 ENCOUNTER — Encounter: Payer: Self-pay | Admitting: *Deleted

## 2013-12-28 ENCOUNTER — Encounter: Payer: Self-pay | Admitting: Internal Medicine

## 2014-01-10 ENCOUNTER — Encounter: Payer: Self-pay | Admitting: Internal Medicine

## 2014-01-10 ENCOUNTER — Ambulatory Visit (INDEPENDENT_AMBULATORY_CARE_PROVIDER_SITE_OTHER): Payer: Medicare Other | Admitting: Internal Medicine

## 2014-01-10 VITALS — BP 110/58 | HR 69 | Ht 64.0 in | Wt 202.0 lb

## 2014-01-10 DIAGNOSIS — I5022 Chronic systolic (congestive) heart failure: Secondary | ICD-10-CM

## 2014-01-10 DIAGNOSIS — I255 Ischemic cardiomyopathy: Secondary | ICD-10-CM

## 2014-01-10 DIAGNOSIS — Z4502 Encounter for adjustment and management of automatic implantable cardiac defibrillator: Secondary | ICD-10-CM

## 2014-01-10 LAB — MDC_IDC_ENUM_SESS_TYPE_INCLINIC
Date Time Interrogation Session: 20151118113844
HighPow Impedance: 171 Ohm
HighPow Impedance: 50 Ohm
Lead Channel Pacing Threshold Amplitude: 0.75 V
Lead Channel Pacing Threshold Pulse Width: 0.4 ms
Lead Channel Sensing Intrinsic Amplitude: 18.375 mV
MDC IDC MSMT BATTERY REMAINING LONGEVITY: 136 mo
MDC IDC MSMT BATTERY VOLTAGE: 3.13 V
MDC IDC MSMT LEADCHNL RV IMPEDANCE VALUE: 399 Ohm
MDC IDC SET LEADCHNL RV PACING AMPLITUDE: 2.5 V
MDC IDC SET LEADCHNL RV PACING PULSEWIDTH: 0.4 ms
MDC IDC SET LEADCHNL RV SENSING SENSITIVITY: 0.3 mV
MDC IDC SET ZONE DETECTION INTERVAL: 250 ms
MDC IDC STAT BRADY RV PERCENT PACED: 0.01 %
Zone Setting Detection Interval: 300 ms
Zone Setting Detection Interval: 450 ms

## 2014-01-10 NOTE — Progress Notes (Signed)
Patient Care Team: Junious Silk, MD as PCP - General (Endocrinology) Lewayne Bunting, MD as Attending Physician (Cardiology)   HPI  Ashlee [name] is a 73 y.o. female Seen following ICD implantation 8/15. 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  23%. Catheterization May 2014>> demonstrated three-vessel disease .    The patient denies chest pain, shortness of breath, nocturnal dyspnea, orthopnea or peripheral edema.  There have been no palpitations, lightheadedness or syncope.    no issues related to device  Past Medical History  Diagnosis Date  . CAD (coronary artery disease) 1998    stent post heart attack  . Hypertension   . Hyperlipidemia   . Myocardial infarction 1998  . PONV (postoperative nausea and vomiting)   . Shortness of breath     "occasionally; could happen at any time" (07/06/2012)  . DM type 2 (diabetes mellitus, type 2)   . Iron deficiency anemia   . Migraines     "ages 37 thru 25; associated w/menstral cycle" (07/06/2012)  . Arthritis     "in my knees" (07/06/2012)    Past Surgical History  Procedure Laterality Date  . Tubal ligation    . Tonsillectomy  1952  . Coronary angioplasty with stent placement  1998    LAD/notes 07/07/2012  . Cardiac catheterization  07/06/2012  . Shoulder hemi-arthroplasty Right ?2009    "fell and crushed it" (07/06/2012)  . Shoulder arthroscopy Right 07/25/2007    exam under anesthesia; arthroscopic biceps tenotomy; partial labral tear debridement; spur excision/notes 07/25/2007 (07/06/2012)  . Reduction mammaplasty    . Breast biopsy Right     "thought they saw something real tiny; it was a stitch left by OR when breast reduced" (07/06/2012)  . Coronary artery bypass graft N/A 07/11/2012    Procedure: CORONARY ARTERY BYPASS GRAFTING (CABG);  Surgeon: Loreli Slot, MD;  Location: [facility] OR;  Service: Open Heart Surgery;   Laterality: N/A;  x4, using left internal mammary and right greater saphenous vein.     Current Outpatient Prescriptions  Medication Sig Dispense Refill  . aspirin 81 MG tablet Take 81 mg by mouth daily.    Marland Kitchen BEPREVE 1.5 % SOLN Place 1 drop into both eyes as needed.    . carvedilol (COREG) 12.5 MG tablet Take 1 tablet (12.5 mg total) by mouth 2 (two) times daily. 60 tablet 12  . CRESTOR 10 MG tablet Take 1 tablet by mouth daily.    . diclofenac sodium (VOLTAREN) 1 % GEL Apply 2 g topically 4 (four) times daily. 100 Tube 1  . ergocalciferol (VITAMIN D2) 50000 UNITS capsule Take 50,000 Units by mouth every Wednesday.     Marland Kitchen exenatide (BYETTA 10 MCG PEN) 10 MCG/0.04ML SOLN Inject 10 mcg into the skin 2 (two) times daily with a meal. bid    . Ferrous Sulfate (IRON) 325 (65 FE) MG TABS Take 1 tablet by mouth daily.    Marland Kitchen glimepiride (AMARYL) 4 MG tablet Take 2 mg by mouth daily with breakfast.    . insulin NPH (HUMULIN N,NOVOLIN N) 100 UNIT/ML injection Inject 5 Units into the skin at bedtime.     Marland Kitchen losartan (COZAAR) 50 MG tablet Take 1 tablet (50 mg total) by mouth daily. 90 tablet 3  . metFORMIN (GLUCOPHAGE) 1000 MG tablet Take 500 mg by mouth 2 (two) times daily with a meal. Per pt Dr. Emeline General instructed  to decrease dosage to 500 mg twice a day    . Multiple Vitamins-Minerals (CENTRUM SILVER ADULT 50+ PO) Take 1 tablet by mouth daily.    . pioglitazone (ACTOS) 30 MG tablet Take 30 mg by mouth daily.      Marland Kitchen. spironolactone (ALDACTONE) 25 MG tablet take 1 tablet by mouth once daily 90 tablet 3  . vitamin B-12 (CYANOCOBALAMIN) 1000 MCG tablet Take 1,000 mcg by mouth daily.    Marland Kitchen. glucose blood (SMARTEST TEST) test strip 1 each by Other route 2 (two) times daily. Use as instructed     No current facility-administered medications for this visit.    Allergies  Allergen Reactions  . Demerol Nausea And Vomiting    Perfuse vomitting  . Percodan [Oxycodone-Aspirin] Nausea And Vomiting    Perfuse  vomitting    Review of Systems negative except from HPI and PMH  Physical Exam BP 110/58 mmHg  Pulse 69  Ht 5\' 4"  (1.626 m)  Wt 91.627 kg (202 lb)  BMI 34.66 kg/m2 Well developed and well nourished in no acute distress HENT normal E scleral and icterus clear Neck Supple JVP flat; carotids brisk and full Device pocket well healed; without hematoma or erythema.  There is no tethering  Clear to ausculation  Regular rate and rhythm, 2/6 systolic murmur Soft with active bowel sounds No clubbing cyanosis  Edema Alert and oriented, grossly normal motor and sensory function Skin Warm and Dry      Assessment and  Plan  Ischemic cardiomyopathy  Congestive heart failure-chronic-systolic class II  Implantable defibrillator [employer] The patient's device was interrogated and the information was fully reviewed.  The device was reprogrammed to minimize outputs  Will see in 9 months

## 2014-01-10 NOTE — Patient Instructions (Signed)
Your physician recommends that you continue on your current medications as directed. Please refer to the Current Medication list given to you today.  Your physician wants you to follow-up in: August 2016 with Dr. Graciela Husbands. You will receive a reminder letter in the mail two months in advance. If you don't receive a letter, please call our office to schedule the follow-up appointment.

## 2014-02-01 ENCOUNTER — Encounter (HOSPITAL_COMMUNITY): Payer: Self-pay | Admitting: Internal Medicine

## 2014-04-12 ENCOUNTER — Telehealth: Payer: Self-pay | Admitting: Cardiology

## 2014-04-12 ENCOUNTER — Encounter: Payer: Medicare Other | Admitting: *Deleted

## 2014-04-12 NOTE — Telephone Encounter (Signed)
LMOVM reminding pt to send remote transmission.   

## 2014-04-13 ENCOUNTER — Encounter: Payer: Self-pay | Admitting: Cardiology

## 2014-04-16 ENCOUNTER — Telehealth: Payer: Self-pay | Admitting: Internal Medicine

## 2014-04-16 NOTE — Telephone Encounter (Signed)
New Message     Patient is returning call made to them last week. Patient was out of town due to a death in family and just now getting message.. Please call back

## 2014-04-16 NOTE — Telephone Encounter (Signed)
LMOVM for pt to return call 

## 2014-04-17 NOTE — Telephone Encounter (Signed)
Attempted to help pt trouble shoot monitor. After several attempts I was unsuccessful and directed pt to tech services. Pt verbalized understanding.

## 2014-04-24 ENCOUNTER — Telehealth (HOSPITAL_COMMUNITY): Payer: Self-pay | Admitting: Cardiology

## 2014-04-24 NOTE — Telephone Encounter (Signed)
Received records from [facility] for appointment on 06/21/14 with Dr Jens Som.  Records given to [facility] (medical records) for Dr Ludwig Clarks schedule on 06/21/14.  lp

## 2014-05-09 ENCOUNTER — Ambulatory Visit (INDEPENDENT_AMBULATORY_CARE_PROVIDER_SITE_OTHER): Payer: Medicare Other | Admitting: *Deleted

## 2014-05-09 DIAGNOSIS — Z95810 Presence of automatic (implantable) cardiac defibrillator: Secondary | ICD-10-CM

## 2014-05-09 DIAGNOSIS — I255 Ischemic cardiomyopathy: Secondary | ICD-10-CM

## 2014-05-09 LAB — MDC_IDC_ENUM_SESS_TYPE_INCLINIC
Battery Remaining Longevity: 135 mo
Brady Statistic RV Percent Paced: 0.02 %
Date Time Interrogation Session: 20160316143556
HIGH POWER IMPEDANCE MEASURED VALUE: 171 Ohm
HighPow Impedance: 45 Ohm
Lead Channel Impedance Value: 399 Ohm
Lead Channel Pacing Threshold Amplitude: 0.75 V
Lead Channel Pacing Threshold Pulse Width: 0.4 ms
Lead Channel Sensing Intrinsic Amplitude: 19.25 mV
Lead Channel Setting Pacing Amplitude: 2.5 V
Lead Channel Setting Pacing Pulse Width: 0.4 ms
Lead Channel Setting Sensing Sensitivity: 0.3 mV
MDC IDC MSMT BATTERY VOLTAGE: 3.06 V
MDC IDC MSMT LEADCHNL RV SENSING INTR AMPL: 14 mV
MDC IDC SET ZONE DETECTION INTERVAL: 250 ms
MDC IDC SET ZONE DETECTION INTERVAL: 300 ms
Zone Setting Detection Interval: 450 ms

## 2014-05-09 NOTE — Progress Notes (Signed)
ICD check in clinic. Normal device function. Threshold and sensing consistent with previous device measurements. Impedance trends stable over time. No evidence of any ventricular arrhythmias. Histogram distribution appropriate for patient and level of activity. OptiVol abnormal since 04/22/14. No changes made this session. Device programmed at appropriate safety margins. Device programmed to optimize intrinsic conduction. Estimated longevity 11.25yrs. Pt enrolled in remote follow-up.  Alert tones demonstrated for patient, pt knows to call clinic if heard. [employer] (tentative upon power adapter received/replaced) 08/08/14 & ROV w/ SK in 32mo.

## 2014-05-24 ENCOUNTER — Encounter: Payer: Self-pay | Admitting: Internal Medicine

## 2014-06-12 ENCOUNTER — Telehealth: Payer: Self-pay | Admitting: Cardiology

## 2014-06-12 NOTE — Telephone Encounter (Signed)
Received medical records from [facility] Endocr[facility], [facility]. Sent to Dr. Jens Som. 06/12/14/ss

## 2014-06-13 ENCOUNTER — Telehealth: Payer: Self-pay | Admitting: Cardiology

## 2014-06-13 NOTE — Telephone Encounter (Signed)
Received records from [facility] Endocrinology for appointment with Dr Jens Som on 06/21/14.  Records given to [facility] (medical records) for Dr Ludwig Clarks schedule on 06/21/14.  lp

## 2014-06-19 NOTE — Progress Notes (Signed)
HPI: FU CAD. Carotid dopplers 5/14 revealed no obstructive disease. Cardiac catheterization in May of 2014 revealed: LAD was occluded. There was a 50% diagonal with an inferior sub-branch that had a 90% lesion. There is an 80% circumflex. There is a 50% RCA followed by a 99% lesion. There is a distal 99% before the bifurcation into the PDA. Cardiac MRI in May 2014 showed severe left ventricular enlargement with an ejection fraction of 34%. There was subendocardial scar in the distal anterior wall, septum and apical wall suggesting viability. There was moderate left atrial enlargement and mild right atrial enlargement. Patient subsequently had coronary artery bypassing graft with a LIMA to the LAD, sequential saphenous vein graft to the diagonal and first marginal and a saphenous vein graft to the PDA. Echocardiogram repeated in June of 2015 showed EF 30. Patient had ICD placed 8/15. Since she was last seen,   Current Outpatient Prescriptions  Medication Sig Dispense Refill  . aspirin 81 MG tablet Take 81 mg by mouth daily.    Marland Kitchen BEPREVE 1.5 % SOLN Place 1 drop into both eyes as needed.    . carvedilol (COREG) 12.5 MG tablet Take 1 tablet (12.5 mg total) by mouth 2 (two) times daily. 60 tablet 12  . CRESTOR 10 MG tablet Take 1 tablet by mouth daily.    . diclofenac sodium (VOLTAREN) 1 % GEL Apply 2 g topically 4 (four) times daily. 100 Tube 1  . ergocalciferol (VITAMIN D2) 50000 UNITS capsule Take 50,000 Units by mouth every Wednesday.     Marland Kitchen exenatide (BYETTA 10 MCG PEN) 10 MCG/0.04ML SOLN Inject 10 mcg into the skin 2 (two) times daily with a meal. bid    . Ferrous Sulfate (IRON) 325 (65 FE) MG TABS Take 1 tablet by mouth daily.    Marland Kitchen glimepiride (AMARYL) 4 MG tablet Take 2 mg by mouth daily with breakfast.    . glucose blood (SMARTEST TEST) test strip 1 each by Other route 2 (two) times daily. Use as instructed    . insulin NPH (HUMULIN N,NOVOLIN N) 100 UNIT/ML injection Inject 5 Units into  the skin at bedtime.     Marland Kitchen losartan (COZAAR) 50 MG tablet Take 1 tablet (50 mg total) by mouth daily. 90 tablet 3  . metFORMIN (GLUCOPHAGE) 1000 MG tablet Take 500 mg by mouth 2 (two) times daily with a meal. Per pt Dr. Emeline General instructed to decrease dosage to 500 mg twice a day    . Multiple Vitamins-Minerals (CENTRUM SILVER ADULT 50+ PO) Take 1 tablet by mouth daily.    . pioglitazone (ACTOS) 30 MG tablet Take 30 mg by mouth daily.      Marland Kitchen spironolactone (ALDACTONE) 25 MG tablet take 1 tablet by mouth once daily 90 tablet 3  . vitamin B-12 (CYANOCOBALAMIN) 1000 MCG tablet Take 1,000 mcg by mouth daily.     No current facility-administered medications for this visit.     Past Medical History  Diagnosis Date  . CAD (coronary artery disease) 1998    stent post heart attack  . Hypertension   . Hyperlipidemia   . Myocardial infarction 1998  . PONV (postoperative nausea and vomiting)   . Shortness of breath     "occasionally; could happen at any time" (07/06/2012)  . DM type 2 (diabetes mellitus, type 2)   . Iron deficiency anemia   . Migraines     "ages 46 thru 26; associated w/menstral cycle" (07/06/2012)  . Arthritis     "  in my knees" (07/06/2012)    Past Surgical History  Procedure Laterality Date  . Tubal ligation    . Tonsillectomy  1952  . Coronary angioplasty with stent placement  1998    LAD/notes 07/07/2012  . Cardiac catheterization  07/06/2012  . Shoulder hemi-arthroplasty Right ?2009    "fell and crushed it" (07/06/2012)  . Shoulder arthroscopy Right 07/25/2007    exam under anesthesia; arthroscopic biceps tenotomy; partial labral tear debridement; spur excision/notes 07/25/2007 (07/06/2012)  . Reduction mammaplasty    . Breast biopsy Right     "thought they saw something real tiny; it was a stitch left by OR when breast reduced" (07/06/2012)  . Coronary artery bypass graft N/A 07/11/2012    Procedure: CORONARY ARTERY BYPASS GRAFTING (CABG);  Surgeon: Loreli Slot,  MD;  Location: [facility] OR;  Service: Open Heart Surgery;  Laterality: N/A;  x4, using left internal mammary and right greater saphenous vein.   . Implantable cardioverter defibrillator implant N/A 09/28/2013    Procedure: IMPLANTABLE CARDIOVERTER DEFIBRILLATOR IMPLANT;  Surgeon: Duke Salvia, MD;  Location: [facility] CATH LAB;  Service: Cardiovascular;  Laterality: N/A;    History   Social History  . Marital Status: Married    Spouse Name: N/A  . Number of Children: N/A  . Years of Education: N/A   Occupational History  . Not on file.   Social History Main Topics  . Smoking status: Never Smoker   . Smokeless tobacco: Never Used  . Alcohol Use: Yes     Comment: 07/06/2012 "glass of wine 1-2X/year"  . Drug Use: No  . Sexual Activity: Not Currently   Other Topics Concern  . Not on file   Social History Narrative    ROS: no fevers or chills, productive cough, hemoptysis, dysphasia, odynophagia, melena, hematochezia, dysuria, hematuria, rash, seizure activity, orthopnea, PND, pedal edema, claudication. Remaining systems are negative.  Physical Exam: Well-developed well-nourished in no acute distress.  Skin is warm and dry.  HEENT is normal.  Neck is supple.  Chest is clear to auscultation with normal expansion.  Cardiovascular exam is regular rate and rhythm.  Abdominal exam nontender or distended. No masses palpated. Extremities show no edema. neuro grossly intact  ECG     This encounter was created in error - please disregard.

## 2014-06-21 ENCOUNTER — Encounter: Payer: Self-pay | Admitting: Cardiology

## 2014-07-04 ENCOUNTER — Other Ambulatory Visit: Payer: Self-pay | Admitting: Cardiology

## 2014-07-05 ENCOUNTER — Other Ambulatory Visit: Payer: Self-pay | Admitting: *Deleted

## 2014-07-05 DIAGNOSIS — I10 Essential (primary) hypertension: Secondary | ICD-10-CM

## 2014-07-05 DIAGNOSIS — I2510 Atherosclerotic heart disease of native coronary artery without angina pectoris: Secondary | ICD-10-CM

## 2014-07-05 DIAGNOSIS — E785 Hyperlipidemia, unspecified: Secondary | ICD-10-CM

## 2014-07-05 DIAGNOSIS — I429 Cardiomyopathy, unspecified: Secondary | ICD-10-CM

## 2014-07-05 MED ORDER — CARVEDILOL 12.5 MG PO TABS: 12.5000 mg | ORAL_TABLET | Freq: Two times a day (BID) | ORAL | Status: DC

## 2014-08-08 ENCOUNTER — Encounter: Payer: Medicare Other | Admitting: *Deleted

## 2014-08-08 ENCOUNTER — Telehealth: Payer: Self-pay | Admitting: Cardiology

## 2014-08-08 NOTE — Telephone Encounter (Signed)
LMOVM reminding pt to send remote transmission.   

## 2014-08-09 ENCOUNTER — Ambulatory Visit (INDEPENDENT_AMBULATORY_CARE_PROVIDER_SITE_OTHER): Payer: Medicare Other | Admitting: *Deleted

## 2014-08-09 ENCOUNTER — Telehealth: Payer: Self-pay | Admitting: Internal Medicine

## 2014-08-09 ENCOUNTER — Encounter: Payer: Self-pay | Admitting: Cardiology

## 2014-08-09 DIAGNOSIS — I255 Ischemic cardiomyopathy: Secondary | ICD-10-CM | POA: Diagnosis not present

## 2014-08-09 LAB — CUP PACEART REMOTE DEVICE CHECK
Battery Remaining Longevity: 134 mo
Battery Voltage: 3.04 V
HighPow Impedance: 47 Ohm
Lead Channel Setting Sensing Sensitivity: 0.3 mV
MDC IDC MSMT LEADCHNL RV IMPEDANCE VALUE: 304 Ohm
MDC IDC MSMT LEADCHNL RV IMPEDANCE VALUE: 399 Ohm
MDC IDC MSMT LEADCHNL RV PACING THRESHOLD AMPLITUDE: 0.75 V
MDC IDC MSMT LEADCHNL RV PACING THRESHOLD PULSEWIDTH: 0.4 ms
MDC IDC MSMT LEADCHNL RV SENSING INTR AMPL: 14.375 mV
MDC IDC SESS DTM: 20160616163711
MDC IDC SET LEADCHNL RV PACING AMPLITUDE: 2.5 V
MDC IDC SET LEADCHNL RV PACING PULSEWIDTH: 0.4 ms
MDC IDC SET ZONE DETECTION INTERVAL: 250 ms
MDC IDC STAT BRADY RV PERCENT PACED: 0.03 %
Zone Setting Detection Interval: 300 ms
Zone Setting Detection Interval: 450 ms

## 2014-08-09 NOTE — Telephone Encounter (Signed)
Spoke w/ pt and she is going to set machine up and send her remote transmission.

## 2014-08-09 NOTE — Telephone Encounter (Signed)
Follow Up     Pt is returning call from yesterday regarding transmission. Pt states she does not known what a transmission is.

## 2014-08-10 ENCOUNTER — Telehealth: Payer: Self-pay | Admitting: Internal Medicine

## 2014-08-10 NOTE — Telephone Encounter (Signed)
Follow Up    Pt returning call. States her device machine is working fine. Please call back.

## 2014-08-10 NOTE — Telephone Encounter (Signed)
Spoke to pt regarding her CL transmissions. I informed her that nothing had been received. I checked her monitor SN in [employer] to make sure that it was entered in correctly and it was not. Spoke to a representative of [employer] tech svcs who entered in the correct SN. LMOVM informing patient that the issue had been resolved. I encouraged pt to call if she had questions.

## 2014-08-14 NOTE — Progress Notes (Signed)
Remote ICD transmission.   

## 2014-08-17 ENCOUNTER — Telehealth: Payer: Self-pay | Admitting: Internal Medicine

## 2014-08-17 NOTE — Telephone Encounter (Signed)
LMOM that transmissions have come through- encouraged to call back with further questions.

## 2014-08-17 NOTE — Telephone Encounter (Signed)
New message      Pt received a letter regarding we not getting her defibulator transmissions.  She want to talk to someone about that

## 2014-08-29 ENCOUNTER — Telehealth: Payer: Self-pay | Admitting: Internal Medicine

## 2014-08-29 NOTE — Telephone Encounter (Signed)
New problem    Pt need to speak back with you per you'll conversation last week.

## 2014-08-29 NOTE — Telephone Encounter (Signed)
Spoke w/ pt and informed her that her device is checked once a year by the MD and every 91 days from home. Pt verbalized understanding.

## 2014-08-31 ENCOUNTER — Encounter: Payer: Self-pay | Admitting: Cardiology

## 2014-09-06 ENCOUNTER — Encounter: Payer: Self-pay | Admitting: Internal Medicine

## 2014-09-17 ENCOUNTER — Encounter: Payer: Self-pay | Admitting: Cardiology

## 2014-09-17 ENCOUNTER — Ambulatory Visit (INDEPENDENT_AMBULATORY_CARE_PROVIDER_SITE_OTHER): Payer: Medicare Other | Admitting: Cardiology

## 2014-09-17 DIAGNOSIS — I2583 Coronary atherosclerosis due to lipid rich plaque: Secondary | ICD-10-CM

## 2014-09-17 DIAGNOSIS — E785 Hyperlipidemia, unspecified: Secondary | ICD-10-CM | POA: Diagnosis not present

## 2014-09-17 DIAGNOSIS — I2510 Atherosclerotic heart disease of native coronary artery without angina pectoris: Secondary | ICD-10-CM

## 2014-09-17 DIAGNOSIS — I10 Essential (primary) hypertension: Secondary | ICD-10-CM

## 2014-09-17 DIAGNOSIS — I429 Cardiomyopathy, unspecified: Secondary | ICD-10-CM

## 2014-09-17 MED ORDER — LOSARTAN POTASSIUM 100 MG PO TABS: 100.0000 mg | ORAL_TABLET | Freq: Every day | ORAL | Status: DC

## 2014-09-17 NOTE — Assessment & Plan Note (Signed)
Continue statin. She has not tolerated higher doses previously.

## 2014-09-17 NOTE — Assessment & Plan Note (Signed)
Continue beta blocker. Increase Cozaar to 100 mg daily.

## 2014-09-17 NOTE — Assessment & Plan Note (Signed)
Blood pressure elevated. Increase Cozaar to 100 mg daily. Check potassium and renal function in 1 week. 

## 2014-09-17 NOTE — Progress Notes (Signed)
HPI: FU CAD. Carotid dopplers 5/14 revealed no obstructive disease. Cardiac catheterization in May of 2014 revealed: LAD was occluded. There was a 50% diagonal with an inferior sub-branch that had a 90% lesion. There is an 80% circumflex. There is a 50% RCA followed by a 99% lesion. There is a distal 99% before the bifurcation into the PDA. Cardiac MRI in May 2014 showed severe left ventricular enlargement with an ejection fraction of 34%. There was subendocardial scar in the distal anterior wall, septum and apical wall suggesting viability. There was moderate left atrial enlargement and mild right atrial enlargement. Patient subsequently had coronary artery bypassing graft with a LIMA to the LAD, sequential saphenous vein graft to the diagonal and first marginal and a saphenous vein graft to the PDA. Echocardiogram repeated in June of 2015 showed EF 30. Patient had ICD placed 8/15. Since she was last seen, she denies dyspnea, chest pain, palpitations or syncope.  Current Outpatient Prescriptions  Medication Sig Dispense Refill  . aspirin 81 MG tablet Take 81 mg by mouth daily.    Marland Kitchen BEPREVE 1.5 % SOLN Place 1 drop into both eyes as needed.    . carvedilol (COREG) 12.5 MG tablet Take 1 tablet (12.5 mg total) by mouth 2 (two) times daily. 60 tablet 12  . CRESTOR 10 MG tablet Take 1 tablet by mouth daily.    . diclofenac sodium (VOLTAREN) 1 % GEL Apply 2 g topically 4 (four) times daily. 100 Tube 1  . ergocalciferol (VITAMIN D2) 50000 UNITS capsule Take 50,000 Units by mouth every Wednesday.     Marland Kitchen exenatide (BYETTA 10 MCG PEN) 10 MCG/0.04ML SOLN Inject 10 mcg into the skin 2 (two) times daily with a meal. bid    . Ferrous Sulfate (IRON) 325 (65 FE) MG TABS Take 1 tablet by mouth daily.    Marland Kitchen glimepiride (AMARYL) 4 MG tablet Take 2 mg by mouth daily with breakfast.    . glucose blood (SMARTEST TEST) test strip 1 each by Other route 2 (two) times daily. Use as instructed    . insulin NPH (HUMULIN  N,NOVOLIN N) 100 UNIT/ML injection Inject 5 Units into the skin at bedtime.     Marland Kitchen losartan (COZAAR) 50 MG tablet Take 1 tablet (50 mg total) by mouth daily. 90 tablet 3  . metFORMIN (GLUCOPHAGE) 1000 MG tablet Take 500 mg by mouth 2 (two) times daily with a meal. Per pt Dr. Emeline General instructed to decrease dosage to 500 mg twice a day    . Multiple Vitamins-Minerals (CENTRUM SILVER ADULT 50+ PO) Take 1 tablet by mouth daily.    . pioglitazone (ACTOS) 30 MG tablet Take 30 mg by mouth daily.      Marland Kitchen spironolactone (ALDACTONE) 25 MG tablet take 1 tablet by mouth once daily 90 tablet 3  . vitamin B-12 (CYANOCOBALAMIN) 1000 MCG tablet Take 1,000 mcg by mouth daily.     No current facility-administered medications for this visit.     Past Medical History  Diagnosis Date  . CAD (coronary artery disease) 1998    stent post heart attack  . Hypertension   . Hyperlipidemia   . Myocardial infarction 1998  . PONV (postoperative nausea and vomiting)   . Shortness of breath     "occasionally; could happen at any time" (07/06/2012)  . DM type 2 (diabetes mellitus, type 2)   . Iron deficiency anemia   . Migraines     "ages 72 thru 41; associated  w/menstral cycle" (07/06/2012)  . Arthritis     "in my knees" (07/06/2012)    Past Surgical History  Procedure Laterality Date  . Tubal ligation    . Tonsillectomy  1952  . Coronary angioplasty with stent placement  1998    LAD/notes 07/07/2012  . Cardiac catheterization  07/06/2012  . Shoulder hemi-arthroplasty Right ?2009    "fell and crushed it" (07/06/2012)  . Shoulder arthroscopy Right 07/25/2007    exam under anesthesia; arthroscopic biceps tenotomy; partial labral tear debridement; spur excision/notes 07/25/2007 (07/06/2012)  . Reduction mammaplasty    . Breast biopsy Right     "thought they saw something real tiny; it was a stitch left by OR when breast reduced" (07/06/2012)  . Coronary artery bypass graft N/A 07/11/2012    Procedure: CORONARY ARTERY  BYPASS GRAFTING (CABG);  Surgeon: Loreli Slot, MD;  Location: [facility] OR;  Service: Open Heart Surgery;  Laterality: N/A;  x4, using left internal mammary and right greater saphenous vein.   . Implantable cardioverter defibrillator implant N/A 09/28/2013    Procedure: IMPLANTABLE CARDIOVERTER DEFIBRILLATOR IMPLANT;  Surgeon: Duke Salvia, MD;  Location: [facility] CATH LAB;  Service: Cardiovascular;  Laterality: N/A;    History   Social History  . Marital Status: Married    Spouse Name: N/A  . Number of Children: N/A  . Years of Education: N/A   Occupational History  . Not on file.   Social History Main Topics  . Smoking status: Never Smoker   . Smokeless tobacco: Never Used  . Alcohol Use: Yes     Comment: 07/06/2012 "glass of wine 1-2X/year"  . Drug Use: No  . Sexual Activity: Not Currently   Other Topics Concern  . Not on file   Social History Narrative    ROS: no fevers or chills, productive cough, hemoptysis, dysphasia, odynophagia, melena, hematochezia, dysuria, hematuria, rash, seizure activity, orthopnea, PND, pedal edema, claudication. Remaining systems are negative.  Physical Exam: Well-developed well-nourished in no acute distress.  Skin is warm and dry.  HEENT is normal.  Neck is supple.  Chest is clear to auscultation with normal expansion.  Cardiovascular exam is regular rate and rhythm.  Abdominal exam nontender or distended. No masses palpated. Extremities show no edema. neuro grossly intact  ECG sinus rhythm with occasional PVCs. First-degree AV block. No ST changes.

## 2014-09-17 NOTE — Patient Instructions (Signed)
Your physician wants you to follow-up in: 6 MONTHS WITH DR Jens Som You will receive a reminder letter in the mail two months in advance. If you don't receive a letter, please call our office to schedule the follow-up appointment.   INCREASE LOSartan to 100 mg once dailky= 2 of the 50 mg tablets once daily  Your physician recommends that you return for lab work in: ONE WEEK

## 2014-09-17 NOTE — Assessment & Plan Note (Signed)
Continue aspirin and statin. 

## 2014-09-17 NOTE — Assessment & Plan Note (Signed)
Followed by electrophysiology. 

## 2014-09-26 ENCOUNTER — Other Ambulatory Visit (INDEPENDENT_AMBULATORY_CARE_PROVIDER_SITE_OTHER): Payer: Medicare Other

## 2014-09-26 ENCOUNTER — Other Ambulatory Visit: Payer: Self-pay

## 2014-09-26 DIAGNOSIS — I10 Essential (primary) hypertension: Secondary | ICD-10-CM | POA: Diagnosis not present

## 2014-09-26 LAB — BASIC METABOLIC PANEL WITH GFR
BUN: 11 mg/dL (ref 7–25)
CO2: 23 mmol/L (ref 20–31)
Calcium: 9.2 mg/dL (ref 8.6–10.4)
Chloride: 106 mmol/L (ref 98–110)
Creat: 0.86 mg/dL (ref 0.60–0.93)
GFR, Est African American: 76 mL/min (ref >=60)
GFR, Est Non African American: 66 mL/min (ref >=60)
Glucose, Bld: 161 mg/dL — ABNORMAL HIGH (ref 65–99)
Potassium: 4 mmol/L (ref 3.5–5.3)
Sodium: 139 mmol/L (ref 135–146)

## 2014-10-25 ENCOUNTER — Encounter: Payer: Self-pay | Admitting: Internal Medicine

## 2014-10-25 ENCOUNTER — Ambulatory Visit (INDEPENDENT_AMBULATORY_CARE_PROVIDER_SITE_OTHER): Payer: Medicare Other | Admitting: Internal Medicine

## 2014-10-25 VITALS — BP 120/70 | HR 65 | Ht 63.0 in | Wt 203.6 lb

## 2014-10-25 DIAGNOSIS — R079 Chest pain, unspecified: Secondary | ICD-10-CM

## 2014-10-25 DIAGNOSIS — I5022 Chronic systolic (congestive) heart failure: Secondary | ICD-10-CM | POA: Diagnosis not present

## 2014-10-25 DIAGNOSIS — Z95810 Presence of automatic (implantable) cardiac defibrillator: Secondary | ICD-10-CM

## 2014-10-25 DIAGNOSIS — I255 Ischemic cardiomyopathy: Secondary | ICD-10-CM | POA: Diagnosis not present

## 2014-10-25 DIAGNOSIS — Z4502 Encounter for adjustment and management of automatic implantable cardiac defibrillator: Secondary | ICD-10-CM

## 2014-10-25 LAB — CUP PACEART INCLINIC DEVICE CHECK
Battery Remaining Longevity: 133 mo
Battery Voltage: 3.01 V
Brady Statistic RV Percent Paced: 0.13 %
Date Time Interrogation Session: 20160901175920
HighPow Impedance: 171 Ohm
HighPow Impedance: 48 Ohm
Lead Channel Pacing Threshold Amplitude: 0.75 V
Lead Channel Sensing Intrinsic Amplitude: 16 mV
Lead Channel Setting Pacing Amplitude: 2.5 V
Lead Channel Setting Pacing Pulse Width: 0.4 ms
Lead Channel Setting Sensing Sensitivity: 0.3 mV
MDC IDC MSMT LEADCHNL RV IMPEDANCE VALUE: 361 Ohm
MDC IDC MSMT LEADCHNL RV PACING THRESHOLD PULSEWIDTH: 0.4 ms
Zone Setting Detection Interval: 250 ms
Zone Setting Detection Interval: 300 ms
Zone Setting Detection Interval: 450 ms

## 2014-10-25 MED ORDER — ISOSORBIDE MONONITRATE ER 30 MG PO TB24: 30.0000 mg | ORAL_TABLET | Freq: Every day | ORAL | Status: DC

## 2014-10-25 NOTE — Progress Notes (Signed)
Patient Care Team: Hali Marry, MD as PCP - General (Endocrinology) Lewayne Bunting, MD as Attending Physician (Cardiology)   HPI  Ashlee [name] is a 74 y.o. female Seen following ICD implantation 8/15. 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  23%. Catheterization May 2014>> demonstrated three-vessel disease .    The patient denies  shortness of breath, nocturnal dyspnea, orthopnea or peripheral edema.  There have been no palpitations, lightheadedness or syncope.  She has complaints of back discomfort which is similar to the chest pain syndrome that prompted stress testing in the past and bypass surgery. It had been largely relieved following surgery until about a month or 2 ago. It occurs mostly after meals and occasionally at night. It is not been accelerating.   no issues related to device  Past Medical History  Diagnosis Date  . CAD (coronary artery disease) 1998    stent post heart attack  . Hypertension   . Hyperlipidemia   . Myocardial infarction 1998  . PONV (postoperative nausea and vomiting)   . Shortness of breath     "occasionally; could happen at any time" (07/06/2012)  . DM type 2 (diabetes mellitus, type 2)   . Iron deficiency anemia   . Migraines     "ages 24 thru 27; associated w/menstral cycle" (07/06/2012)  . Arthritis     "in my knees" (07/06/2012)    Past Surgical History  Procedure Laterality Date  . Tubal ligation    . Tonsillectomy  1952  . Coronary angioplasty with stent placement  1998    LAD/notes 07/07/2012  . Cardiac catheterization  07/06/2012  . Shoulder hemi-arthroplasty Right ?2009    "fell and crushed it" (07/06/2012)  . Shoulder arthroscopy Right 07/25/2007    exam under anesthesia; arthroscopic biceps tenotomy; partial labral tear debridement; spur excision/notes 07/25/2007 (07/06/2012)  . Reduction mammaplasty    . Breast biopsy Right    "thought they saw something real tiny; it was a stitch left by OR when breast reduced" (07/06/2012)  . Coronary artery bypass graft N/A 07/11/2012    Procedure: CORONARY ARTERY BYPASS GRAFTING (CABG);  Surgeon: Loreli Slot, MD;  Location: [facility] OR;  Service: Open Heart Surgery;  Laterality: N/A;  x4, using left internal mammary and right greater saphenous vein.   . Implantable cardioverter defibrillator implant N/A 09/28/2013    Procedure: IMPLANTABLE CARDIOVERTER DEFIBRILLATOR IMPLANT;  Surgeon: Duke Salvia, MD;  Location: [facility] CATH LAB;  Service: Cardiovascular;  Laterality: N/A;    Current Outpatient Prescriptions  Medication Sig Dispense Refill  . aspirin 81 MG tablet Take 81 mg by mouth daily.    Marland Kitchen BEPREVE 1.5 % SOLN Place 1 drop into both eyes daily as needed (EYES).     . carvedilol (COREG) 12.5 MG tablet Take 1 tablet (12.5 mg total) by mouth 2 (two) times daily. 60 tablet 12  . CRESTOR 10 MG tablet Take 1 tablet by mouth daily.    . diclofenac sodium (VOLTAREN) 1 % GEL Apply 2 g topically 4 (four) times daily. 100 Tube 1  . ergocalciferol (VITAMIN D2) 50000 UNITS capsule Take 50,000 Units by mouth every Wednesday.     Marland Kitchen exenatide (BYETTA 10 MCG PEN) 10 MCG/0.04ML SOLN Inject 10 mcg into the skin 2 (two) times daily with a meal. bid    . Ferrous Sulfate (IRON) 325 (65 FE) MG TABS Take 1 tablet by mouth  daily.    . glimepiride (AMARYL) 4 MG tablet Take 2 mg by mouth daily with breakfast.    . glucose blood (SMARTEST TEST) test strip 1 each by Other route 2 (two) times daily. Use as instructed    . insulin NPH (HUMULIN N,NOVOLIN N) 100 UNIT/ML injection Inject 5 Units into the skin at bedtime.     Marland Kitchen losartan (COZAAR) 100 MG tablet Take 1 tablet (100 mg total) by mouth daily. 90 tablet 3  . metFORMIN (GLUCOPHAGE) 1000 MG tablet Take 500 mg by mouth 2 (two) times daily with a meal. Per pt Dr. Emeline General instructed to decrease dosage to 500 mg twice a day    . Multiple Vitamins-Minerals  (CENTRUM SILVER ADULT 50+ PO) Take 1 tablet by mouth daily.    . pioglitazone (ACTOS) 30 MG tablet Take 30 mg by mouth daily.      Marland Kitchen spironolactone (ALDACTONE) 25 MG tablet take 1 tablet by mouth once daily 90 tablet 3  . vitamin B-12 (CYANOCOBALAMIN) 1000 MCG tablet Take 1,000 mcg by mouth daily.     No current facility-administered medications for this visit.    Allergies  Allergen Reactions  . Demerol Nausea And Vomiting    Perfuse vomitting  . Percodan [Oxycodone-Aspirin] Nausea And Vomiting    Perfuse vomitting    Review of Systems negative except from HPI and PMH  Physical Exam BP 120/70 mmHg  Pulse 65  Ht 5\' 3"  (1.6 m)  Wt 203 lb 9.6 oz (92.352 kg)  BMI 36.08 kg/m2 Well developed and well nourished in no acute distress HENT normal E scleral and icterus clear Neck Supple JVP flat; carotids brisk and full Device pocket well healed; without hematoma or erythema.  There is no tethering  Clear to ausculation  Regular rate and rhythm, 2/6 systolic murmur Soft with active bowel sounds No clubbing cyanosis  Edema Alert and oriented, grossly normal motor and sensory function Skin Warm and Dry      Assessment and  Plan  Ischemic cardiomyopathy  Atypical recurrent chest pain  Congestive heart failure-chronic-systolic class II  Implantable defibrillator [employer] The patient's device was interrogated and the information was fully reviewed.  The device was reprogrammed to minimize outputs    She has recurrent discomfort similar to her prior pre-bypass discomfort. I offered her stress testing versus empiric medical therapy she would prefer the latter. We will start her on isosorbide will have her follow-up with Dr. Marsa Aris in a few weeks. In the event that has not changed, he may decide to pursue stress testing.Marland Kitchen

## 2014-10-25 NOTE — Patient Instructions (Signed)
Medication Instructions:  Your physician has recommended you make the following change in your medication:  1) START Imdur 30 mg daily  Labwork: None ordered  Testing/Procedures: None ordered  Follow-Up: Your physician recommends that you schedule a follow-up appointment in: 4-6 weeks with Dr. Jens Som.  Your physician wants you to follow-up in: 1 year with Dr. Graciela Husbands.  You will receive a reminder letter in the mail two months in advance. If you don't receive a letter, please call our office to schedule the follow-up appointment.  Any Other Special Instructions Will Be Listed Below (If Applicable). Thank you for choosing [facility]!!   Dory Horn, RN [phone]

## 2014-11-12 ENCOUNTER — Encounter: Payer: Self-pay | Admitting: Internal Medicine

## 2014-12-20 NOTE — Progress Notes (Signed)
HPI: FU CAD. Carotid dopplers 5/14 revealed no obstructive disease. Cardiac catheterization in May of 2014 revealed: LAD was occluded. There was a 50% diagonal with an inferior sub-branch that had a 90% lesion. There is an 80% circumflex. There is a 50% RCA followed by a 99% lesion. There is a distal 99% before the bifurcation into the PDA. Cardiac MRI in May 2014 showed severe left ventricular enlargement with an ejection fraction of 34%. There was subendocardial scar in the distal anterior wall, septum and apical wall suggesting viability. There was moderate left atrial enlargement and mild right atrial enlargement. Patient subsequently had coronary artery bypassing graft with a LIMA to the LAD, sequential saphenous vein graft to the diagonal and first marginal and a saphenous vein graft to the PDA. Echocardiogram repeated in June of 2015 showed EF 30. Patient had ICD placed 8/15. Seen recently by Dr. Graciela Husbands and nitrates added for chest pain. Since she was last seen, She denies dyspnea, chest pain, palpitations or syncope. She states she is having increased frequency of headaches in the past 1 month.  Current Outpatient Prescriptions  Medication Sig Dispense Refill  . aspirin 81 MG tablet Take 81 mg by mouth daily.    Marland Kitchen BEPREVE 1.5 % SOLN Place 1 drop into both eyes daily as needed (EYES).     . carvedilol (COREG) 12.5 MG tablet Take 1 tablet (12.5 mg total) by mouth 2 (two) times daily. 60 tablet 12  . CRESTOR 10 MG tablet Take 1 tablet by mouth daily.    . diclofenac sodium (VOLTAREN) 1 % GEL Apply 2 g topically 4 (four) times daily. 100 Tube 1  . ergocalciferol (VITAMIN D2) 50000 UNITS capsule Take 50,000 Units by mouth every Wednesday.     Marland Kitchen exenatide (BYETTA 10 MCG PEN) 10 MCG/0.04ML SOLN Inject 10 mcg into the skin 2 (two) times daily with a meal. bid    . Ferrous Sulfate (IRON) 325 (65 FE) MG TABS Take 1 tablet by mouth daily.    Marland Kitchen glimepiride (AMARYL) 4 MG tablet Take 2 mg by mouth  daily with breakfast.    . glucose blood (SMARTEST TEST) test strip 1 each by Other route 2 (two) times daily. Use as instructed    . insulin NPH (HUMULIN N,NOVOLIN N) 100 UNIT/ML injection Inject 5 Units into the skin at bedtime.     . isosorbide mononitrate (IMDUR) 30 MG 24 hr tablet Take 1 tablet (30 mg total) by mouth daily. 30 tablet 3  . losartan (COZAAR) 100 MG tablet Take 1 tablet (100 mg total) by mouth daily. 90 tablet 3  . metFORMIN (GLUCOPHAGE) 1000 MG tablet Take 500 mg by mouth 2 (two) times daily with a meal. Per pt Dr. Emeline General instructed to decrease dosage to 500 mg twice a day    . Multiple Vitamins-Minerals (CENTRUM SILVER ADULT 50+ PO) Take 1 tablet by mouth daily.    . pioglitazone (ACTOS) 30 MG tablet Take 30 mg by mouth daily.      Marland Kitchen spironolactone (ALDACTONE) 25 MG tablet take 1 tablet by mouth once daily 90 tablet 3  . vitamin B-12 (CYANOCOBALAMIN) 1000 MCG tablet Take 1,000 mcg by mouth daily.     No current facility-administered medications for this visit.     Past Medical History  Diagnosis Date  . CAD (coronary artery disease) 1998    stent post heart attack  . Hypertension   . Hyperlipidemia   . Myocardial infarction (HCC) 1998  .  PONV (postoperative nausea and vomiting)   . Shortness of breath     "occasionally; could happen at any time" (07/06/2012)  . DM type 2 (diabetes mellitus, type 2) (HCC)   . Iron deficiency anemia   . Migraines     "ages 17 thru 41; associated w/menstral cycle" (07/06/2012)  . Arthritis     "in my knees" (07/06/2012)    Past Surgical History  Procedure Laterality Date  . Tubal ligation    . Tonsillectomy  1952  . Coronary angioplasty with stent placement  1998    LAD/notes 07/07/2012  . Cardiac catheterization  07/06/2012  . Shoulder hemi-arthroplasty Right ?2009    "fell and crushed it" (07/06/2012)  . Shoulder arthroscopy Right 07/25/2007    exam under anesthesia; arthroscopic biceps tenotomy; partial labral tear  debridement; spur excision/notes 07/25/2007 (07/06/2012)  . Reduction mammaplasty    . Breast biopsy Right     "thought they saw something real tiny; it was a stitch left by OR when breast reduced" (07/06/2012)  . Coronary artery bypass graft N/A 07/11/2012    Procedure: CORONARY ARTERY BYPASS GRAFTING (CABG);  Surgeon: Loreli Slot, MD;  Location: [facility] OR;  Service: Open Heart Surgery;  Laterality: N/A;  x4, using left internal mammary and right greater saphenous vein.   . Implantable cardioverter defibrillator implant N/A 09/28/2013    Procedure: IMPLANTABLE CARDIOVERTER DEFIBRILLATOR IMPLANT;  Surgeon: Duke Salvia, MD;  Location: [facility] CATH LAB;  Service: Cardiovascular;  Laterality: N/A;    Social History   Social History  . Marital Status: Married    Spouse Name: N/A  . Number of Children: N/A  . Years of Education: N/A   Occupational History  . Not on file.   Social History Main Topics  . Smoking status: Never Smoker   . Smokeless tobacco: Never Used  . Alcohol Use: Yes     Comment: 07/06/2012 "glass of wine 1-2X/year"  . Drug Use: No  . Sexual Activity: Not Currently   Other Topics Concern  . Not on file   Social History Narrative    ROS: no fevers or chills, productive cough, hemoptysis, dysphasia, odynophagia, melena, hematochezia, dysuria, hematuria, rash, seizure activity, orthopnea, PND, pedal edema, claudication. Remaining systems are negative.  Physical Exam: Well-developed obese in no acute distress.  Skin is warm and dry.  HEENT is normal.  Neck is supple.  Chest is clear to auscultation with normal expansion.  Cardiovascular exam is regular rate and rhythm.  Abdominal exam nontender or distended. No masses palpated. Extremities show no edema. neuro grossly intact

## 2014-12-24 ENCOUNTER — Encounter: Payer: Self-pay | Admitting: Cardiology

## 2014-12-24 ENCOUNTER — Ambulatory Visit (INDEPENDENT_AMBULATORY_CARE_PROVIDER_SITE_OTHER): Payer: Medicare Other | Admitting: Cardiology

## 2014-12-24 VITALS — BP 152/60 | HR 72 | Ht 63.0 in | Wt 204.6 lb

## 2014-12-24 DIAGNOSIS — E785 Hyperlipidemia, unspecified: Secondary | ICD-10-CM

## 2014-12-24 MED ORDER — AMLODIPINE BESYLATE 5 MG PO TABS: 5.0000 mg | ORAL_TABLET | Freq: Every day | ORAL | Status: DC

## 2014-12-24 MED ORDER — ROSUVASTATIN CALCIUM 40 MG PO TABS: 40.0000 mg | ORAL_TABLET | Freq: Every day | ORAL | Status: DC

## 2014-12-24 NOTE — Assessment & Plan Note (Signed)
Followed by electrophysiology. 

## 2014-12-24 NOTE — Patient Instructions (Signed)
Medication Instructions:   STOP ISOSORBIDE  INCREASE ROUVASTATIN TO 40 MG ONCE DAILY= 4 OF THE 10 MG TABLETS ONCE DAILY  START AMLODIPINE 5 MG ONCE DAILY  Labwork:  Your physician recommends that you return for lab work in: 4 WEEKS= DO NOT EAT PRIOR TO LAB WORK  Follow-Up:  Your physician wants you to follow-up in: 6 MONTHS WITH DR Jens Som You will receive a reminder letter in the mail two months in advance. If you don't receive a letter, please call our office to schedule the follow-up appointment.   If you need a refill on your cardiac medications before your next appointment, please call your pharmacy.

## 2014-12-24 NOTE — Assessment & Plan Note (Signed)
Blood pressure is elevated. She is also complaining of headaches in the past 1 month. Discontinue Imdur. Add Norvasc 5 mg daily. Follow blood pressure and increase medications as needed.

## 2014-12-24 NOTE — Assessment & Plan Note (Signed)
Increase Crestor to 40 mg daily. Check lipids and liver in 4 weeks. 

## 2014-12-24 NOTE — Assessment & Plan Note (Signed)
Continue ARB, beta blocker and spironolactone. 

## 2014-12-24 NOTE — Assessment & Plan Note (Signed)
Continue aspirin and statin. 

## 2015-01-24 ENCOUNTER — Ambulatory Visit (INDEPENDENT_AMBULATORY_CARE_PROVIDER_SITE_OTHER): Payer: Medicare Other | Admitting: *Deleted

## 2015-01-24 DIAGNOSIS — I255 Ischemic cardiomyopathy: Secondary | ICD-10-CM | POA: Diagnosis not present

## 2015-01-25 NOTE — Progress Notes (Signed)
Remote ICD transmission.   

## 2015-02-04 LAB — CUP PACEART REMOTE DEVICE CHECK
Battery Voltage: 3.01 V
Date Time Interrogation Session: 20161201072824
HighPow Impedance: 53 Ohm
Implantable Lead Implant Date: 20150806
Implantable Lead Location: 753860
Lead Channel Pacing Threshold Pulse Width: 0.4 ms
Lead Channel Sensing Intrinsic Amplitude: 16.5 mV
Lead Channel Setting Pacing Amplitude: 2.5 V
Lead Channel Setting Pacing Pulse Width: 0.4 ms
MDC IDC MSMT BATTERY REMAINING LONGEVITY: 131 mo
MDC IDC MSMT LEADCHNL RV IMPEDANCE VALUE: 304 Ohm
MDC IDC MSMT LEADCHNL RV IMPEDANCE VALUE: 361 Ohm
MDC IDC MSMT LEADCHNL RV PACING THRESHOLD AMPLITUDE: 0.75 V
MDC IDC MSMT LEADCHNL RV SENSING INTR AMPL: 16.5 mV
MDC IDC SET LEADCHNL RV SENSING SENSITIVITY: 0.3 mV
MDC IDC STAT BRADY RV PERCENT PACED: 0.19 %

## 2015-02-05 ENCOUNTER — Encounter: Payer: Self-pay | Admitting: Cardiology

## 2015-04-02 ENCOUNTER — Encounter: Payer: Self-pay | Admitting: *Deleted

## 2015-04-03 ENCOUNTER — Encounter: Payer: Self-pay | Admitting: Diagnostic Neuroimaging

## 2015-04-03 ENCOUNTER — Ambulatory Visit (INDEPENDENT_AMBULATORY_CARE_PROVIDER_SITE_OTHER): Payer: Medicare Other | Admitting: Diagnostic Neuroimaging

## 2015-04-03 VITALS — BP 155/79 | HR 70 | Ht 63.0 in | Wt 206.4 lb

## 2015-04-03 DIAGNOSIS — F0390 Unspecified dementia without behavioral disturbance: Secondary | ICD-10-CM | POA: Diagnosis not present

## 2015-04-03 DIAGNOSIS — F03B0 Unspecified dementia, moderate, without behavioral disturbance, psychotic disturbance, mood disturbance, and anxiety: Secondary | ICD-10-CM

## 2015-04-03 NOTE — Progress Notes (Signed)
[facility]  PATIENT: Ashlee [name] DOB: Jan 04, 1941  REFERRING CLINICIAN: Altheimer, M  HISTORY FROM: patient and husband  REASON FOR VISIT: new consult     HISTORICAL  CHIEF COMPLAINT:  Chief Complaint  Patient presents with  . Progressive congnitive decline    rm 7, New patient, husbandMathis Fare,  MMSE 15    HISTORY OF PRESENT ILLNESS:   75 year old female here for evaluation of confusion memory loss. For past 2 months patient has had increasing short-term memory problems, word finding difficulties, losing things, increased paranoia. 6 months ago she significant on driving at the request of patient's husband. She still able to take care of her own personal activities of daily living including dressing, bathing, bathroom, light household chores. However referring physician also noted patient was having significant difficulty in her own medication management and communication. Patient denies any significant memory problems herself. These are mainly noticed by patient's husband.    REVIEW OF SYSTEMS: Full 14 system review of systems performed and notable only for restless legs headache hallucinations eye pain weight gain hearing loss.  ALLERGIES: Allergies  Allergen Reactions  . Demerol Nausea And Vomiting    Perfuse vomitting  . Percodan [Oxycodone-Aspirin] Nausea And Vomiting    Perfuse vomitting    HOME MEDICATIONS: Outpatient Prescriptions Prior to Visit  Medication Sig Dispense Refill  . amLODipine (NORVASC) 5 MG tablet Take 1 tablet (5 mg total) by mouth daily. 90 tablet 3  . aspirin 81 MG tablet Take 81 mg by mouth daily.    Marland Kitchen BEPREVE 1.5 % SOLN Place 1 drop into both eyes daily as needed (EYES).     . carvedilol (COREG) 12.5 MG tablet Take 1 tablet (12.5 mg total) by mouth 2 (two) times daily. 60 tablet 12  . diclofenac sodium (VOLTAREN) 1 % GEL Apply 2 g topically 4 (four) times daily. 100 Tube 1  . ergocalciferol (VITAMIN D2) 50000 UNITS  capsule Take 50,000 Units by mouth every Wednesday.     Marland Kitchen exenatide (BYETTA 10 MCG PEN) 10 MCG/0.04ML SOLN Inject 10 mcg into the skin 2 (two) times daily with a meal. bid    . Ferrous Sulfate (IRON) 325 (65 FE) MG TABS Take 1 tablet by mouth daily.    Marland Kitchen glimepiride (AMARYL) 4 MG tablet Take 2 mg by mouth daily with breakfast.    . glucose blood (SMARTEST TEST) test strip 1 each by Other route 2 (two) times daily. Use as instructed    . insulin NPH (HUMULIN N,NOVOLIN N) 100 UNIT/ML injection Inject 5 Units into the skin at bedtime.     Marland Kitchen losartan (COZAAR) 100 MG tablet Take 1 tablet (100 mg total) by mouth daily. 90 tablet 3  . metFORMIN (GLUCOPHAGE) 1000 MG tablet Take 500 mg by mouth 2 (two) times daily with a meal. Per pt Dr. Emeline General instructed to decrease dosage to 500 mg twice a day    . Multiple Vitamins-Minerals (CENTRUM SILVER ADULT 50+ PO) Take 1 tablet by mouth daily.    . pioglitazone (ACTOS) 30 MG tablet Take 30 mg by mouth daily.      . rosuvastatin (CRESTOR) 40 MG tablet Take 1 tablet (40 mg total) by mouth daily. 90 tablet 3  . spironolactone (ALDACTONE) 25 MG tablet take 1 tablet by mouth once daily 90 tablet 3  . vitamin B-12 (CYANOCOBALAMIN) 1000 MCG tablet Take 1,000 mcg by mouth daily.     No facility-administered medications prior to visit.    PAST  MEDICAL HISTORY: Past Medical History  Diagnosis Date  . CAD (coronary artery disease) 1998    stent post heart attack  . Hypertension   . Hyperlipidemia   . Myocardial infarction (HCC) 1998  . PONV (postoperative nausea and vomiting)   . Shortness of breath     "occasionally; could happen at any time" (07/06/2012)  . DM type 2 (diabetes mellitus, type 2) (HCC)   . Iron deficiency anemia   . Migraines     "ages 21 thru 73; associated w/menstral cycle" (07/06/2012)  . Arthritis     "in my knees" (07/06/2012)  . Memory loss   . HOH (hard of hearing)     PAST SURGICAL HISTORY: Past Surgical History  Procedure  Laterality Date  . Tubal ligation    . Tonsillectomy  1952  . Coronary angioplasty with stent placement  1998    LAD/notes 07/07/2012  . Cardiac catheterization  07/06/2012  . Shoulder hemi-arthroplasty Right ?2009    "fell and crushed it" (07/06/2012)  . Shoulder arthroscopy Right 07/25/2007    exam under anesthesia; arthroscopic biceps tenotomy; partial labral tear debridement; spur excision/notes 07/25/2007 (07/06/2012)  . Reduction mammaplasty    . Breast biopsy Right     "thought they saw something real tiny; it was a stitch left by OR when breast reduced" (07/06/2012)  . Coronary artery bypass graft N/A 07/11/2012    Procedure: CORONARY ARTERY BYPASS GRAFTING (CABG);  Surgeon: Loreli Slot, MD;  Location: [facility] OR;  Service: Open Heart Surgery;  Laterality: N/A;  x4, using left internal mammary and right greater saphenous vein.   . Implantable cardioverter defibrillator implant N/A 09/28/2013    Procedure: IMPLANTABLE CARDIOVERTER DEFIBRILLATOR IMPLANT;  Surgeon: Duke Salvia, MD;  Location: [facility] CATH LAB;  Service: Cardiovascular;  Laterality: N/A;    FAMILY HISTORY: Family History  Problem Relation Age of Onset  . Heart attack Mother     MI at age 35  . Breast cancer Sister   . Throat cancer Brother   . Lung cancer Brother   . Prostate cancer Father   . Hypertension Mother     SOCIAL HISTORY:  Social History   Social History  . Marital Status: Married    Spouse Name: [name]  . Number of Children: 0  . Years of Education: N/A   Occupational History  .      retired- [employer]   Social History Main Topics  . Smoking status: Never Smoker   . Smokeless tobacco: Never Used  . Alcohol Use: Yes     Comment: 07/06/2012 "glass of wine 1-2X/year"  . Drug Use: No  . Sexual Activity: Not Currently   Other Topics Concern  . Not on file   Social History Narrative   Lives at home with husband   Caffeine use-     PHYSICAL EXAM  GENERAL EXAM/CONSTITUTIONAL: Vitals:   Filed Vitals:   04/03/15 1046  BP: 155/79  Pulse: 70  Height:  (1.6 m)  Weight: 206 lb 6.4 oz (93.622 kg)     Body mass index is 36.57 kg/(m^2).  No exam data present  Patient is in no distress; well developed, nourished and groomed; neck is supple  CARDIOVASCULAR:  Examination of carotid arteries is normal; no carotid bruits  Regular rate and rhythm, no murmurs  Examination of peripheral vascular system by observation and palpation is normal  EYES:  Ophthalmoscopic exam of optic discs and posterior segments is normal; no papilledema or hemorrhages  MUSCULOSKELETAL:  Gait, strength, tone, movements noted in Neurologic exam below  NEUROLOGIC: MENTAL STATUS:  MMSE - Mini Mental State Exam 04/03/2015  Orientation to time 0  Orientation to Place 5  Registration 0  Attention/ Calculation 2  Recall 1  Language- name 2 objects 2  Language- repeat 0  Language- follow 3 step command 3  Language- follow 3 step command-comments had to repeat louder  Language- read & follow direction 1  Write a sentence 1  Copy design 0  Total score 15    awake, alert, oriented to person, place and time  DECR MEMORY  DECR ATTENTION  language fluent, DECR COMPREHENSION, naming intact,   fund of knowledge appropriate  LOQUACIOUS, TANGENTIAL, CIRCUMLOCUTION, POOR INSIGHT  CRANIAL NERVE:   2nd - no papilledema on fundoscopic exam  2nd, 3rd, 4th, 6th - pupils equal and reactive to light, visual fields full to confrontation, extraocular muscles intact, no nystagmus  5th - facial sensation symmetric  7th - facial strength symmetric  8th - hearing intact  9th - palate elevates symmetrically, uvula midline  11th - shoulder shrug symmetric  12th - tongue protrusion midline  MOTOR:   normal bulk and tone, full strength in the BUE, BLE  SENSORY:   normal and symmetric to light touch, temperature, vibration  COORDINATION:   finger-nose-finger, fine finger movements  normal  REFLEXES:   deep tendon reflexes TRACE and symmetric  POSITIVE SNOUT AND MYERSONS REFLEXES  GAIT/STATION:   narrow based gait; SHUFFLING GAIT; UNSTEADY GAIT    DIAGNOSTIC DATA (LABS, IMAGING, TESTING) - I reviewed patient records, labs, notes, testing and imaging myself where available.  Lab Results  Component Value Date   WBC 4.4 09/21/2013   HGB 10.5* 09/21/2013   HCT 32.6* 09/21/2013   MCV 86.1 09/21/2013   PLT 219.0 09/21/2013      Component Value Date/Time   NA 139 09/26/2014 1614   K 4.0 09/26/2014 1614   CL 106 09/26/2014 1614   CO2 23 09/26/2014 1614   GLUCOSE 161* 09/26/2014 1614   BUN 11 09/26/2014 1614   CREATININE 0.86 09/26/2014 1614   CREATININE 1.0 09/21/2013 1009   CALCIUM 9.2 09/26/2014 1614   PROT 7.2 12/21/2013 1045   ALBUMIN 4.2 12/21/2013 1045   AST 22 12/21/2013 1045   ALT 10 12/21/2013 1045   ALKPHOS 49 12/21/2013 1045   BILITOT 0.5 12/21/2013 1045   GFRNONAA 66 09/26/2014 1614   GFRNONAA 57* 07/16/2012 0610   GFRAA 76 09/26/2014 1614   GFRAA 66* 07/16/2012 0610   Lab Results  Component Value Date   CHOL 160 12/21/2013   HDL 73 12/21/2013   LDLCALC 79 12/21/2013   TRIG 42 12/21/2013   CHOLHDL 2.2 12/21/2013   Lab Results  Component Value Date   HGBA1C 6.3* 07/07/2012   No results found for: VITAMINB12 No results found for: TSH     ASSESSMENT AND PLAN  75 y.o. year old female here with progressive confusion, memory loss, language difficulty, poor insight, hearing loss. 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    Ddx: dementia vs stroke vs other CNS inflamm/structural/metabolic  Moderate dementia without behavioral disturbance    PLAN: - MRI brain - home health agency evalution - safety and supervision issues reviewed -->recommend NO DRIVING - asked husband to take over supervision of medication administration - may consider memantine or donepezil after home situation and supervision are improved  Orders Placed  This Encounter  Procedures  . MR Brain Wo Contrast  . Ambulatory referral  to Home Health   Return in about 3 months (around 07/01/2015).    Suanne Marker, MD 04/03/2015, 11:15 AM Certified in Neurology, Neurophysiology and Neuroimaging  [facility] [address] [place], [place] [address] [phone]

## 2015-04-03 NOTE — Patient Instructions (Addendum)
Thank you for coming to see [place] at [facility]. I hope we have been able to provide you high quality care today.  You may receive a patient satisfaction survey over the next few weeks. We would appreciate your feedback and comments so that we may continue to improve ourselves and the health of our patients.  - I will check MRI brain - home health evaluation - no driving   ~~~~~~~~~~~~~~~~~~~~~~~~~~~~~~~~~~~~~~~~~~~~~~~~~~~~~~~~~~~~~~~~~  DR. PENUMALLI'S GUIDE TO HAPPY AND HEALTHY LIVING These are some of my general health and wellness recommendations. Some of them may apply to you better than others. Please use common sense as you try these suggestions and feel free to ask me any questions.   ACTIVITY/FITNESS Mental, social, emotional and physical stimulation are very important for brain and body health. Try learning a new activity (arts, music, language, sports, games).  Keep moving your body to the best of your abilities.     NUTRITION Eat more plants: colorful vegetables, nuts, seeds and berries.  Eat less sugar, salt, preservatives and processed foods.  Avoid toxins such as cigarettes and alcohol.  Drink water when you are thirsty. Warm water with a slice of lemon is an excellent morning drink to start the day.  Consider these websites for more information The Nutrition Source (https://www.henry-hernandez.biz/) Precision Nutrition (WindowBlog.ch)   RELAXATION Consider practicing mindfulness meditation or other relaxation techniques such as deep breathing, prayer, yoga, tai chi, massage. See website mindful.org or the apps Headspace or Calm to help get started.   SLEEP Try to get at least 7-8+ hours sleep per day. Regular exercise and reduced caffeine will help you sleep better. Practice good sleep hygeine techniques. See website sleep.org for more information.   PLANNING Prepare estate planning, living will,  healthcare POA documents. Sometimes this is best planned with the help of an attorney. Theconversationproject.org and agingwithdignity.org are excellent resources.

## 2015-04-25 ENCOUNTER — Encounter: Payer: Self-pay | Admitting: Cardiology

## 2015-04-25 ENCOUNTER — Ambulatory Visit (INDEPENDENT_AMBULATORY_CARE_PROVIDER_SITE_OTHER): Payer: Medicare Other | Admitting: *Deleted

## 2015-04-25 DIAGNOSIS — I255 Ischemic cardiomyopathy: Secondary | ICD-10-CM | POA: Diagnosis not present

## 2015-04-25 NOTE — Progress Notes (Signed)
Remote ICD transmission.   

## 2015-04-29 ENCOUNTER — Other Ambulatory Visit: Payer: Medicare Other

## 2015-05-03 LAB — CUP PACEART REMOTE DEVICE CHECK
Battery Remaining Longevity: 129 mo
Date Time Interrogation Session: 20170302051603
HighPow Impedance: 55 Ohm
Implantable Lead Location: 753860
Lead Channel Pacing Threshold Amplitude: 0.625 V
Lead Channel Pacing Threshold Pulse Width: 0.4 ms
Lead Channel Sensing Intrinsic Amplitude: 16.75 mV
Lead Channel Setting Pacing Amplitude: 2.5 V
Lead Channel Setting Pacing Pulse Width: 0.4 ms
Lead Channel Setting Sensing Sensitivity: 0.3 mV
MDC IDC LEAD IMPLANT DT: 20150806
MDC IDC MSMT BATTERY VOLTAGE: 3.01 V
MDC IDC MSMT LEADCHNL RV IMPEDANCE VALUE: 304 Ohm
MDC IDC MSMT LEADCHNL RV IMPEDANCE VALUE: 399 Ohm
MDC IDC MSMT LEADCHNL RV SENSING INTR AMPL: 16.75 mV
MDC IDC STAT BRADY RV PERCENT PACED: 0.02 %

## 2015-05-03 NOTE — Progress Notes (Signed)
Normal remote reviewed.  Next [facility] 07/25/15 

## 2015-05-08 ENCOUNTER — Encounter: Payer: Self-pay | Admitting: Cardiology

## 2015-05-23 ENCOUNTER — Encounter: Payer: Self-pay | Admitting: Diagnostic Neuroimaging

## 2015-06-04 ENCOUNTER — Telehealth: Payer: Self-pay | Admitting: Neurology

## 2015-07-08 ENCOUNTER — Ambulatory Visit: Payer: Medicare Other | Admitting: Diagnostic Neuroimaging

## 2015-07-16 ENCOUNTER — Telehealth: Payer: Self-pay | Admitting: *Deleted

## 2015-07-16 NOTE — Telephone Encounter (Signed)
LVM for husband requesting he call back to reschedule patient's follow up. Advised phone staff may schedule follow up. Left name, number.

## 2015-07-24 ENCOUNTER — Other Ambulatory Visit: Payer: Self-pay | Admitting: *Deleted

## 2015-07-24 DIAGNOSIS — I2510 Atherosclerotic heart disease of native coronary artery without angina pectoris: Secondary | ICD-10-CM

## 2015-07-24 DIAGNOSIS — I429 Cardiomyopathy, unspecified: Secondary | ICD-10-CM

## 2015-07-24 DIAGNOSIS — E785 Hyperlipidemia, unspecified: Secondary | ICD-10-CM

## 2015-07-24 DIAGNOSIS — I10 Essential (primary) hypertension: Secondary | ICD-10-CM

## 2015-07-24 MED ORDER — CARVEDILOL 12.5 MG PO TABS: 12.5000 mg | ORAL_TABLET | Freq: Two times a day (BID) | ORAL | Status: DC

## 2015-07-24 NOTE — Telephone Encounter (Signed)
LVM requesting husband call back and reschedule wife's FU with phone staff. Left name, number, repeated number.

## 2015-07-25 ENCOUNTER — Ambulatory Visit (INDEPENDENT_AMBULATORY_CARE_PROVIDER_SITE_OTHER): Payer: Medicare Other | Admitting: *Deleted

## 2015-07-25 DIAGNOSIS — I255 Ischemic cardiomyopathy: Secondary | ICD-10-CM | POA: Diagnosis not present

## 2015-07-26 NOTE — Progress Notes (Signed)
Remote ICD transmission.   

## 2015-08-08 LAB — CUP PACEART REMOTE DEVICE CHECK
Battery Voltage: 3 V
Brady Statistic RV Percent Paced: 0.01 %
HIGH POWER IMPEDANCE MEASURED VALUE: 57 Ohm
Implantable Lead Implant Date: 20150806
Lead Channel Impedance Value: 342 Ohm
Lead Channel Impedance Value: 418 Ohm
Lead Channel Sensing Intrinsic Amplitude: 17.875 mV
Lead Channel Sensing Intrinsic Amplitude: 17.875 mV
Lead Channel Setting Pacing Amplitude: 2.5 V
MDC IDC LEAD LOCATION: 753860
MDC IDC MSMT BATTERY REMAINING LONGEVITY: 127 mo
MDC IDC MSMT LEADCHNL RV PACING THRESHOLD AMPLITUDE: 0.75 V
MDC IDC MSMT LEADCHNL RV PACING THRESHOLD PULSEWIDTH: 0.4 ms
MDC IDC SESS DTM: 20170601041604
MDC IDC SET LEADCHNL RV PACING PULSEWIDTH: 0.4 ms
MDC IDC SET LEADCHNL RV SENSING SENSITIVITY: 0.3 mV

## 2015-08-14 ENCOUNTER — Encounter: Payer: Self-pay | Admitting: Cardiology

## 2015-10-26 ENCOUNTER — Emergency Department (HOSPITAL_COMMUNITY): Payer: Medicare Other

## 2015-10-26 ENCOUNTER — Emergency Department (HOSPITAL_BASED_OUTPATIENT_CLINIC_OR_DEPARTMENT_OTHER): Admit: 2015-10-26 | Discharge: 2015-10-26 | Disposition: A | Discharge status: Home / Self Care | Payer: Medicare Other

## 2015-10-26 ENCOUNTER — Inpatient Hospital Stay (HOSPITAL_COMMUNITY)
Admission: EM | Admit: 2015-10-26 | Discharge: 2015-10-30 | DRG: 292 | Disposition: A | Discharge status: Skilled Nursing Facility | Payer: Medicare Other | Attending: Internal Medicine | Admitting: Internal Medicine

## 2015-10-26 ENCOUNTER — Encounter (HOSPITAL_COMMUNITY): Payer: Self-pay | Admitting: Emergency Medicine

## 2015-10-26 DIAGNOSIS — Z794 Long term (current) use of insulin: Secondary | ICD-10-CM

## 2015-10-26 DIAGNOSIS — I5022 Chronic systolic (congestive) heart failure: Secondary | ICD-10-CM | POA: Diagnosis present

## 2015-10-26 DIAGNOSIS — H9190 Unspecified hearing loss, unspecified ear: Secondary | ICD-10-CM | POA: Diagnosis present

## 2015-10-26 DIAGNOSIS — F0390 Unspecified dementia without behavioral disturbance: Secondary | ICD-10-CM | POA: Diagnosis present

## 2015-10-26 DIAGNOSIS — IMO0001 Reserved for inherently not codable concepts without codable children: Secondary | ICD-10-CM

## 2015-10-26 DIAGNOSIS — Z789 Other specified health status: Secondary | ICD-10-CM | POA: Diagnosis not present

## 2015-10-26 DIAGNOSIS — Z951 Presence of aortocoronary bypass graft: Secondary | ICD-10-CM

## 2015-10-26 DIAGNOSIS — I10 Essential (primary) hypertension: Secondary | ICD-10-CM | POA: Diagnosis present

## 2015-10-26 DIAGNOSIS — T501X5A Adverse effect of loop [high-ceiling] diuretics, initial encounter: Secondary | ICD-10-CM | POA: Diagnosis not present

## 2015-10-26 DIAGNOSIS — I110 Hypertensive heart disease with heart failure: Secondary | ICD-10-CM | POA: Diagnosis not present

## 2015-10-26 DIAGNOSIS — I255 Ischemic cardiomyopathy: Secondary | ICD-10-CM | POA: Diagnosis present

## 2015-10-26 DIAGNOSIS — R7989 Other specified abnormal findings of blood chemistry: Secondary | ICD-10-CM | POA: Diagnosis present

## 2015-10-26 DIAGNOSIS — Z96611 Presence of right artificial shoulder joint: Secondary | ICD-10-CM | POA: Diagnosis present

## 2015-10-26 DIAGNOSIS — R609 Edema, unspecified: Secondary | ICD-10-CM

## 2015-10-26 DIAGNOSIS — Z515 Encounter for palliative care: Secondary | ICD-10-CM

## 2015-10-26 DIAGNOSIS — R600 Localized edema: Secondary | ICD-10-CM

## 2015-10-26 DIAGNOSIS — R52 Pain, unspecified: Secondary | ICD-10-CM

## 2015-10-26 DIAGNOSIS — I5023 Acute on chronic systolic (congestive) heart failure: Secondary | ICD-10-CM | POA: Diagnosis not present

## 2015-10-26 DIAGNOSIS — E872 Acidosis: Secondary | ICD-10-CM

## 2015-10-26 DIAGNOSIS — Z95810 Presence of automatic (implantable) cardiac defibrillator: Secondary | ICD-10-CM | POA: Diagnosis present

## 2015-10-26 DIAGNOSIS — N179 Acute kidney failure, unspecified: Secondary | ICD-10-CM | POA: Diagnosis not present

## 2015-10-26 DIAGNOSIS — F0391 Unspecified dementia with behavioral disturbance: Secondary | ICD-10-CM

## 2015-10-26 DIAGNOSIS — R410 Disorientation, unspecified: Secondary | ICD-10-CM | POA: Diagnosis present

## 2015-10-26 DIAGNOSIS — Z79899 Other long term (current) drug therapy: Secondary | ICD-10-CM

## 2015-10-26 DIAGNOSIS — M170 Bilateral primary osteoarthritis of knee: Secondary | ICD-10-CM | POA: Diagnosis present

## 2015-10-26 DIAGNOSIS — Z7189 Other specified counseling: Secondary | ICD-10-CM | POA: Diagnosis not present

## 2015-10-26 DIAGNOSIS — F03B0 Unspecified dementia, moderate, without behavioral disturbance, psychotic disturbance, mood disturbance, and anxiety: Secondary | ICD-10-CM | POA: Diagnosis present

## 2015-10-26 DIAGNOSIS — D509 Iron deficiency anemia, unspecified: Secondary | ICD-10-CM | POA: Diagnosis present

## 2015-10-26 DIAGNOSIS — R0600 Dyspnea, unspecified: Secondary | ICD-10-CM | POA: Diagnosis not present

## 2015-10-26 DIAGNOSIS — E119 Type 2 diabetes mellitus without complications: Secondary | ICD-10-CM

## 2015-10-26 DIAGNOSIS — M79609 Pain in unspecified limb: Secondary | ICD-10-CM | POA: Diagnosis not present

## 2015-10-26 DIAGNOSIS — I2510 Atherosclerotic heart disease of native coronary artery without angina pectoris: Secondary | ICD-10-CM | POA: Diagnosis not present

## 2015-10-26 DIAGNOSIS — I252 Old myocardial infarction: Secondary | ICD-10-CM

## 2015-10-26 DIAGNOSIS — T39395A Adverse effect of other nonsteroidal anti-inflammatory drugs [NSAID], initial encounter: Secondary | ICD-10-CM | POA: Diagnosis not present

## 2015-10-26 DIAGNOSIS — E1165 Type 2 diabetes mellitus with hyperglycemia: Secondary | ICD-10-CM | POA: Diagnosis present

## 2015-10-26 DIAGNOSIS — R778 Other specified abnormalities of plasma proteins: Secondary | ICD-10-CM | POA: Diagnosis present

## 2015-10-26 DIAGNOSIS — Z9114 Patient's other noncompliance with medication regimen: Secondary | ICD-10-CM

## 2015-10-26 DIAGNOSIS — Z955 Presence of coronary angioplasty implant and graft: Secondary | ICD-10-CM

## 2015-10-26 DIAGNOSIS — F03918 Unspecified dementia, unspecified severity, with other behavioral disturbance: Secondary | ICD-10-CM

## 2015-10-26 DIAGNOSIS — Z7982 Long term (current) use of aspirin: Secondary | ICD-10-CM

## 2015-10-26 DIAGNOSIS — Z6835 Body mass index (BMI) 35.0-35.9, adult: Secondary | ICD-10-CM

## 2015-10-26 DIAGNOSIS — Z66 Do not resuscitate: Secondary | ICD-10-CM | POA: Diagnosis not present

## 2015-10-26 DIAGNOSIS — E785 Hyperlipidemia, unspecified: Secondary | ICD-10-CM | POA: Diagnosis present

## 2015-10-26 HISTORY — DX: Unspecified dementia without behavioral disturbance: F03.90

## 2015-10-26 LAB — CBC WITH DIFFERENTIAL/PLATELET
Basophils Absolute: 0 10*3/uL (ref 0.0–0.1)
Basophils Relative: 0 %
Eosinophils Absolute: 0 10*3/uL (ref 0.0–0.7)
Eosinophils Relative: 0 %
HCT: 43.2 % (ref 36.0–46.0)
Hemoglobin: 13.9 g/dL (ref 12.0–15.0)
Lymphocytes Relative: 10 %
Lymphs Abs: 0.9 10*3/uL (ref 0.7–4.0)
MCH: 27 pg (ref 26.0–34.0)
MCHC: 32.2 g/dL (ref 30.0–36.0)
MCV: 84 fL (ref 78.0–100.0)
Monocytes Absolute: 1.1 10*3/uL — ABNORMAL HIGH (ref 0.1–1.0)
Monocytes Relative: 13 %
Neutro Abs: 7 10*3/uL (ref 1.7–7.7)
Neutrophils Relative %: 77 %
Platelets: 244 10*3/uL (ref 150–400)
RBC: 5.14 MIL/uL — ABNORMAL HIGH (ref 3.87–5.11)
RDW: 14.8 % (ref 11.5–15.5)
WBC: 9 10*3/uL (ref 4.0–10.5)

## 2015-10-26 LAB — COMPREHENSIVE METABOLIC PANEL
ALT: 12 U/L — ABNORMAL LOW (ref 14–54)
AST: 27 U/L (ref 15–41)
Albumin: 3.4 g/dL — ABNORMAL LOW (ref 3.5–5.0)
Alkaline Phosphatase: 75 U/L (ref 38–126)
Anion gap: 9 (ref 5–15)
BUN: 11 mg/dL (ref 6–20)
CO2: 23 mmol/L (ref 22–32)
Calcium: 9.6 mg/dL (ref 8.9–10.3)
Chloride: 103 mmol/L (ref 101–111)
Creatinine, Ser: 0.86 mg/dL (ref 0.44–1.00)
GFR calc Af Amer: >60 mL/min (ref >60)
GFR calc non Af Amer: >60 mL/min (ref >60)
Glucose, Bld: 269 mg/dL — ABNORMAL HIGH (ref 65–99)
Potassium: 3.9 mmol/L (ref 3.5–5.1)
Sodium: 135 mmol/L (ref 135–145)
Total Bilirubin: 0.9 mg/dL (ref 0.3–1.2)
Total Protein: 8.2 g/dL — ABNORMAL HIGH (ref 6.5–8.1)

## 2015-10-26 LAB — URINALYSIS, ROUTINE W REFLEX MICROSCOPIC
Glucose, UA: >1000 mg/dL — AB
KETONES UR: 40 mg/dL — AB
LEUKOCYTES UA: NEGATIVE
NITRITE: NEGATIVE
PROTEIN: 100 mg/dL — AB
Specific Gravity, Urine: 1.031 — ABNORMAL HIGH (ref 1.005–1.030)
pH: 6 (ref 5.0–8.0)

## 2015-10-26 LAB — URINE MICROSCOPIC-ADD ON

## 2015-10-26 LAB — BRAIN NATRIURETIC PEPTIDE: B Natriuretic Peptide: 505.5 pg/mL — ABNORMAL HIGH (ref 0.0–100.0)

## 2015-10-26 LAB — I-STAT CG4 LACTIC ACID, ED
Lactic Acid, Venous: 2.11 mmol/L (ref 0.5–1.9)
Lactic Acid, Venous: 2.11 mmol/L (ref 0.5–1.9)

## 2015-10-26 LAB — I-STAT TROPONIN, ED: TROPONIN I, POC: 0.04 ng/mL (ref 0.00–0.08)

## 2015-10-26 LAB — LIPASE, BLOOD: Lipase: 24 U/L (ref 11–51)

## 2015-10-26 LAB — CBG MONITORING, ED: Glucose-Capillary: 242 mg/dL — ABNORMAL HIGH (ref 65–99)

## 2015-10-26 LAB — GLUCOSE, CAPILLARY: Glucose-Capillary: 275 mg/dL — ABNORMAL HIGH (ref 65–99)

## 2015-10-26 MED ORDER — INSULIN ASPART 100 UNIT/ML ~~LOC~~ SOLN
0.0000 [IU] | Freq: Three times a day (TID) | SUBCUTANEOUS | Status: DC
  Administered 2015-10-27 (×2): 5 [IU] via SUBCUTANEOUS
  Administered 2015-10-27: 7 [IU] via SUBCUTANEOUS
  Administered 2015-10-28: 5 [IU] via SUBCUTANEOUS
  Administered 2015-10-28: 9 [IU] via SUBCUTANEOUS
  Administered 2015-10-28: 7 [IU] via SUBCUTANEOUS
  Administered 2015-10-29: 5 [IU] via SUBCUTANEOUS
  Administered 2015-10-29 (×2): 7 [IU] via SUBCUTANEOUS

## 2015-10-26 MED ORDER — DEXTROSE 5 % IV SOLN
1.0000 g | Freq: Once | INTRAVENOUS | Status: AC
  Administered 2015-10-26: 1 g via INTRAVENOUS
  Filled 2015-10-26: qty 10

## 2015-10-26 MED ORDER — ONDANSETRON HCL 4 MG/2ML IJ SOLN: 4.0000 mg | Freq: Four times a day (QID) | INTRAMUSCULAR | Status: DC | PRN

## 2015-10-26 MED ORDER — INSULIN ASPART 100 UNIT/ML ~~LOC~~ SOLN
0.0000 [IU] | Freq: Every day | SUBCUTANEOUS | Status: DC
  Administered 2015-10-26: 3 [IU] via SUBCUTANEOUS

## 2015-10-26 MED ORDER — ADULT MULTIVITAMIN W/MINERALS CH
1.0000 | ORAL_TABLET | Freq: Every day | ORAL | Status: DC
  Administered 2015-10-27 – 2015-10-30 (×4): 1 via ORAL
  Filled 2015-10-26 (×4): qty 1

## 2015-10-26 MED ORDER — ROSUVASTATIN CALCIUM 10 MG PO TABS
40.0000 mg | ORAL_TABLET | Freq: Every day | ORAL | Status: DC
  Administered 2015-10-27 – 2015-10-30 (×4): 40 mg via ORAL
  Filled 2015-10-26 (×5): qty 4

## 2015-10-26 MED ORDER — SODIUM CHLORIDE 0.9% FLUSH
3.0000 mL | Freq: Two times a day (BID) | INTRAVENOUS | Status: DC
  Administered 2015-10-26 – 2015-10-29 (×7): 3 mL via INTRAVENOUS

## 2015-10-26 MED ORDER — HEPARIN SODIUM (PORCINE) 5000 UNIT/ML IJ SOLN
5000.0000 [IU] | Freq: Three times a day (TID) | INTRAMUSCULAR | Status: DC
  Administered 2015-10-26 – 2015-10-30 (×11): 5000 [IU] via SUBCUTANEOUS
  Filled 2015-10-26 (×11): qty 1

## 2015-10-26 MED ORDER — FUROSEMIDE 10 MG/ML IJ SOLN
20.0000 mg | Freq: Two times a day (BID) | INTRAMUSCULAR | Status: DC
  Administered 2015-10-26 – 2015-10-28 (×5): 20 mg via INTRAVENOUS
  Filled 2015-10-26 (×5): qty 2

## 2015-10-26 MED ORDER — VITAMIN B-12 1000 MCG PO TABS
1000.0000 ug | ORAL_TABLET | Freq: Every day | ORAL | Status: DC
  Administered 2015-10-27 – 2015-10-29 (×3): 1000 ug via ORAL
  Filled 2015-10-26 (×5): qty 1

## 2015-10-26 MED ORDER — SODIUM CHLORIDE 0.9 % IV SOLN
250.0000 mL | INTRAVENOUS | Status: DC | PRN
  Administered 2015-10-28: 250 mL via INTRAVENOUS

## 2015-10-26 MED ORDER — INSULIN ASPART 100 UNIT/ML ~~LOC~~ SOLN: 0.0000 [IU] | Freq: Three times a day (TID) | SUBCUTANEOUS | Status: DC

## 2015-10-26 MED ORDER — VITAMIN D (ERGOCALCIFEROL) 1.25 MG (50000 UNIT) PO CAPS
50000.0000 [IU] | ORAL_CAPSULE | ORAL | Status: DC
  Filled 2015-10-26: qty 1

## 2015-10-26 MED ORDER — CARVEDILOL 3.125 MG PO TABS
3.1250 mg | ORAL_TABLET | Freq: Two times a day (BID) | ORAL | Status: DC
  Administered 2015-10-27 – 2015-10-30 (×7): 3.125 mg via ORAL
  Filled 2015-10-26 (×8): qty 1

## 2015-10-26 MED ORDER — OLOPATADINE HCL 0.1 % OP SOLN
1.0000 [drp] | Freq: Two times a day (BID) | OPHTHALMIC | Status: DC
  Administered 2015-10-26 – 2015-10-30 (×8): 1 [drp] via OPHTHALMIC
  Filled 2015-10-26: qty 5

## 2015-10-26 MED ORDER — DICLOFENAC SODIUM 1 % TD GEL
2.0000 g | Freq: Four times a day (QID) | TRANSDERMAL | Status: DC
  Administered 2015-10-26 – 2015-10-30 (×14): 2 g via TOPICAL
  Filled 2015-10-26: qty 100

## 2015-10-26 MED ORDER — LOSARTAN POTASSIUM 25 MG PO TABS
25.0000 mg | ORAL_TABLET | Freq: Every day | ORAL | Status: DC
  Administered 2015-10-27 – 2015-10-30 (×4): 25 mg via ORAL
  Filled 2015-10-26 (×4): qty 1

## 2015-10-26 MED ORDER — ASPIRIN EC 81 MG PO TBEC: 81.0000 mg | DELAYED_RELEASE_TABLET | Freq: Every day | ORAL | Status: DC

## 2015-10-26 MED ORDER — SODIUM CHLORIDE 0.9 % IV BOLUS (SEPSIS)
500.0000 mL | Freq: Once | INTRAVENOUS | Status: AC
  Administered 2015-10-26: 500 mL via INTRAVENOUS

## 2015-10-26 MED ORDER — ACETAMINOPHEN 325 MG PO TABS
650.0000 mg | ORAL_TABLET | ORAL | Status: DC | PRN
  Administered 2015-10-27 (×2): 650 mg via ORAL
  Filled 2015-10-26 (×2): qty 2

## 2015-10-26 MED ORDER — SODIUM CHLORIDE 0.9% FLUSH: 3.0000 mL | INTRAVENOUS | Status: DC | PRN

## 2015-10-26 NOTE — ED Notes (Signed)
I Stat Lactic results shown to Dr. Shela Commons. Long

## 2015-10-26 NOTE — ED Provider Notes (Signed)
Emergency Department Provider Note   I have reviewed the triage vital signs and the nursing notes.   HISTORY  Chief Complaint Leg Swelling   HPI Ashlee [name] is a 75 y.o. female with PMH of CAD, DM, HLD, HTN presents to the ED with bilateral LE edema and pain. The patient's husband reports the patient is at her mental status baseline but had right lower extremity swelling that he noticed yesterday. He tried Epsom salts without relief and swelling. Has understood any redness or fever. The patient is had confusion for several months and is seeing neurology as an outpatient. They're unsure if this is due to the patient's dementia. States her mental status not worsen significantly. The patient began complaining of chest pain or difficulty breathing. No prior history of DVT.   Past Medical History:  Diagnosis Date  . Arthritis    "in my knees" (07/06/2012)  . CAD (coronary artery disease) 1998   stent post heart attack  . DM type 2 (diabetes mellitus, type 2) (HCC)   . HOH (hard of hearing)   . Hyperlipidemia   . Hypertension   . Iron deficiency anemia   . Memory loss   . Migraines    "ages 68 thru 79; associated w/menstral cycle" (07/06/2012)  . Myocardial infarction (HCC) 1998  . PONV (postoperative nausea and vomiting)   . Shortness of breath    "occasionally; could happen at any time" (07/06/2012)    Patient Active Problem List   Diagnosis Date Noted  . Edema 10/26/2015  . 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 Ischemic cardiomyopathy 09/28/2013  . Diabetes mellitus (HCC) 08/10/2012  . Cardiomyopathy, ischemic 06/30/2012  . Chest pain 06/24/2010  . CAD (coronary artery disease) 06/24/2010  . Hypertension 06/24/2010  . Hyperlipidemia 06/24/2010  . Murmur 06/24/2010  . Bruit 06/24/2010    Past Surgical History:  Procedure Laterality Date  . BREAST BIOPSY Right    "thought they saw something real tiny; it was a stitch left by OR when  breast reduced" (07/06/2012)  . CARDIAC CATHETERIZATION  07/06/2012  . CORONARY ANGIOPLASTY WITH STENT PLACEMENT  1998   LAD/notes 07/07/2012  . CORONARY ARTERY BYPASS GRAFT N/A 07/11/2012   Procedure: CORONARY ARTERY BYPASS GRAFTING (CABG);  Surgeon: Loreli Slot, MD;  Location: [facility] OR;  Service: Open Heart Surgery;  Laterality: N/A;  x4, using left internal mammary and right greater saphenous vein.   . IMPLANTABLE CARDIOVERTER DEFIBRILLATOR IMPLANT N/A 09/28/2013   Procedure: IMPLANTABLE CARDIOVERTER DEFIBRILLATOR IMPLANT;  Surgeon: Duke Salvia, MD;  Location: [facility] CATH LAB;  Service: Cardiovascular;  Laterality: N/A;  . REDUCTION MAMMAPLASTY    . SHOULDER ARTHROSCOPY Right 07/25/2007   exam under anesthesia; arthroscopic biceps tenotomy; partial labral tear debridement; spur excision/notes 07/25/2007 (07/06/2012)  . SHOULDER HEMI-ARTHROPLASTY Right ?2009   "fell and crushed it" (07/06/2012)  . TONSILLECTOMY  1952  . TUBAL LIGATION      Current Outpatient Rx  . Order #: [id number] Class: Historical Med  . Order #: [id number] Class: Historical Med  . Order #: [id number] Class: Historical Med  . Order #: [id number] Class: Historical Med  . Order #: [id number] Class: Normal  . Order #: [id number] Class: Historical Med  . Order #: [id number] Class: Normal  . Order #: [id number] Class: Historical Med  . Order #: [id number] Class: Historical Med  . Order #: [id number] Class: Normal  . Order #: [id number] Class: Normal  . Order #: [id number] Class: Historical Med  . Order #: [id number] Class: Historical Med  . Order #: [id number] Class: Historical  Med  . Order #: [id number] Class: Historical Med  . Order #: [id number] Class: Normal  . Order #: [id number] Class: Historical Med  . Order #: [id number] Class: Normal    Allergies Demerol and Percodan [oxycodone-aspirin]  Family History  Problem Relation Age of Onset  . Prostate cancer Father   . Heart attack Mother     MI at age 77  . Hypertension Mother   . Breast cancer Sister    . Throat cancer Brother   . Lung cancer Brother     Social History Social History  Substance Use Topics  . Smoking status: Never Smoker  . Smokeless tobacco: Never Used  . Alcohol use Yes     Comment: 07/06/2012 "glass of wine 1-2X/year"    Review of Systems  Patient baseline confusion and unable to provide significant ROS.   ____________________________________________   PHYSICAL EXAM:  VITAL SIGNS: ED Triage Vitals  Enc Vitals Group     BP 10/26/15 1437 157/86     Pulse Rate 10/26/15 1437 82     Resp 10/26/15 1437 20     Temp 10/26/15 1437 99.2 F (37.3 C)     Temp Source 10/26/15 1437 Oral     SpO2 10/26/15 1437 100 %     Pain Score 10/26/15 1429 4   Constitutional: Alert and oriented. Well appearing and in no acute distress. Eyes: Conjunctivae are normal.  Head: Atraumatic. Nose: No congestion/rhinnorhea. Mouth/Throat: Mucous membranes are moist.  Oropharynx non-erythematous. Neck: No stridor.  Cardiovascular: Normal rate, regular rhythm. Good peripheral circulation. Grossly normal heart sounds.   Respiratory: Normal respiratory effort.  No retractions. Lungs CTAB. Gastrointestinal: Soft and nontender. No distention.  Musculoskeletal: No lower extremity tenderness. Right lower extremity swelling. No erythema. No gross deformities of extremities. Neurologic:  Normal speech and language. No gross focal neurologic deficits are appreciated.  Skin:  Skin is warm, dry and intact. No rash noted. Psychiatric: Mood and affect are normal. Speech and behavior are normal.  ____________________________________________   LABS (all labs ordered are listed, but only abnormal results are displayed)  Labs Reviewed  COMPREHENSIVE METABOLIC PANEL - Abnormal; Notable for the following:       Result Value   Glucose, Bld 269 (*)    Total Protein 8.2 (*)    Albumin 3.4 (*)    ALT 12 (*)    All other components within normal limits  BRAIN NATRIURETIC PEPTIDE - Abnormal;  Notable for the following:    B Natriuretic Peptide 505.5 (*)    All other components within normal limits  CBC WITH DIFFERENTIAL/PLATELET - Abnormal; Notable for the following:    RBC 5.14 (*)    Monocytes Absolute 1.1 (*)    All other components within normal limits  URINALYSIS, ROUTINE W REFLEX MICROSCOPIC (NOT AT [facility]) - Abnormal; Notable for the following:    Color, Urine AMBER (*)    APPearance CLOUDY (*)    Specific Gravity, Urine 1.031 (*)    Glucose, UA >1000 (*)    Hgb urine dipstick SMALL (*)    Bilirubin Urine SMALL (*)    Ketones, ur 40 (*)    Protein, ur 100 (*)    All other components within normal limits  URINE MICROSCOPIC-ADD ON - Abnormal; Notable for the following:    Squamous Epithelial / LPF 0-5 (*)    Bacteria, UA FEW (*)    All other components within normal limits  CBG MONITORING, ED - Abnormal; Notable for the following:    Glucose-Capillary 242 (*)  All other components within normal limits  I-STAT CG4 LACTIC ACID, ED - Abnormal; Notable for the following:    Lactic Acid, Venous 2.11 (*)    All other components within normal limits  I-STAT CG4 LACTIC ACID, ED - Abnormal; Notable for the following:    Lactic Acid, Venous 2.11 (*)    All other components within normal limits  URINE CULTURE  LIPASE, BLOOD  I-STAT TROPOININ, ED   ____________________________________________  RADIOLOGY  Dg Chest 2 View  Result Date: 10/26/2015 CLINICAL DATA:  Altered mental status.  Coronary artery disease. EXAM: CHEST  2 VIEW COMPARISON:  09/29/2013 FINDINGS: Heart size is stable. Aortic atherosclerosis. AICD remains in appropriate position. Prior CABG again noted. There left basilar scarring and elevation of left hemidiaphragm are stable. No evidence of acute infiltrate or pulmonary edema. No evidence of pleural effusion or pneumothorax. IMPRESSION: Stable left basilar scarring.  No acute findings. Electronically Signed   By: Myles Rosenthal M.D.   On: 10/26/2015 16:51     ____________________________________________   PROCEDURES  Procedure(s) performed:   Procedures  None ____________________________________________   INITIAL IMPRESSION / ASSESSMENT AND PLAN / ED COURSE  Pertinent labs & imaging results that were available during my care of the patient were reviewed by me and considered in my medical decision making (see chart for details).  Patient presents emergency permit for evaluation of right lower extremity swelling. Patient has slight confusion thought to be secondary to dementia. No fevers. Right lower extremity is slightly more swollen than the left. Patient with unremarkable vital signs. Plantar labs including Urine and lower external ultrasound.   Elevated lactate on labs. [place] pending  07:36 PM Patient with elevated BNP and lower extremity edema. Patient is unable to walk with elevated lactate. Urinalysis with no leuk esterase or nitrite. I am treating with many bacteria and symptoms of possible worsening confusion. Have paged the internal medicine team for admission.  08:02 PM Discussed patient's case with hospitalist, Dr. Antionette Char.  Recommend admission to observation, telemetry bed.  I will place holding orders per their request. Patient and family (if present) updated with plan. Care transferred to hospitalist service.  I reviewed all nursing notes, vitals, pertinent old records, EKGs, labs, imaging (as available).  ____________________________________________  FINAL CLINICAL IMPRESSION(S) / ED DIAGNOSES  Final diagnoses:  Peripheral edema  Confusion  Dyspnea     MEDICATIONS GIVEN DURING THIS VISIT:  Medications  sodium chloride 0.9 % bolus 500 mL (0 mLs Intravenous Stopped 10/26/15 1839)  cefTRIAXone (ROCEPHIN) 1 g in dextrose 5 % 50 mL IVPB (0 g Intravenous Stopped 10/26/15 2025)     NEW OUTPATIENT MEDICATIONS STARTED DURING THIS VISIT:  None   Note:  This document was prepared using Dragon voice recognition software  and may include unintentional dictation errors.  Alona Bene, MD Emergency Medicine   Maia Plan, MD 10/26/15 2028

## 2015-10-26 NOTE — Progress Notes (Signed)
VASCULAR LAB PRELIMINARY  PRELIMINARY  PRELIMINARY  PRELIMINARY  Right lower extremity venous duplex completed.    Preliminary report:  There is no DVT or SVT noted in the right lower extremity.   Sukhman Martine, RVT 10/26/2015, 4:20 PM

## 2015-10-26 NOTE — H&P (Signed)
History and Physical    Ashlee [name] AOZ:[id number] DOB: Mar 29, 1940 DOA: 10/26/2015  PCP: Junious Silk, MD   Patient coming from: Home   Chief Complaint: Leg swelling, worsening confusion   HPI: Ashlee [name] is a 75 y.o. female with medical history significant for chronic systolic CHF, coronary artery disease with stent, insulin-dependent diabetes mellitus, hypertension, and memory loss with confusion followed by neurology in the outpatient setting who presents to the emergency department with worsening confusion and bilateral lower extremity edema. History is obtained through discussion with the ED personnel, review of the EMR, and discussion with the patient's husband and first cousin at bedside. Patient significantly confused and unable to contribute to the history. Patient reportedly began developing confusion and memory loss towards the end of 2016 and has been under the evaluation of her neurologist. MRI brain was ordered back in February for evaluation of this, but the patient has reportedly refused to have this study performed. Given the worsening confusion and memory problems, the neurologist had come to an agreement with the patient's husband that he would take over her medication management. Unfortunately, the patient has not taken any of her medications since that time, and per the report of the patient's cousin at the bedside, her husband seems to have given up on her. Patient is no longer taking her insulin or any of her medications. She reportedly developed swelling in the bilateral lower extremities yesterday. She is also reported to have increasing confusion, but this seems to be a slow and progressive worsening over the past 10 months or so. There has been no acute change in her mental status.  ED Course: Upon arrival to the ED, patient is found to be afebrile, saturating well on room air, and with vital signs stable. EKG demonstrates sinus rhythm with LVH by voltage criteria  and secondary polarization abnormality with associated ST changes in the anterior leads. Chest x-ray is negative for acute cardiopulmonary disease. Chemistry panel is notable for a serum glucose of 269 and CBC is unremarkable. Troponin is within the normal limits and BNP is elevated to 505. Lactic acid is mildly elevated to 0.11 and remains 2.11 on repeat 3 hours later. Urinalysis was obtained, it is notable for greater than 1000 glucose, 40 ketones, small hemoglobin, and 100 protein. There is also an elevation in specific gravity, but the UA is not suggestive of infection. Patient was given an empiric dose of Rocephin for a suspected UTI and a 500 cc normal saline bolus in the ED. She has remained hemodynamically stable and in no acute respiratory distress. She'll be observed on telemetry unit for ongoing evaluation and management of acute on chronic systolic CHF in the setting of medical noncompliance.  Review of Systems:  Unable to obtain ROS secondary to the patient's clinical condition with confusion.  Past Medical History:  Diagnosis Date  . Arthritis    "in my knees" (07/06/2012)  . CAD (coronary artery disease) 1998   stent post heart attack  . DM type 2 (diabetes mellitus, type 2) (HCC)   . HOH (hard of hearing)   . Hyperlipidemia   . Hypertension   . Iron deficiency anemia   . Memory loss   . Migraines    "ages 21 thru 3; associated w/menstral cycle" (07/06/2012)  . Myocardial infarction (HCC) 1998  . PONV (postoperative nausea and vomiting)   . Shortness of breath    "occasionally; could happen at any time" (07/06/2012)    Past Surgical History:  Procedure Laterality Date  . BREAST BIOPSY Right    "thought they saw something real tiny; it was a stitch left by OR when breast reduced" (07/06/2012)  . CARDIAC CATHETERIZATION  07/06/2012  . CORONARY ANGIOPLASTY WITH STENT PLACEMENT  1998   LAD/notes 07/07/2012  . CORONARY ARTERY BYPASS GRAFT N/A 07/11/2012   Procedure: CORONARY  ARTERY BYPASS GRAFTING (CABG);  Surgeon: Loreli Slot, MD;  Location: [facility] OR;  Service: Open Heart Surgery;  Laterality: N/A;  x4, using left internal mammary and right greater saphenous vein.   . IMPLANTABLE CARDIOVERTER DEFIBRILLATOR IMPLANT N/A 09/28/2013   Procedure: IMPLANTABLE CARDIOVERTER DEFIBRILLATOR IMPLANT;  Surgeon: Duke Salvia, MD;  Location: [facility] CATH LAB;  Service: Cardiovascular;  Laterality: N/A;  . REDUCTION MAMMAPLASTY    . SHOULDER ARTHROSCOPY Right 07/25/2007   exam under anesthesia; arthroscopic biceps tenotomy; partial labral tear debridement; spur excision/notes 07/25/2007 (07/06/2012)  . SHOULDER HEMI-ARTHROPLASTY Right ?2009   "fell and crushed it" (07/06/2012)  . TONSILLECTOMY  1952  . TUBAL LIGATION       reports that she has never smoked. She has never used smokeless tobacco. She reports that she drinks alcohol. She reports that she does not use drugs.  Allergies  Allergen Reactions  . Demerol Nausea And Vomiting    Perfuse vomitting  . Percodan [Oxycodone-Aspirin] Nausea And Vomiting    Perfuse vomitting    Family History  Problem Relation Age of Onset  . Prostate cancer Father   . Heart attack Mother     MI at age 10  . Hypertension Mother   . Breast cancer Sister   . Throat cancer Brother   . Lung cancer Brother      Prior to Admission medications   Medication Sig Start Date End Date Taking? Authorizing Provider  ergocalciferol (VITAMIN D2) 50000 UNITS capsule Take 50,000 Units by mouth every Wednesday.  11/20/10  Yes Historical Provider, MD  insulin NPH (HUMULIN N,NOVOLIN N) 100 UNIT/ML injection Inject 5 Units into the skin at bedtime.    Yes Historical Provider, MD  metFORMIN (GLUCOPHAGE) 1000 MG tablet Take 500 mg by mouth 2 (two) times daily with a meal. Per pt Dr. Emeline General instructed to decrease dosage to 500 mg twice a day   Yes Historical Provider, MD  pioglitazone (ACTOS) 30 MG tablet Take 30 mg by mouth daily.     Yes Historical  Provider, MD  rosuvastatin (CRESTOR) 40 MG tablet Take 1 tablet (40 mg total) by mouth daily. 12/24/14  Yes Lewayne Bunting, MD  vitamin B-12 (CYANOCOBALAMIN) 1000 MCG tablet Take 1,000 mcg by mouth daily.   Yes Historical Provider, MD  amLODipine (NORVASC) 5 MG tablet Take 1 tablet (5 mg total) by mouth daily. 12/24/14   Lewayne Bunting, MD  aspirin 81 MG tablet Take 81 mg by mouth daily.    Historical Provider, MD  BEPREVE 1.5 % SOLN Place 1 drop into both eyes daily as needed (EYES).  11/01/13   Historical Provider, MD  carvedilol (COREG) 12.5 MG tablet Take 1 tablet (12.5 mg total) by mouth 2 (two) times daily. 07/24/15   Lewayne Bunting, MD  diclofenac sodium (VOLTAREN) 1 % GEL Apply 2 g topically 4 (four) times daily. 11/22/13   Richard Asencion Gowda, DPM  exenatide (BYETTA 10 MCG PEN) 10 MCG/0.04ML SOLN Inject 10 mcg into the skin 2 (two) times daily with a meal. bid    Historical Provider, MD  Ferrous Sulfate (IRON) 325 (65 FE) MG TABS Take  1 tablet by mouth daily.    Historical Provider, MD  glimepiride (AMARYL) 4 MG tablet Take 2 mg by mouth daily with breakfast.    Historical Provider, MD  glucose blood (SMARTEST TEST) test strip 1 each by Other route 2 (two) times daily. Use as instructed    Historical Provider, MD  losartan (COZAAR) 100 MG tablet Take 1 tablet (100 mg total) by mouth daily. 09/17/14   Lewayne BuntingBrian S Crenshaw, MD  Multiple Vitamins-Minerals (CENTRUM SILVER ADULT 50+ PO) Take 1 tablet by mouth daily.    Historical Provider, MD  spironolactone (ALDACTONE) 25 MG tablet take 1 tablet by mouth once daily 12/22/13   Lewayne BuntingBrian S Crenshaw, MD    Physical Exam: Vitals:   10/26/15 1815 10/26/15 1931 10/26/15 2000 10/26/15 2116  BP: 118/97 164/72 153/60 (!) 167/70  Pulse: 98 91 89 94  Resp: 19 11 17 18   Temp:    99.3 F (37.4 C)  TempSrc:    Axillary  SpO2: 98% 98% 100% 100%      Constitutional: NAD, calm, comfortable Eyes: PERTLA, lids and conjunctivae normal ENMT: Mucous membranes  are moist. Posterior pharynx clear of any exudate or lesions.   Neck: normal, supple, no masses, no thyromegaly Respiratory: clear to auscultation bilaterally, no wheezing, no crackles. No accessory muscle use.  Cardiovascular: S1 & S2 heard, regular rate and rhythm, soft systolic murmur at LSB. 2+ pretibial edema on right, 1+ on left. 2+ pedal pulses. Moderate neck vein distension. Abdomen: No distension, no tenderness, no masses palpated. Bowel sounds normal.  Musculoskeletal: no clubbing / cyanosis. No joint deformity upper and lower extremities. Normal muscle tone.  Skin: no significant rashes, lesions, ulcers. Warm, dry, well-perfused. Neurologic: CN 2-12 grossly intact. Sensation intact, DTR normal. Strength 5/5 in all 4 limbs.  Psychiatric: Alert, oriented to person only. Confused, slightly anxious.     Labs on Admission: I have personally reviewed following labs and imaging studies  CBC:  Recent Labs Lab 10/26/15 1555  WBC 9.0  NEUTROABS 7.0  HGB 13.9  HCT 43.2  MCV 84.0  PLT 244   Basic Metabolic Panel:  Recent Labs Lab 10/26/15 1555  NA 135  K 3.9  CL 103  CO2 23  GLUCOSE 269*  BUN 11  CREATININE 0.86  CALCIUM 9.6   GFR: CrCl cannot be calculated (Unknown ideal weight.). Liver Function Tests:  Recent Labs Lab 10/26/15 1555  AST 27  ALT 12*  ALKPHOS 75  BILITOT 0.9  PROT 8.2*  ALBUMIN 3.4*    Recent Labs Lab 10/26/15 1555  LIPASE 24   No results for input(s): AMMONIA in the last 168 hours. Coagulation Profile: No results for input(s): INR, PROTIME in the last 168 hours. Cardiac Enzymes: No results for input(s): CKTOTAL, CKMB, CKMBINDEX, TROPONINI in the last 168 hours. BNP (last 3 results) No results for input(s): PROBNP in the last 8760 hours. HbA1C: No results for input(s): HGBA1C in the last 72 hours. CBG:  Recent Labs Lab 10/26/15 1430  GLUCAP 242*   Lipid Profile: No results for input(s): CHOL, HDL, LDLCALC, TRIG, CHOLHDL,  LDLDIRECT in the last 72 hours. Thyroid Function Tests: No results for input(s): TSH, T4TOTAL, FREET4, T3FREE, THYROIDAB in the last 72 hours. Anemia Panel: No results for input(s): VITAMINB12, FOLATE, FERRITIN, TIBC, IRON, RETICCTPCT in the last 72 hours. Urine analysis:    Component Value Date/Time   COLORURINE AMBER (A) 10/26/2015 1833   APPEARANCEUR CLOUDY (A) 10/26/2015 1833   LABSPEC 1.031 (H) 10/26/2015 1833  PHURINE 6.0 10/26/2015 1833   GLUCOSEU >1000 (A) 10/26/2015 1833   HGBUR SMALL (A) 10/26/2015 1833   BILIRUBINUR SMALL (A) 10/26/2015 1833   KETONESUR 40 (A) 10/26/2015 1833   PROTEINUR 100 (A) 10/26/2015 1833   UROBILINOGEN 1.0 07/09/2012 2227   NITRITE NEGATIVE 10/26/2015 1833   LEUKOCYTESUR NEGATIVE 10/26/2015 1833   Sepsis Labs: @LABRCNTIP (procalcitonin:4,lacticidven:4) )No results found for this or any previous visit (from the past 240 hour(s)).   Radiological Exams on Admission: Dg Chest 2 View  Result Date: 10/26/2015 CLINICAL DATA:  Altered mental status.  Coronary artery disease. EXAM: CHEST  2 VIEW COMPARISON:  09/29/2013 FINDINGS: Heart size is stable. Aortic atherosclerosis. AICD remains in appropriate position. Prior CABG again noted. There left basilar scarring and elevation of left hemidiaphragm are stable. No evidence of acute infiltrate or pulmonary edema. No evidence of pleural effusion or pneumothorax. IMPRESSION: Stable left basilar scarring.  No acute findings. Electronically Signed   By: Myles Rosenthal M.D.   On: 10/26/2015 16:51    EKG: Independently reviewed. Sinus rhythm, LVH by voltage criteria with secondary repolarization abnormality and associated anterior ST changes  Assessment/Plan  1. Acute on chronic systolic CHF  - Presents with peripheral edema, BNP 505, and some mild respiratory distress  - Right leg swelling more pronounced than left; prelim read on venous doppler is no DVT  - TTE (08/01/2013) with EF 30%, diffuse HK, mild LVH, mild TR   - Had previously been on Coreg, losartan, and Aldactone before she stopped taking her medications >6 mos ago (see discussion below under "Confusion, memory loss")  - Suspect this is secondary to non-adherence to treatment plan; rule-out acute MI as cause with serial troponins and EKG's - Monitor on telemetry, diurese with Lasix 20 mg IV q12h  - Follow daily BMP - Update TTE, ordered   2. Insulin-dependent DM  - A1c 6.3% in 2014  - She had been managed with glimepiride, Byetta, Actos, and Humilin 5 units qHS until she stopped her medications   - Serum glucose 269 on admission with >1k urine glucose  - Check CBG with meals and qHS - Start with a low-intensity sliding-scale correctional only and adjust prn  - Update A1c, ordered    3. Hypertension  - Mildly elevated on admission  - Previously managed with Norvasc, Coreg, losartan, and Aldactone  - Will resume Coreg and losartan at low doses; IV Lasix as above    4. Confusion, memory loss  - Has been worsening since late 2016 and she has been under the care of outpatient neurology  - She is suspected to have dementia and neurologist was planning to start some medications after obtaining MRI brain - Patient has reportedly refused the MRI  - As confusion and memory problems began worsening, pt's husband agreed to not let her drive and take over her medication management; unfortunately, he hasn't given her any medications since, explaining that it "didn't seem like she needs them"  - Palliative and social work consultations requested to assist with goals of care discussion and the ethical issues surrounding this    5. CAD - Underwent 4v CABG in 2014  - No anginal complaints; initial troponin wnl  - EKG with ST-changes likely secondary to LVH  - Monitor on telemetry and follow serial troponin  - Repeat EKG in am    6. Elevated lactic acid  - Lactate mildly elevated to 2.11 on admission, remaining 2.11 three hrs later after a 500 cc NS  bolus  was given  - No fever, leukocytosis, or apparent focus of infection  - Suspect this may be from acute CHF and resulting decrease in CO; hemodynamics may improve with diuresis, improving tissue perfusion and lowering lactic acid  - She received a dose of Rocephin in ED for a suspected UTI, but UA is not suggestive of infection and this will be stopped for now - Trend lactate, culture if febrile      DVT prophylaxis: sq Lovenox  Code Status: Full; husband does not want to discuss at time of admission, citing recent death of his sister; palliative consulted for assistance  Family Communication: Husband and cousin updated at bedside Disposition Plan: Observe on telemetry Consults called: None Admission status: Observation    Briscoe Deutscher, MD [facility] Pager [phone]  If 7PM-7AM, please contact night-coverage [email or web address] Password [facility]  10/26/2015, 9:32 PM

## 2015-10-26 NOTE — ED Triage Notes (Signed)
Pt here with new leg swelling onset yesterday. Edema noted to bilateral legs, worse on right. Pt also c/o pain to top of left foot and pain upon palpation to right foot. Pt has dementia and is at baseline per family.

## 2015-10-27 ENCOUNTER — Observation Stay (HOSPITAL_COMMUNITY): Payer: Medicare Other

## 2015-10-27 ENCOUNTER — Observation Stay (HOSPITAL_BASED_OUTPATIENT_CLINIC_OR_DEPARTMENT_OTHER): Payer: Medicare Other

## 2015-10-27 DIAGNOSIS — I509 Heart failure, unspecified: Secondary | ICD-10-CM

## 2015-10-27 DIAGNOSIS — Z515 Encounter for palliative care: Secondary | ICD-10-CM | POA: Diagnosis not present

## 2015-10-27 DIAGNOSIS — R0600 Dyspnea, unspecified: Secondary | ICD-10-CM | POA: Diagnosis present

## 2015-10-27 DIAGNOSIS — I5023 Acute on chronic systolic (congestive) heart failure: Secondary | ICD-10-CM | POA: Diagnosis not present

## 2015-10-27 DIAGNOSIS — I2510 Atherosclerotic heart disease of native coronary artery without angina pectoris: Secondary | ICD-10-CM | POA: Diagnosis not present

## 2015-10-27 DIAGNOSIS — R410 Disorientation, unspecified: Secondary | ICD-10-CM | POA: Diagnosis not present

## 2015-10-27 LAB — TROPONIN I
Troponin I: 0.06 ng/mL (ref <0.03)
Troponin I: 0.06 ng/mL (ref <0.03)
Troponin I: 0.06 ng/mL (ref <0.03)

## 2015-10-27 LAB — ECHOCARDIOGRAM COMPLETE
HEIGHTINCHES: 64 in
Weight: 3308.66 oz

## 2015-10-27 LAB — BASIC METABOLIC PANEL
Anion gap: 12 (ref 5–15)
BUN: 11 mg/dL (ref 6–20)
CHLORIDE: 103 mmol/L (ref 101–111)
CO2: 21 mmol/L — ABNORMAL LOW (ref 22–32)
Calcium: 8.9 mg/dL (ref 8.9–10.3)
Creatinine, Ser: 0.89 mg/dL (ref 0.44–1.00)
GFR calc Af Amer: >60 mL/min (ref >60)
GFR calc non Af Amer: >60 mL/min (ref >60)
GLUCOSE: 267 mg/dL — AB (ref 65–99)
POTASSIUM: 3.4 mmol/L — AB (ref 3.5–5.1)
Sodium: 136 mmol/L (ref 135–145)

## 2015-10-27 LAB — MAGNESIUM: Magnesium: 2 mg/dL (ref 1.7–2.4)

## 2015-10-27 LAB — GLUCOSE, CAPILLARY
GLUCOSE-CAPILLARY: 275 mg/dL — AB (ref 65–99)
GLUCOSE-CAPILLARY: 269 mg/dL — AB (ref 65–99)
Glucose-Capillary: 280 mg/dL — ABNORMAL HIGH (ref 65–99)
Glucose-Capillary: 311 mg/dL — ABNORMAL HIGH (ref 65–99)

## 2015-10-27 LAB — LACTIC ACID, PLASMA
LACTIC ACID, VENOUS: 1 mmol/L (ref 0.5–1.9)
Lactic Acid, Venous: 1.2 mmol/L (ref 0.5–1.9)

## 2015-10-27 LAB — HEMOGLOBIN A1C
Hgb A1c MFr Bld: 9.9 % — ABNORMAL HIGH (ref 4.8–5.6)
Mean Plasma Glucose: 237 mg/dL

## 2015-10-27 MED ORDER — PERFLUTREN LIPID MICROSPHERE
1.0000 mL | INTRAVENOUS | Status: AC | PRN
  Administered 2015-10-27: 2 mL via INTRAVENOUS
  Filled 2015-10-27: qty 10

## 2015-10-27 MED ORDER — PERFLUTREN LIPID MICROSPHERE
INTRAVENOUS | Status: AC
  Filled 2015-10-27: qty 10

## 2015-10-27 MED ORDER — INSULIN GLARGINE 100 UNIT/ML ~~LOC~~ SOLN
12.0000 [IU] | Freq: Every day | SUBCUTANEOUS | Status: DC
  Administered 2015-10-27: 12 [IU] via SUBCUTANEOUS
  Filled 2015-10-27 (×2): qty 0.12

## 2015-10-27 MED ORDER — ASPIRIN 81 MG PO CHEW
81.0000 mg | CHEWABLE_TABLET | Freq: Every day | ORAL | Status: DC
  Administered 2015-10-27 – 2015-10-30 (×4): 81 mg via ORAL
  Filled 2015-10-27 (×4): qty 1

## 2015-10-27 MED ORDER — DEXTROSE 5 % IV SOLN
1.0000 g | INTRAVENOUS | Status: DC
  Administered 2015-10-27 – 2015-10-28 (×2): 1 g via INTRAVENOUS
  Filled 2015-10-27 (×3): qty 10

## 2015-10-27 MED ORDER — TRAMADOL HCL 50 MG PO TABS
25.0000 mg | ORAL_TABLET | Freq: Four times a day (QID) | ORAL | Status: DC | PRN
  Administered 2015-10-27 – 2015-10-28 (×2): 25 mg via ORAL
  Filled 2015-10-27 (×2): qty 1

## 2015-10-27 MED ORDER — POTASSIUM CHLORIDE CRYS ER 20 MEQ PO TBCR
40.0000 meq | EXTENDED_RELEASE_TABLET | Freq: Four times a day (QID) | ORAL | Status: AC
  Administered 2015-10-27 (×2): 40 meq via ORAL
  Filled 2015-10-27 (×2): qty 2

## 2015-10-27 NOTE — Progress Notes (Signed)
PROGRESS NOTE  Ashlee [name]  YNW:[id number] DOB: 03/15/1940 DOA: 10/26/2015 PCP: Junious SilkALTHEIMER,MICHAEL D, MD Outpatient Specialists:  Subjective: Patient is confused, disoriented likely she has worsening dementia.  Brief Narrative:  Ashlee [name] is a 75 y.o. female with medical history significant for chronic systolic CHF, coronary artery disease with stent, insulin-dependent diabetes mellitus, hypertension, and memory loss with confusion followed by neurology in the outpatient setting who presents to the emergency department with worsening confusion and bilateral lower extremity edema. History is obtained through discussion with the ED personnel, review of the EMR, and discussion with the patient's husband and first cousin at bedside. Patient significantly confused and unable to contribute to the history. Patient reportedly began developing confusion and memory loss towards the end of 2016 and has been under the evaluation of her neurologist. MRI brain was ordered back in February for evaluation of this, but the patient has reportedly refused to have this study performed. Given the worsening confusion and memory problems, the neurologist had come to an agreement with the patient's husband that he would take over her medication management. Unfortunately, the patient has not taken any of her medications since that time, and per the report of the patient's cousin at the bedside, her husband seems to have given up on her. Patient is no longer taking her insulin or any of her medications. She reportedly developed swelling in the bilateral lower extremities yesterday. She is also reported to have increasing confusion, but this seems to be a slow and progressive worsening over the past 10 months or so. There has been no acute change in her mental status.  Assessment & Plan:   Principal Problem:   Acute on chronic systolic CHF (congestive heart failure) (HCC) Active Problems:   CAD (coronary artery  disease)   Hypertension   Insulin dependent diabetes mellitus (HCC)   ICD (implantable cardioverter-defibrillator) in place   Chronic confusion   Elevated lactic acid level   Dyspnea   Acute on chronic systolic CHF  - Presents with peripheral edema, BNP 505, and some mild respiratory distress  - Right leg swelling more pronounced than left; prelim read on venous doppler is no DVT  - TTE (08/01/2013) with EF 30%, diffuse HK, mild LVH, mild TR  - Had previously been on Coreg, losartan, and Aldactone before she stopped taking her medications >6 mos ago (see discussion below under "Confusion, memory loss")  - Suspect this is secondary to non-adherence to treatment plan; rule-out acute MI as cause with serial troponins and EKG's - Monitor on telemetry, diurese with Lasix 20 mg IV q12h  - Elevate lower extremity, daily weight, restrict intake and output.  Elevated troponin -Slightly elevated troponin at 0.06, this is likely secondary to CHF, 2-D echo to be done. -Flat curve does not suggest this yes.  Insulin-dependent DM  - A1c 6.3% in 2014  - She had been managed with glimepiride, Byetta, Actos, and Humilin 5 units qHS until she stopped her medications   - Check A1c, SSI and carbohydrate modified diet.  Hypertension  - Mildly elevated on admission  - Previously managed with Norvasc, Coreg, losartan, and Aldactone  - Will resume Coreg and losartan at low doses; IV Lasix as above    Confusion, memory loss  - Has been worsening since late 2016 and she has been under the care of outpatient neurology  - She is suspected to have dementia and neurologist was planning to start some medications after obtaining MRI brain - Patient has reportedly  refused the MRI  - She is high risk for sundowning.  CAD - Underwent 4v CABG in 2014  - No anginal complaints; initial troponin wnl  - EKG with ST-changes likely secondary to LVH  - Monitor on telemetry and follow serial troponin  - Repeat EKG  in am    Elevated lactic acid  - Lactate mildly elevated to 2.11 on admission, remaining 2.11 three hrs later after a 500 cc NS bolus was given  - Could be lower perfusion cause of CHF.      DVT prophylaxis:  Code Status: Full Code Family Communication:  Disposition Plan:  Diet: Diet heart healthy/carb modified Room service appropriate? Yes; Fluid consistency: Thin; Fluid restriction: 1500 mL Fluid  Consultants:   None  Procedures:   None  Antimicrobials:   None  Objective: Vitals:   10/26/15 2116 10/26/15 2121 10/27/15 0533 10/27/15 0635  BP: (!) 167/70  132/61 139/65  Pulse: 94  86 89  Resp: 18  18   Temp: 99.3 F (37.4 C)  98.3 F (36.8 C)   TempSrc: Axillary  Oral   SpO2: 100%  96%   Weight:  93.8 kg (206 lb 12.7 oz)    Height:  5\' 4"  (1.626 m)      Intake/Output Summary (Last 24 hours) at 10/27/15 0943 Last data filed at 10/27/15 0900  Gross per 24 hour  Intake              360 ml  Output                0 ml  Net              360 ml   Filed Weights   10/26/15 2121  Weight: 93.8 kg (206 lb 12.7 oz)    Examination: General exam: Appears calm and comfortable  Respiratory system: Clear to auscultation. Respiratory effort normal. Cardiovascular system: S1 & S2 heard, RRR. No JVD, murmurs, rubs, gallops or clicks. No pedal edema. Gastrointestinal system: Abdomen is nondistended, soft and nontender. No organomegaly or masses felt. Normal bowel sounds heard. Central nervous system: Alert and oriented. No focal neurological deficits. Extremities: Symmetric 5 x 5 power. Skin: No rashes, lesions or ulcers Psychiatry: Judgement and insight appear normal. Mood & affect appropriate.   Data Reviewed: I have personally reviewed following labs and imaging studies  CBC:  Recent Labs Lab 10/26/15 1555  WBC 9.0  NEUTROABS 7.0  HGB 13.9  HCT 43.2  MCV 84.0  PLT 244   Basic Metabolic Panel:  Recent Labs Lab 10/26/15 1555 10/27/15 0701  NA 135 136   K 3.9 3.4*  CL 103 103  CO2 23 21*  GLUCOSE 269* 267*  BUN 11 11  CREATININE 0.86 0.89  CALCIUM 9.6 8.9  MG  --  2.0   GFR: Estimated Creatinine Clearance: 58.7 mL/min (by C-G formula based on SCr of 0.89 mg/dL). Liver Function Tests:  Recent Labs Lab 10/26/15 1555  AST 27  ALT 12*  ALKPHOS 75  BILITOT 0.9  PROT 8.2*  ALBUMIN 3.4*    Recent Labs Lab 10/26/15 1555  LIPASE 24   No results for input(s): AMMONIA in the last 168 hours. Coagulation Profile: No results for input(s): INR, PROTIME in the last 168 hours. Cardiac Enzymes:  Recent Labs Lab 10/27/15 0110 10/27/15 0701  TROPONINI 0.06* 0.06*   BNP (last 3 results) No results for input(s): PROBNP in the last 8760 hours. HbA1C: No results for input(s): HGBA1C in the  last 72 hours. CBG:  Recent Labs Lab 10/26/15 1430 10/26/15 2214 10/27/15 0627  GLUCAP 242* 275* 280*   Lipid Profile: No results for input(s): CHOL, HDL, LDLCALC, TRIG, CHOLHDL, LDLDIRECT in the last 72 hours. Thyroid Function Tests: No results for input(s): TSH, T4TOTAL, FREET4, T3FREE, THYROIDAB in the last 72 hours. Anemia Panel: No results for input(s): VITAMINB12, FOLATE, FERRITIN, TIBC, IRON, RETICCTPCT in the last 72 hours. Urine analysis:    Component Value Date/Time   COLORURINE AMBER (A) 10/26/2015 1833   APPEARANCEUR CLOUDY (A) 10/26/2015 1833   LABSPEC 1.031 (H) 10/26/2015 1833   PHURINE 6.0 10/26/2015 1833   GLUCOSEU >1000 (A) 10/26/2015 1833   HGBUR SMALL (A) 10/26/2015 1833   BILIRUBINUR SMALL (A) 10/26/2015 1833   KETONESUR 40 (A) 10/26/2015 1833   PROTEINUR 100 (A) 10/26/2015 1833   UROBILINOGEN 1.0 07/09/2012 2227   NITRITE NEGATIVE 10/26/2015 1833   LEUKOCYTESUR NEGATIVE 10/26/2015 1833   Sepsis Labs: @LABRCNTIP (procalcitonin:4,lacticidven:4)  )No results found for this or any previous visit (from the past 240 hour(s)).   Invalid input(s): PROCALCITONIN, LACTICACIDVEN   Radiology Studies: Dg Chest 2  View  Result Date: 10/26/2015 CLINICAL DATA:  Altered mental status.  Coronary artery disease. EXAM: CHEST  2 VIEW COMPARISON:  09/29/2013 FINDINGS: Heart size is stable. Aortic atherosclerosis. AICD remains in appropriate position. Prior CABG again noted. There left basilar scarring and elevation of left hemidiaphragm are stable. No evidence of acute infiltrate or pulmonary edema. No evidence of pleural effusion or pneumothorax. IMPRESSION: Stable left basilar scarring.  No acute findings. Electronically Signed   By: Myles Rosenthal M.D.   On: 10/26/2015 16:51        Scheduled Meds: . aspirin EC  81 mg Oral Daily  . carvedilol  3.125 mg Oral BID WC  . diclofenac sodium  2 g Topical QID  . furosemide  20 mg Intravenous BID  . heparin  5,000 Units Subcutaneous Q8H  . insulin aspart  0-5 Units Subcutaneous QHS  . insulin aspart  0-9 Units Subcutaneous TID WC  . losartan  25 mg Oral Daily  . multivitamin with minerals  1 tablet Oral Daily  . olopatadine  1 drop Both Eyes BID  . rosuvastatin  40 mg Oral Daily  . sodium chloride flush  3 mL Intravenous Q12H  . vitamin B-12  1,000 mcg Oral Daily  . [START ON 10/30/2015] Vitamin D (Ergocalciferol)  50,000 Units Oral Q Wed   Continuous Infusions:    LOS: 0 days    Time spent: 35 minutes    Atziri Zubiate A, MD [facility] Pager [phone]  If 7PM-7AM, please contact night-coverage [email or web address] Password [facility] 10/27/2015, 9:43 AM

## 2015-10-27 NOTE — Progress Notes (Signed)
  Echocardiogram 2D Echocardiogram with Definity has been performed.  Tye Savoy 10/27/2015, 12:05 PM

## 2015-10-27 NOTE — Progress Notes (Signed)
4-person assist to change pt's linens. Pt grimacing at any touch to R or L leg. Pt unwilling to move either leg. Dr. Arthor Captain made aware - ordered CT of R and L hip. Per Dr. Arthor Captain MRI of head refused previously so will not order at this time.   Pt family at bedside. Very concerned with change from patient baseline. Pt normally feeds, dresses, toilets independently. Pt currently requiring assistance to feed/dress and is incontinent. Updated family as to plan of care regarding medications and CT.   Storm Frisk [place]

## 2015-10-27 NOTE — Evaluation (Signed)
Physical Therapy Evaluation Patient Details Name: Carollee Leitzhyllis H Schraeder MRN: [id number] DOB: 02/22/1941 Today's Date: 10/27/2015   History of Present Illness  Patient is a 75 yo female admitted 10/26/15 with LE edema and increased confusion.  Patient with acute on chronic CHF.   PMH:  CHF, CAD, MI, CABG, ICD, DM, HTN, memory loss/confusion, HOH, medical noncomliance (see chart)    Clinical Impression  Patient with problems listed below.  Will benefit from acute PT to maximize functional mobility prior to discharge.  Per patient's husband, patient was ambulatory as of 10/25/15.  Today, patient unable to actively move LE's, or tolerate passive movement of LE's due to pain, Lt > Rt.  Concerned about position of LLE - appears shorter than RLE.  RN to room.  Provided my concerns and RN calling MD.  At this point, patient requiring +2 assist for any movement, so recommend SNF at d/c for continued therapy.      Follow Up Recommendations SNF;Supervision/Assistance - 24 hour    Equipment Recommendations  Other (comment) (TBD)    Recommendations for Other Services       Precautions / Restrictions Precautions Precautions: Fall Precaution Comments: Very confused Restrictions Weight Bearing Restrictions: No      Mobility  Bed Mobility               General bed mobility comments: Unable to attempt due to pain with passive movement with BLE's, Lt > Rt.  Transfers                    Ambulation/Gait                Stairs            Wheelchair Mobility    Modified Rankin (Stroke Patients Only)       Balance                                             Pertinent Vitals/Pain Pain Assessment: Faces Faces Pain Scale: Hurts whole lot Pain Location: LE's/hips with any passive movement Pain Descriptors / Indicators: Crying;Grimacing;Guarding Pain Intervention(s): Limited activity within patient's tolerance;Repositioned (RN made aware and contacting MD)     Home Living Family/patient expects to be discharged to:: Skilled nursing facility Living Arrangements: Spouse/significant other Available Help at Discharge: Family;Available 24 hours/day             Additional Comments: Patient unable to provide information    Prior Function           Comments: RN speaking with husband on phone.  Husband reports patient ambulatory with no assistive device until Friday (10/25/15).  He reports patient had increased swelling in legs.  Reports she did have a fall in July.   Per chart, husband providing medicine management due to decreased cognition.     Hand Dominance        Extremity/Trunk Assessment   Upper Extremity Assessment: Difficult to assess due to impaired cognition (Able to move UE's voluntarily against gravity)           Lower Extremity Assessment: RLE deficits/detail;LLE deficits/detail RLE Deficits / Details: Patient unable to move RLE to verbal/tactile commands.  Attempted to passively move RLE and patient cries out in pain, grabbing at leg. LLE Deficits / Details: No active movement with verbal and tactile cues.  Noted LLE appears shorter than RLE and  is externally rotated.  Attempted to passively move LLE and patient cries out with pain, Lt > Rt.  Patient reaching for RLE, saying "no, no"     Communication   Communication: No difficulties  Cognition Arousal/Alertness: Awake/alert Behavior During Therapy:  (Pleasantly confused.  Rambling conversation) Overall Cognitive Status: No family/caregiver present to determine baseline cognitive functioning Area of Impairment: Orientation;Attention;Memory;Following commands;Safety/judgement;Awareness;Problem solving Orientation Level: Disoriented to;Place;Time;Situation (Oriented to self only) Current Attention Level: Focused Memory: Decreased short-term memory Following Commands:  (Difficulty following one-step commands (< 10%)) Safety/Judgement: Decreased awareness of  deficits   Problem Solving: Slow processing;Difficulty sequencing;Requires tactile cues General Comments: Patient pleasant with significant confusion.  Unable to follow simple commands.  Patient talking in sentences, but not related to questions asked or situation at hand.    General Comments      Exercises        Assessment/Plan    PT Assessment Patient needs continued PT services  PT Diagnosis Difficulty walking;Generalized weakness;Acute pain;Altered mental status   PT Problem List Decreased strength;Decreased activity tolerance;Decreased balance;Decreased mobility;Decreased cognition;Decreased knowledge of use of DME;Pain  PT Treatment Interventions DME instruction;Gait training;Functional mobility training;Therapeutic activities;Patient/family education   PT Goals (Current goals can be found in the Care Plan section) Acute Rehab PT Goals Patient Stated Goal: Unable to state PT Goal Formulation: Patient unable to participate in goal setting Time For Goal Achievement: 11/10/15 Potential to Achieve Goals: Fair    Frequency Min 3X/week   Barriers to discharge        Co-evaluation               End of Session   Activity Tolerance: Patient limited by pain Patient left: in bed;with call bell/phone within reach;with bed alarm set;with nursing/sitter in room Nurse Communication: Mobility status (Pain with passive LE movement. LLE position. )    Functional Assessment Tool Used: Clinical judgement Functional Limitation: Mobility: Walking and moving around Mobility: Walking and Moving Around Current Status [phone]): At least 80 percent but less than 100 percent impaired, limited or restricted Mobility: Walking and Moving Around Goal Status [phone]): At least 40 percent but less than 60 percent impaired, limited or restricted    Time: 1043-1056 PT Time Calculation (min) (ACUTE ONLY): 13 min   Charges:   PT Evaluation $PT Eval Moderate Complexity: 1 Procedure     PT  G Codes:   PT G-Codes **NOT FOR INPATIENT CLASS** Functional Assessment Tool Used: Clinical judgement Functional Limitation: Mobility: Walking and moving around Mobility: Walking and Moving Around Current Status (F2924): At least 80 percent but less than 100 percent impaired, limited or restricted Mobility: Walking and Moving Around Goal Status [phone]): At least 40 percent but less than 60 percent impaired, limited or restricted    Vena Austria 10/27/2015, 12:10 PM Durenda Hurt. Renaldo Fiddler, [facility] Acute Rehab Services Pager [phone]

## 2015-10-27 NOTE — Progress Notes (Signed)
Called into pt room by PT. Pt unable to tolerate movement of R or L leg. Spoke with husband via telephone - per husband as of 9/1 pt was ambulatory, pt unable to ambulate r/t swelling in legs at home. Dr. Arthor Captain ordered xray of L hip and pelvis.   Leonidas Romberg, RN

## 2015-10-27 NOTE — Progress Notes (Signed)
CRITICAL VALUE ALERT  Critical value received:  Troponin 0.06  Date of notification:  10/27/15  Time of notification:  0200  Critical value read back:Yes.    Nurse who received alert:  Marcia Brash  MD notified (1st page):  MD Montez Morita (triad)   Time of first page:  0205    Responding MD:  MD Montez Morita  Time MD responded:  [phone]  Medical team aware no further orders at this time will continue to cycle troponin per order.

## 2015-10-27 NOTE — Consult Note (Signed)
Consultation Note Date: 10/27/2015   Patient Name: Ashlee [name]  DOB: 1940/05/03  MRN: [id number]  Age / Sex: 75 y.o., female  PCP: Lorne Skeens, MD Referring Physician: Verlee Monte, MD  Reason for Consultation: Establishing goals of care and Psychosocial/spiritual support  HPI/Patient Profile: 75 y.o. female  with past medical history of Severe cardiac disease going back to 1998 when she had an MI, she also underwent heart catheterization in 2014, EF at that time was 25%, CABG in 2014 with EF 30%, 2015 EF documented at 23% admitted on 10/26/2015 with increased lower extremity swelling, and increased confusion per her husband and first cousin. Per chart review patient has had memory loss and confusion for a while and has been followed by  neurology in the outpatient setting. At one point neurology recommended an MRI of the brain but this was not performed. Reportedly patient refused to have this done. Also noted that after discussion with neurologist husband agreed to take over medication management for patient but apparently she has not been taking any of her cardiac meds since that time (it is not clear to me how long she has been off of her medications). Patient is no longer taking her insulin and other oral medications. Chest x-ray was negative for any acute cardiopulmonary disease. Her vital signs were stable and she was afebrile saturating well on room air. Chemistry panels were unremarkable except for glucose was elevated at 269, and BNP was 505. Lactic acid was mildly elevated. Urinalysis was not suggestive of infection but she was given an empiric dose of Rocephin as well as fluid bolus in the emergency room..   Clinical Assessment and Goals of Care: Met with patient this morning and observed her eating breakfast. Patient has to be fed. CNA asked patient to hold the toast and she was able to take a bite if  that but then began to fold the toast. And put it back on the tray. There was no overt signs of aspiration. She can tell me her name. She does not know where she is the year the month. Thought processes are disorganized. She maintains good eye contact and attention.. She is only able to follow brief 2-3 step commands, for example please pick up the toast and take a bite;which she was able to do but then did not know what to do with the toast and began to fold it.  Upon speaking to husband, up until Friday she was able to walk with a walker, toilet herself and dress herself as well as feed herself. He does state that the verbal confusion that I see today has been going on for about 4-6 weeks. The last fall that he is aware of occurred 4-6 weeks ago but she did walk with a walker since that time. When I examined her in the bed and ask her to move either of her legs she seems unable to do that. When I take her socks off she begins to cry out and state don't touch me. She is  able to move her right foot up and down but she will not do that with her left. This morning during breakfast when the nursing assistant attempted to raise the head of the bed to help her eat she cried out in pain and grabbed her right hip. Her left leg is pulled up and her left foot abducted.   Husband, Cloee Dunwoody    SUMMARY OF RECOMMENDATIONS   Initial goal of this consultation was to establish rapport, illicit goals relevant to patient and family Husband I believe would be open to more detailed goals of care discussion after he obtains further workup as to what feels like to him a very acute decline. Husband is also hopeful for either a CT scan or an MRI of the brain during this hospitalization. This was recommended by neurology and an outpatient basis as mentioned under initial admission note No change to current plan of care Palliative medicine team to stay involved Code Status/Advance Care Planning:  Full code    Symptom  Management:   Pain: She denies pain but appeared to be in pain to her right hip with movement. Cont with Tylenol PRN for now  Palliative Prophylaxis:   Aspiration, Bowel Regimen, Frequent Pain Assessment, Oral Care and Turn Reposition  Additional Recommendations (Limitations, Scope, Preferences):  Full Scope Treatment  Psycho-social/Spiritual:   Desire for further Chaplaincy support:no   Prognosis:   Unable to determine  Discharge Planning: To Be Determined      Primary Diagnoses: Present on Admission: . Acute on chronic systolic CHF (congestive heart failure) ([place]) . CAD (coronary artery disease) . Hypertension . ICD (implantable cardioverter-defibrillator) in place . Chronic confusion . Elevated lactic acid level   I have reviewed the medical record, interviewed the patient and family, and examined the patient. The following aspects are pertinent.  Past Medical History:  Diagnosis Date  . Arthritis    "in my knees" (07/06/2012)  . CAD (coronary artery disease) 1998   stent post heart attack  . DM type 2 (diabetes mellitus, type 2) ([place])   . HOH (hard of hearing)   . Hyperlipidemia   . Hypertension   . Iron deficiency anemia   . Memory loss   . Migraines    "ages 1 thru 39; associated w/menstral cycle" (07/06/2012)  . Myocardial infarction ([place]) 1998  . PONV (postoperative nausea and vomiting)   . Shortness of breath    "occasionally; could happen at any time" (07/06/2012)   Social History   Social History  . Marital status: Married    Spouse name: Gwenlyn Perking  . Number of children: 0  . Years of education: N/A   Occupational History  .      retired- [employer]   Social History Main Topics  . Smoking status: Never Smoker  . Smokeless tobacco: Never Used  . Alcohol use Yes     Comment: 07/06/2012 "glass of wine 1-2X/year"  . Drug use: No  . Sexual activity: Not Currently   Other Topics Concern  . None   Social History Narrative   Lives at  home with husband   Caffeine use-   Family History  Problem Relation Age of Onset  . Prostate cancer Father   . Heart attack Mother     MI at age 53  . Hypertension Mother   . Breast cancer Sister   . Throat cancer Brother   . Lung cancer Brother    Scheduled Meds: . aspirin  81 mg Oral Daily  .  carvedilol  3.125 mg Oral BID WC  . diclofenac sodium  2 g Topical QID  . furosemide  20 mg Intravenous BID  . heparin  5,000 Units Subcutaneous Q8H  . insulin aspart  0-5 Units Subcutaneous QHS  . insulin aspart  0-9 Units Subcutaneous TID WC  . losartan  25 mg Oral Daily  . multivitamin with minerals  1 tablet Oral Daily  . olopatadine  1 drop Both Eyes BID  . perflutren lipid microspheres (DEFINITY) IV suspension      . potassium chloride  40 mEq Oral Q6H  . rosuvastatin  40 mg Oral Daily  . sodium chloride flush  3 mL Intravenous Q12H  . vitamin B-12  1,000 mcg Oral Daily  . [START ON 10/30/2015] Vitamin D (Ergocalciferol)  50,000 Units Oral Q Wed   Continuous Infusions:  PRN Meds:.sodium chloride, acetaminophen, ondansetron (ZOFRAN) IV, perflutren lipid microspheres (DEFINITY) IV suspension, sodium chloride flush Medications Prior to Admission:  Prior to Admission medications   Medication Sig Start Date End Date Taking? Authorizing Provider  ergocalciferol (VITAMIN D2) 50000 UNITS capsule Take 50,000 Units by mouth every Wednesday.  11/20/10  Yes Historical Provider, MD  insulin NPH (HUMULIN N,NOVOLIN N) 100 UNIT/ML injection Inject 5 Units into the skin at bedtime.    Yes Historical Provider, MD  metFORMIN (GLUCOPHAGE) 1000 MG tablet Take 500 mg by mouth 2 (two) times daily with a meal. Per pt Dr. Michaelyn Barter instructed to decrease dosage to 500 mg twice a day   Yes Historical Provider, MD  pioglitazone (ACTOS) 30 MG tablet Take 30 mg by mouth daily.     Yes Historical Provider, MD  rosuvastatin (CRESTOR) 40 MG tablet Take 1 tablet (40 mg total) by mouth daily. 12/24/14  Yes Lelon Perla, MD  vitamin B-12 (CYANOCOBALAMIN) 1000 MCG tablet Take 1,000 mcg by mouth daily.   Yes Historical Provider, MD  amLODipine (NORVASC) 5 MG tablet Take 1 tablet (5 mg total) by mouth daily. 12/24/14   Lelon Perla, MD  aspirin 81 MG tablet Take 81 mg by mouth daily.    Historical Provider, MD  BEPREVE 1.5 % SOLN Place 1 drop into both eyes daily as needed (EYES).  11/01/13   Historical Provider, MD  carvedilol (COREG) 12.5 MG tablet Take 1 tablet (12.5 mg total) by mouth 2 (two) times daily. 07/24/15   Lelon Perla, MD  diclofenac sodium (VOLTAREN) 1 % GEL Apply 2 g topically 4 (four) times daily. 11/22/13   Richard Joelene Millin, DPM  exenatide (BYETTA 10 MCG PEN) 10 MCG/0.04ML SOLN Inject 10 mcg into the skin 2 (two) times daily with a meal. bid    Historical Provider, MD  Ferrous Sulfate (IRON) 325 (65 FE) MG TABS Take 1 tablet by mouth daily.    Historical Provider, MD  glimepiride (AMARYL) 4 MG tablet Take 2 mg by mouth daily with breakfast.    Historical Provider, MD  glucose blood (SMARTEST TEST) test strip 1 each by Other route 2 (two) times daily. Use as instructed    Historical Provider, MD  losartan (COZAAR) 100 MG tablet Take 1 tablet (100 mg total) by mouth daily. 09/17/14   Lelon Perla, MD  Multiple Vitamins-Minerals (CENTRUM SILVER ADULT 50+ PO) Take 1 tablet by mouth daily.    Historical Provider, MD  spironolactone (ALDACTONE) 25 MG tablet take 1 tablet by mouth once daily 12/22/13   Lelon Perla, MD   Allergies  Allergen Reactions  . Demerol  Nausea And Vomiting    Perfuse vomitting  . Percodan [Oxycodone-Aspirin] Nausea And Vomiting    Perfuse vomitting   Review of Systems  Unable to perform ROS: Dementia    Physical Exam  Constitutional: She appears well-developed and well-nourished.  HENT:  Head: Normocephalic and atraumatic.  Neck: Normal range of motion.  Cardiovascular:  Trace pedal edema  Pulmonary/Chest: Effort normal.  Neurological: She is  alert.  Oriented to self only  Skin: Skin is warm and dry.  Psychiatric:  Pt confused. Thought processes disorganized. Minimal ability to follow but 2-3 step commands  Nursing note and vitals reviewed.   Vital Signs: BP 139/65   Pulse 89   Temp 98.3 F (36.8 C) (Oral)   Resp 18   Ht _0  (1.626 m)   Wt 93.8 kg (206 lb 12.7 oz)   SpO2 96%   BMI 35.50 kg/m  Pain Assessment: PAINAD   Pain Score: 4    SpO2: SpO2: 96 % O2 Device:SpO2: 96 % O2 Flow Rate: .   IO: Intake/output summary:  Intake/Output Summary (Last 24 hours) at 10/27/15 1209 Last data filed at 10/27/15 0900  Gross per 24 hour  Intake              360 ml  Output                0 ml  Net              360 ml    LBM: Last BM Date: 10/26/15 Baseline Weight: Weight: 93.8 kg (206 lb 12.7 oz) Most recent weight: Weight: 93.8 kg (206 lb 12.7 oz)     Palliative Assessment/Data:   Flowsheet Rows   Flowsheet Row Most Recent Value  Intake Tab  Referral Department  Hospitalist  Unit at Time of Referral  Cardiac/Telemetry Unit  Palliative Care Primary Diagnosis  Cardiac  Date Notified  10/26/15  Palliative Care Type  New Palliative care  Reason for referral  Clarify Goals of Care  Date of Admission  10/26/15  Date first seen by Palliative Care  10/27/15  # of days Palliative referral response time  1 Day(s)  # of days IP prior to Palliative referral  0  Clinical Assessment  Palliative Performance Scale Score  30%  Pain Max last 24 hours  Not able to report  Pain Min Last 24 hours  Not able to report  Dyspnea Max Last 24 Hours  Not able to report  Dyspnea Min Last 24 hours  Not able to report  Nausea Max Last 24 Hours  Not able to report  Nausea Min Last 24 Hours  Not able to report  Anxiety Max Last 24 Hours  Not able to report  Anxiety Min Last 24 Hours  Not able to report  Other Max Last 24 Hours  Not able to report  Psychosocial & Spiritual Assessment  Palliative Care Outcomes  Patient/Family meeting  held?  Yes  Who was at the meeting?  husband      Time In: 1300 Time Out: 1415 Time Total: 75 min Greater than 50%  of this time was spent counseling and coordinating care related to the above assessment and plan. Staffed with Dr. Hartford Poli  Signed by: Dory Horn, NP   Please contact Palliative Medicine Team phone at [phone] for questions and concerns.  For individual provider: See Shea Evans

## 2015-10-28 DIAGNOSIS — Z7189 Other specified counseling: Secondary | ICD-10-CM | POA: Diagnosis not present

## 2015-10-28 DIAGNOSIS — T39395A Adverse effect of other nonsteroidal anti-inflammatory drugs [NSAID], initial encounter: Secondary | ICD-10-CM | POA: Diagnosis not present

## 2015-10-28 DIAGNOSIS — Z7982 Long term (current) use of aspirin: Secondary | ICD-10-CM | POA: Diagnosis not present

## 2015-10-28 DIAGNOSIS — F0391 Unspecified dementia with behavioral disturbance: Secondary | ICD-10-CM | POA: Diagnosis not present

## 2015-10-28 DIAGNOSIS — Z789 Other specified health status: Secondary | ICD-10-CM | POA: Diagnosis not present

## 2015-10-28 DIAGNOSIS — Z96611 Presence of right artificial shoulder joint: Secondary | ICD-10-CM | POA: Diagnosis present

## 2015-10-28 DIAGNOSIS — Z6835 Body mass index (BMI) 35.0-35.9, adult: Secondary | ICD-10-CM | POA: Diagnosis not present

## 2015-10-28 DIAGNOSIS — Z9114 Patient's other noncompliance with medication regimen: Secondary | ICD-10-CM | POA: Diagnosis not present

## 2015-10-28 DIAGNOSIS — T501X5A Adverse effect of loop [high-ceiling] diuretics, initial encounter: Secondary | ICD-10-CM | POA: Diagnosis not present

## 2015-10-28 DIAGNOSIS — H9190 Unspecified hearing loss, unspecified ear: Secondary | ICD-10-CM | POA: Diagnosis present

## 2015-10-28 DIAGNOSIS — R410 Disorientation, unspecified: Secondary | ICD-10-CM | POA: Diagnosis not present

## 2015-10-28 DIAGNOSIS — I5023 Acute on chronic systolic (congestive) heart failure: Secondary | ICD-10-CM | POA: Diagnosis not present

## 2015-10-28 DIAGNOSIS — I110 Hypertensive heart disease with heart failure: Secondary | ICD-10-CM | POA: Diagnosis present

## 2015-10-28 DIAGNOSIS — Z951 Presence of aortocoronary bypass graft: Secondary | ICD-10-CM | POA: Diagnosis not present

## 2015-10-28 DIAGNOSIS — R778 Other specified abnormalities of plasma proteins: Secondary | ICD-10-CM | POA: Diagnosis present

## 2015-10-28 DIAGNOSIS — R0600 Dyspnea, unspecified: Secondary | ICD-10-CM | POA: Diagnosis not present

## 2015-10-28 DIAGNOSIS — N179 Acute kidney failure, unspecified: Secondary | ICD-10-CM | POA: Diagnosis not present

## 2015-10-28 DIAGNOSIS — Z79899 Other long term (current) drug therapy: Secondary | ICD-10-CM | POA: Diagnosis not present

## 2015-10-28 DIAGNOSIS — I2510 Atherosclerotic heart disease of native coronary artery without angina pectoris: Secondary | ICD-10-CM | POA: Diagnosis present

## 2015-10-28 DIAGNOSIS — M170 Bilateral primary osteoarthritis of knee: Secondary | ICD-10-CM | POA: Diagnosis present

## 2015-10-28 DIAGNOSIS — Z955 Presence of coronary angioplasty implant and graft: Secondary | ICD-10-CM | POA: Diagnosis not present

## 2015-10-28 DIAGNOSIS — E1165 Type 2 diabetes mellitus with hyperglycemia: Secondary | ICD-10-CM | POA: Diagnosis present

## 2015-10-28 DIAGNOSIS — E785 Hyperlipidemia, unspecified: Secondary | ICD-10-CM | POA: Diagnosis present

## 2015-10-28 DIAGNOSIS — R609 Edema, unspecified: Secondary | ICD-10-CM | POA: Diagnosis present

## 2015-10-28 DIAGNOSIS — Z95810 Presence of automatic (implantable) cardiac defibrillator: Secondary | ICD-10-CM | POA: Diagnosis not present

## 2015-10-28 DIAGNOSIS — I5022 Chronic systolic (congestive) heart failure: Secondary | ICD-10-CM | POA: Diagnosis not present

## 2015-10-28 DIAGNOSIS — F0390 Unspecified dementia without behavioral disturbance: Secondary | ICD-10-CM | POA: Diagnosis present

## 2015-10-28 DIAGNOSIS — Z794 Long term (current) use of insulin: Secondary | ICD-10-CM | POA: Diagnosis not present

## 2015-10-28 DIAGNOSIS — I255 Ischemic cardiomyopathy: Secondary | ICD-10-CM | POA: Diagnosis present

## 2015-10-28 DIAGNOSIS — D509 Iron deficiency anemia, unspecified: Secondary | ICD-10-CM | POA: Diagnosis present

## 2015-10-28 DIAGNOSIS — Z515 Encounter for palliative care: Secondary | ICD-10-CM | POA: Diagnosis not present

## 2015-10-28 DIAGNOSIS — I252 Old myocardial infarction: Secondary | ICD-10-CM | POA: Diagnosis not present

## 2015-10-28 LAB — BASIC METABOLIC PANEL
Anion gap: 7 (ref 5–15)
BUN: 13 mg/dL (ref 6–20)
CO2: 26 mmol/L (ref 22–32)
CREATININE: 0.84 mg/dL (ref 0.44–1.00)
Calcium: 9.1 mg/dL (ref 8.9–10.3)
Chloride: 103 mmol/L (ref 101–111)
Glucose, Bld: 285 mg/dL — ABNORMAL HIGH (ref 65–99)
POTASSIUM: 4.3 mmol/L (ref 3.5–5.1)
SODIUM: 136 mmol/L (ref 135–145)

## 2015-10-28 LAB — URIC ACID: Uric Acid, Serum: 4.9 mg/dL (ref 2.3–6.6)

## 2015-10-28 LAB — URINE CULTURE

## 2015-10-28 LAB — GLUCOSE, CAPILLARY
GLUCOSE-CAPILLARY: 339 mg/dL — AB (ref 65–99)
GLUCOSE-CAPILLARY: 353 mg/dL — AB (ref 65–99)
Glucose-Capillary: 246 mg/dL — ABNORMAL HIGH (ref 65–99)
Glucose-Capillary: 364 mg/dL — ABNORMAL HIGH (ref 65–99)

## 2015-10-28 MED ORDER — INDOMETHACIN ER 75 MG PO CPCR
75.0000 mg | ORAL_CAPSULE | Freq: Two times a day (BID) | ORAL | Status: DC
  Administered 2015-10-28: 75 mg via ORAL
  Filled 2015-10-28 (×2): qty 1

## 2015-10-28 MED ORDER — PREDNISONE 10 MG PO TABS
10.0000 mg | ORAL_TABLET | Freq: Every day | ORAL | Status: DC
  Administered 2015-10-29: 10 mg via ORAL
  Filled 2015-10-28: qty 1

## 2015-10-28 MED ORDER — COLCHICINE 0.6 MG PO TABS
0.6000 mg | ORAL_TABLET | Freq: Every day | ORAL | Status: DC
  Administered 2015-10-28: 0.6 mg via ORAL
  Filled 2015-10-28 (×2): qty 1

## 2015-10-28 MED ORDER — INSULIN GLARGINE 100 UNIT/ML ~~LOC~~ SOLN
20.0000 [IU] | Freq: Every day | SUBCUTANEOUS | Status: DC
  Administered 2015-10-28: 20 [IU] via SUBCUTANEOUS
  Filled 2015-10-28 (×2): qty 0.2

## 2015-10-28 MED ORDER — METHYLPREDNISOLONE SODIUM SUCC 125 MG IJ SOLR
125.0000 mg | Freq: Once | INTRAMUSCULAR | Status: AC
  Administered 2015-10-28: 125 mg via INTRAVENOUS
  Filled 2015-10-28: qty 2

## 2015-10-28 NOTE — Care Management Note (Signed)
Case Management Note Donn Pierini RN, BSN Unit 2W-Case Manager [phone]  Patient Details  Name: Ashlee [name] MRN: [id number] Date of Birth: 03-02-1940  Subjective/Objective:    Pt admitted with AMS,  Acute on chronic systolic CHF                Action/Plan: PTA pt lived at home with husband, reportedly has developed memory loss and confusion starting in 2016- unsure of home situation and husbands ability to assist pt with medications and daily care- CSW has been consulted, also has recommendations for STSNF, PC has also been consulted for GOC. CM to follow for further d/c needs and plan.   Expected Discharge Date:                  Expected Discharge Plan:  Skilled Nursing Facility  In-House Referral:  Clinical Social Work  Discharge planning Services  CM Consult  Post Acute Care Choice:    Choice offered to:     DME Arranged:    DME Agency:     HH Arranged:    HH Agency:     Status of Service:  In process, will continue to follow  If discussed at Long Length of Stay Meetings, dates discussed:    Additional Comments:  Darrold Span, RN 10/28/2015, 10:53 AM

## 2015-10-28 NOTE — Progress Notes (Addendum)
Physical Therapy Treatment Patient Details Name: Ashlee [name] MRN: [id number] DOB: 07/19/1940 Today's Date: 10/28/2015    History of Present Illness Patient is a 75 yo female admitted 10/26/15 with LE edema and increased confusion.  Patient with acute on chronic CHF.   PMH:  CHF, CAD, MI, CABG, ICD, DM, HTN, memory loss/confusion, HOH, medical noncomliance (see chart)    PT Comments    Pt on arrival unable to state name, situation or location. She tends to have nonsensical conversation and referring to spouse not in room initially. Spouse arrived during session stating pt has had progressive confusion but that the acute setting is much worse and confirmed that pt was moving and walking prior to admission. At this time unable to get pt to follow commands to participate fully in mobility and unable to transfer OOB with 2 person assist. Pt limited by cognitive impairments and unable to progress function beyond EOB at this time. Performed bil LE ROM EOB as pt unable to follow commands to assist. Pt requires at least 2 person assist at this time and continue to recommend SNF. Will follow acutely as pt able to participate.   Follow Up Recommendations  SNF;Supervision/Assistance - 24 hour     Equipment Recommendations       Recommendations for Other Services       Precautions / Restrictions Precautions Precautions: Fall Precaution Comments: Pt disoriented to self, situation, unable to follow commands which has been progression decline in mental state per spouse Restrictions Weight Bearing Restrictions: No    Mobility  Bed Mobility Overal bed mobility: Needs Assistance;+2 for physical assistance Bed Mobility: Rolling;Supine to Sit;Sit to Supine Rolling: Total assist;+2 for physical assistance   Supine to sit: HOB elevated;+2 for physical assistance;Max assist Sit to supine: Total assist;+2 for physical assistance   General bed mobility comments: Attempted to have pt reach for rail and  roll with assist of pad but pt not assisting and at times resisting movement. With assist to pivot bil LE to EOB and elevate trunk able to achieve sitting EOB with max +2 assist. Pt able to sit with minguard assist EOB 5 min. Return to supine total assist to control trunk and bring legs to surface. Total assist in trendelenburg [facility]  Transfers                 General transfer comment: In sitting attempted to have pt stand and even with assist for initiation of anterior translation pt making no effort to stand or weight shift. Lift equipment required at this time for OOB  Ambulation/Gait                 Stairs            Wheelchair Mobility    Modified Rankin (Stroke Patients Only)       Balance Overall balance assessment: Needs assistance   Sitting balance-Leahy Scale: Fair                              Cognition Arousal/Alertness: Awake/alert Behavior During Therapy: WFL for tasks assessed/performed Overall Cognitive Status: Impaired/Different from baseline Area of Impairment: Orientation;Attention;Memory;Following commands;Safety/judgement Orientation Level: Disoriented to;Time;Situation;Place;Person Current Attention Level: Focused Memory: Decreased short-term memory Following Commands: Follows one step commands inconsistently Safety/Judgement: Decreased awareness of safety;Decreased awareness of deficits   Problem Solving: Slow processing;Difficulty sequencing;Requires tactile cues;Decreased initiation General Comments: Pt unable to follow one step commands for movement or  attend to question asked and respond. spouse present stating she normally is aware of self but that confusion has been getting worse for some time    Exercises General Exercises - Lower Extremity Long Arc Quad: PROM;Both;5 reps;Seated Hip Flexion/Marching: PROM;Both;5 reps;Seated    General Comments        Pertinent Vitals/Pain Pain Assessment:  (PAINAD=  1) Pain Intervention(s): Repositioned    Home Living                      Prior Function            PT Goals (current goals can now be found in the care plan section) Progress towards PT goals: Not progressing toward goals - comment;Goals downgraded-see care plan (due to AMS)    Frequency  Min 2X/week    PT Plan Frequency needs to be updated;Current plan remains appropriate    Co-evaluation             End of Session Equipment Utilized During Treatment: Gait belt Activity Tolerance: Patient tolerated treatment well Patient left: in bed;with call bell/phone within reach;with family/visitor present;with bed alarm set     Time: 5035-4656 PT Time Calculation (min) (ACUTE ONLY): 22 min  Charges:  $Therapeutic Activity: 8-22 mins                    G Codes:      Delorse Lek 11-08-2015, 12:15 PM Delaney Meigs, PT [phone]

## 2015-10-28 NOTE — Progress Notes (Signed)
PROGRESS NOTE  Ashlee [name]  ZOX:[id number] DOB: 05/31/1940 DOA: 10/26/2015 PCP: Junious Silk, MD Outpatient Specialists:  Subjective: Continued to be very confused and disoriented, had pain when especially moving left leg. Bilateral hip CT scan did not show any bony abnormalities in the hips, canal stenosis at L4-L5. On exam patient has boggy, swollen and very tender left knee.  Brief Narrative:  Ashlee [name] is a 76 y.o. female with medical history significant for chronic systolic CHF, coronary artery disease with stent, insulin-dependent diabetes mellitus, hypertension, and memory loss with confusion followed by neurology in the outpatient setting who presents to the emergency department with worsening confusion and bilateral lower extremity edema. History is obtained through discussion with the ED personnel, review of the EMR, and discussion with the patient's husband and first cousin at bedside. Patient significantly confused and unable to contribute to the history. Patient reportedly began developing confusion and memory loss towards the end of 2016 and has been under the evaluation of her neurologist. MRI brain was ordered back in February for evaluation of this, but the patient has reportedly refused to have this study performed. Given the worsening confusion and memory problems, the neurologist had come to an agreement with the patient's husband that he would take over her medication management. Unfortunately, the patient has not taken any of her medications since that time, and per the report of the patient's cousin at the bedside, her husband seems to have given up on her. Patient is no longer taking her insulin or any of her medications. She reportedly developed swelling in the bilateral lower extremities yesterday. She is also reported to have increasing confusion, but this seems to be a slow and progressive worsening over the past 10 months or so. There has been no acute change in  her mental status.  Assessment & Plan:   Principal Problem:   Acute on chronic systolic CHF (congestive heart failure) (HCC) Active Problems:   CAD (coronary artery disease)   Hypertension   Insulin dependent diabetes mellitus (HCC)   ICD (implantable cardioverter-defibrillator) in place   Chronic confusion   Elevated lactic acid level   Dyspnea   Palliative care encounter   Acute on chronic systolic CHF  - Presents with peripheral edema, BNP 505, and some mild respiratory distress  - Right leg swelling more pronounced than left; prelim read on venous doppler is no DVT  - TTE (08/01/2013) with EF 30%, diffuse HK, mild LVH, mild TR  - Had previously been on Coreg, losartan, and Aldactone before she stopped taking her medications >6 mos ago (see discussion below under "Confusion, memory loss")  - Suspect this is secondary to non-adherence to treatment plan; rule-out acute MI as cause with serial troponins and EKG's - Monitor on telemetry, diurese with Lasix 20 mg IV q12h, LVEF 2-D echo is 30-35%. - Elevate lower extremity, daily weight, restrict intake and output.  Arthritis, bilateral knee pain left more than right -Appears to have severe arthritis with periarticular joint swelling and warmth. -We'll treat with steroids, colchicine and NSAIDs. -Check uric acid.  Elevated troponin -Slightly elevated troponin at 0.06, this is likely secondary to CHF, 2-D echo to be done. -Flat curve does not suggest this yes.  Insulin-dependent DM  - A1c 6.3% in 2014  - She had been managed with glimepiride, Byetta, Actos, and Humilin 5 units qHS until she stopped her medications   - Check A1c, SSI and carbohydrate modified diet.  Hypertension  - Mildly elevated on admission  -  Previously managed with Norvasc, Coreg, losartan, and Aldactone  - Will resume Coreg and losartan at low doses; IV Lasix as above    Confusion, memory loss  - Has been worsening since late 2016 and she has been  under the care of outpatient neurology  - She is suspected to have dementia and neurologist was planning to start some medications after obtaining MRI brain - Patient has reportedly refused the MRI, will order again see if she tolerates that.  - She is high risk for sundowning.  CAD - Underwent 4v CABG in 2014  - No anginal complaints; initial troponin wnl  - EKG with ST-changes likely secondary to LVH  - Monitor on telemetry and follow serial troponin  - Repeat EKG in am    Elevated lactic acid  - Lactate mildly elevated to 2.11 on admission, remaining 2.11 three hrs later after a 500 cc NS bolus was given  - Could be lower perfusion cause of CHF.      DVT prophylaxis:  Code Status: Full Code Family Communication:  Disposition Plan:  Diet: Diet heart healthy/carb modified Room service appropriate? Yes; Fluid consistency: Thin; Fluid restriction: 1500 mL Fluid  Consultants:   None  Procedures:   None  Antimicrobials:   None  Objective: Vitals:   10/27/15 1543 10/27/15 2105 10/28/15 0601 10/28/15 0852  BP: (!) 134/55 137/66 123/67 (!) 114/51  Pulse: 73 81 70 76  Resp: 16 17 18 18   Temp: 99 F (37.2 C) 98.2 F (36.8 C) 97.5 F (36.4 C) 98.4 F (36.9 C)  TempSrc: Axillary Axillary Axillary Oral  SpO2: 99% 100% 98% 95%  Weight:   91.5 kg (201 lb 11.5 oz)   Height:        Intake/Output Summary (Last 24 hours) at 10/28/15 0911 Last data filed at 10/28/15 0837  Gross per 24 hour  Intake              890 ml  Output                0 ml  Net              890 ml   Filed Weights   10/26/15 2121 10/28/15 0601  Weight: 93.8 kg (206 lb 12.7 oz) 91.5 kg (201 lb 11.5 oz)    Examination: General exam: Appears calm and comfortable  Respiratory system: Clear to auscultation. Respiratory effort normal. Cardiovascular system: S1 & S2 heard, RRR. No JVD, murmurs, rubs, gallops or clicks. No pedal edema. Gastrointestinal system: Abdomen is nondistended, soft and  nontender. No organomegaly or masses felt. Normal bowel sounds heard. Central nervous system: Alert and oriented. No focal neurological deficits. Extremities: Symmetric 5 x 5 power. Skin: No rashes, lesions or ulcers Psychiatry: Judgement and insight appear normal. Mood & affect appropriate.   Data Reviewed: I have personally reviewed following labs and imaging studies  CBC:  Recent Labs Lab 10/26/15 1555  WBC 9.0  NEUTROABS 7.0  HGB 13.9  HCT 43.2  MCV 84.0  PLT 244   Basic Metabolic Panel:  Recent Labs Lab 10/26/15 1555 10/27/15 0701 10/28/15 0224  NA 135 136 136  K 3.9 3.4* 4.3  CL 103 103 103  CO2 23 21* 26  GLUCOSE 269* 267* 285*  BUN 11 11 13   CREATININE 0.86 0.89 0.84  CALCIUM 9.6 8.9 9.1  MG  --  2.0  --    GFR: Estimated Creatinine Clearance: 61.4 mL/min (by C-G formula based on SCr of  0.84 mg/dL). Liver Function Tests:  Recent Labs Lab 10/26/15 1555  AST 27  ALT 12*  ALKPHOS 75  BILITOT 0.9  PROT 8.2*  ALBUMIN 3.4*    Recent Labs Lab 10/26/15 1555  LIPASE 24   No results for input(s): AMMONIA in the last 168 hours. Coagulation Profile: No results for input(s): INR, PROTIME in the last 168 hours. Cardiac Enzymes:  Recent Labs Lab 10/27/15 0110 10/27/15 0701 10/27/15 1450  TROPONINI 0.06* 0.06* 0.06*   BNP (last 3 results) No results for input(s): PROBNP in the last 8760 hours. HbA1C:  Recent Labs  10/27/15 0110  HGBA1C 9.9*   CBG:  Recent Labs Lab 10/27/15 0627 10/27/15 1136 10/27/15 1623 10/27/15 2101 10/28/15 0600  GLUCAP 280* 311* 275* 269* 246*   Lipid Profile: No results for input(s): CHOL, HDL, LDLCALC, TRIG, CHOLHDL, LDLDIRECT in the last 72 hours. Thyroid Function Tests: No results for input(s): TSH, T4TOTAL, FREET4, T3FREE, THYROIDAB in the last 72 hours. Anemia Panel: No results for input(s): VITAMINB12, FOLATE, FERRITIN, TIBC, IRON, RETICCTPCT in the last 72 hours. Urine analysis:    Component Value  Date/Time   COLORURINE AMBER (A) 10/26/2015 1833   APPEARANCEUR CLOUDY (A) 10/26/2015 1833   LABSPEC 1.031 (H) 10/26/2015 1833   PHURINE 6.0 10/26/2015 1833   GLUCOSEU >1000 (A) 10/26/2015 1833   HGBUR SMALL (A) 10/26/2015 1833   BILIRUBINUR SMALL (A) 10/26/2015 1833   KETONESUR 40 (A) 10/26/2015 1833   PROTEINUR 100 (A) 10/26/2015 1833   UROBILINOGEN 1.0 07/09/2012 2227   NITRITE NEGATIVE 10/26/2015 1833   LEUKOCYTESUR NEGATIVE 10/26/2015 1833   Sepsis Labs: @LABRCNTIP (procalcitonin:4,lacticidven:4)  )No results found for this or any previous visit (from the past 240 hour(s)).   Invalid input(s): PROCALCITONIN, LACTICACIDVEN   Radiology Studies: Dg Chest 2 View  Result Date: 10/26/2015 CLINICAL DATA:  Altered mental status.  Coronary artery disease. EXAM: CHEST  2 VIEW COMPARISON:  09/29/2013 FINDINGS: Heart size is stable. Aortic atherosclerosis. AICD remains in appropriate position. Prior CABG again noted. There left basilar scarring and elevation of left hemidiaphragm are stable. No evidence of acute infiltrate or pulmonary edema. No evidence of pleural effusion or pneumothorax. IMPRESSION: Stable left basilar scarring.  No acute findings. Electronically Signed   By: Myles Rosenthal M.D.   On: 10/26/2015 16:51   Ct Hip Left Wo Contrast  Result Date: 10/27/2015 CLINICAL DATA:  BILATERAL hip pain EXAM: CT OF THE LEFT HIP WITHOUT CONTRAST CT OF THE RIGHT HIP WITHOUT CONTRAST TECHNIQUE: Multidetector CT imaging of the left hip was performed according to the standard protocol. Multiplanar CT image reconstructions were also generated. COMPARISON:  Pelvic and LEFT hip radiographs 10/27/2015 FINDINGS: Bones/Joint/Cartilage Hip and SI joints symmetric and preserved. Sclerotic focus LEFT ilium adjacent SI joint 15 mm greatest size. No fracture, dislocation, or bone destruction. Significant multifactorial spinal stenosis at L4-L5 due to bulging disc, facet hypertrophy and ligamentum flavum  thickening. Bulging disc versus broad-based disc herniation at L5-S1. Ligaments Suboptimally assessed by CT. Muscles and Tendons Symmetric muscular planes. Soft tissues Scattered atherosclerotic calcifications bilaterally. Visualized portion of appendix normal. Minimal sigmoid diverticulosis; bowel loops otherwise grossly normal for exam lacking IV and oral contrast. Normal appearing bladder, distal ureters, uterus. No free air or free fluid in pelvis. IMPRESSION: Severe multifactorial central acquired spinal stenosis at L4-L5. Bulging tears versus broad-based disc herniation L5-S1 indenting thecal sac. Sclerotic focus that LEFT ilium adjacent to the SI joint, very dense and sharply defined, favor bone island. No definite acute osseous  abnormalities. Minimal sigmoid diverticulosis. Electronically Signed   By: Ulyses Southward M.D.   On: 10/27/2015 20:58   Ct Hip Right Wo Contrast  Result Date: 10/27/2015 CLINICAL DATA:  BILATERAL hip pain EXAM: CT OF THE LEFT HIP WITHOUT CONTRAST CT OF THE RIGHT HIP WITHOUT CONTRAST TECHNIQUE: Multidetector CT imaging of the left hip was performed according to the standard protocol. Multiplanar CT image reconstructions were also generated. COMPARISON:  Pelvic and LEFT hip radiographs 10/27/2015 FINDINGS: Bones/Joint/Cartilage Hip and SI joints symmetric and preserved. Sclerotic focus LEFT ilium adjacent SI joint 15 mm greatest size. No fracture, dislocation, or bone destruction. Significant multifactorial spinal stenosis at L4-L5 due to bulging disc, facet hypertrophy and ligamentum flavum thickening. Bulging disc versus broad-based disc herniation at L5-S1. Ligaments Suboptimally assessed by CT. Muscles and Tendons Symmetric muscular planes. Soft tissues Scattered atherosclerotic calcifications bilaterally. Visualized portion of appendix normal. Minimal sigmoid diverticulosis; bowel loops otherwise grossly normal for exam lacking IV and oral contrast. Normal appearing bladder, distal  ureters, uterus. No free air or free fluid in pelvis. IMPRESSION: Severe multifactorial central acquired spinal stenosis at L4-L5. Bulging tears versus broad-based disc herniation L5-S1 indenting thecal sac. Sclerotic focus that LEFT ilium adjacent to the SI joint, very dense and sharply defined, favor bone island. No definite acute osseous abnormalities. Minimal sigmoid diverticulosis. Electronically Signed   By: Ulyses Southward M.D.   On: 10/27/2015 20:58   Dg Hip Unilat With Pelvis 2-3 Views Left  Result Date: 10/27/2015 CLINICAL DATA:  Left leg edema EXAM: DG HIP (WITH OR WITHOUT PELVIS) 2-3V LEFT COMPARISON:  None. FINDINGS: No fracture or dislocation is seen. Bilateral hip joint spaces are preserved. Visualized bony pelvis appears intact. IMPRESSION: No acute osseus abnormality is seen. Electronically Signed   By: Charline Bills M.D.   On: 10/27/2015 14:41        Scheduled Meds: . aspirin  81 mg Oral Daily  . carvedilol  3.125 mg Oral BID WC  . cefTRIAXone (ROCEPHIN)  IV  1 g Intravenous Q24H  . diclofenac sodium  2 g Topical QID  . furosemide  20 mg Intravenous BID  . heparin  5,000 Units Subcutaneous Q8H  . insulin aspart  0-9 Units Subcutaneous TID WC  . insulin glargine  12 Units Subcutaneous QHS  . losartan  25 mg Oral Daily  . multivitamin with minerals  1 tablet Oral Daily  . olopatadine  1 drop Both Eyes BID  . rosuvastatin  40 mg Oral Daily  . sodium chloride flush  3 mL Intravenous Q12H  . vitamin B-12  1,000 mcg Oral Daily  . [START ON 10/30/2015] Vitamin D (Ergocalciferol)  50,000 Units Oral Q Wed   Continuous Infusions:    LOS: 0 days    Time spent: 35 minutes    Graelyn Bihl A, MD [facility] Pager [phone]  If 7PM-7AM, please contact night-coverage [email or web address] Password [email or web address] 10/28/2015, 9:11 AM

## 2015-10-28 NOTE — NC FL2 (Signed)
[place] MEDICAID FL2 LEVEL OF CARE SCREENING TOOL     IDENTIFICATION  Patient Name: Ashlee [name] Birthdate: 1940-07-01 Sex: female Admission Date (Current Location): 10/26/2015  [facility] and [place] Number:  Producer, television/film/video and Address:  [facility], [address], [place], [place] [address]      Provider Number: [id number]  Attending Physician Name and Address:  Clydia Llano, MD  Relative Name and Phone Number:       Current Level of Care: Hospital Recommended Level of Care: Skilled Nursing Facility Prior Approval Number:    Date Approved/Denied:   PASRR Number: [id number]  Discharge Plan: SNF    Current Diagnoses: Patient Active Problem List   Diagnosis Date Noted  . Dyspnea   . Palliative care encounter   . Edema 10/26/2015  . Acute on chronic systolic CHF (congestive heart failure) (HCC) 10/26/2015  . Chronic confusion 10/26/2015  . Elevated lactic acid level 10/26/2015  . 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 Ischemic cardiomyopathy 09/28/2013  . Insulin dependent diabetes mellitus (HCC) 08/10/2012  . Cardiomyopathy, ischemic 06/30/2012  . Chest pain 06/24/2010  . CAD (coronary artery disease) 06/24/2010  . Hypertension 06/24/2010  . Hyperlipidemia 06/24/2010  . Murmur 06/24/2010  . Bruit 06/24/2010    Orientation RESPIRATION BLADDER Height & Weight     Self  Normal Incontinent Weight: 201 lb 11.5 oz (91.5 kg) Height:  5\' 4"  (162.6 cm)  BEHAVIORAL SYMPTOMS/MOOD NEUROLOGICAL BOWEL NUTRITION STATUS   (none)  (none) Continent Diet (Heart Healthy/ Carb Modified)  AMBULATORY STATUS COMMUNICATION OF NEEDS Skin   Extensive Assist Verbally Normal                       Personal Care Assistance Level of Assistance  Bathing, Feeding, Dressing Bathing Assistance: Limited assistance Feeding assistance: Limited assistance Dressing Assistance: Limited assistance     Functional Limitations  Info  Sight, Hearing, Speech Sight Info: Adequate Hearing Info: Adequate Speech Info: Adequate    SPECIAL CARE FACTORS FREQUENCY  PT (By licensed PT), OT (By licensed OT)     PT Frequency: 5/ week  OT Frequency: 5/ week            Contractures Contractures Info: Not present    Additional Factors Info  Code Status, Insulin Sliding Scale, Allergies Code Status Info: FULL Allergies Info: Demerol, Percodan Oxycodone-aspirin   Insulin Sliding Scale Info: insulin aspart (novoLOG) injection 0-9 Units Dose: 0-9 Units Freq: 3 times daily with meals Route: [place]       Current Medications (10/28/2015):  This is the current hospital active medication list Current Facility-Administered Medications  Medication Dose Route Frequency Provider Last Rate Last Dose  . 0.9 %  sodium chloride infusion  250 mL Intravenous PRN Briscoe Deutscher, MD      . acetaminophen (TYLENOL) tablet 650 mg  650 mg Oral Q4H PRN Briscoe Deutscher, MD   650 mg at 10/27/15 1550  . aspirin chewable tablet 81 mg  81 mg Oral Daily Clydia Llano, MD   81 mg at 10/28/15 1052  . carvedilol (COREG) tablet 3.125 mg  3.125 mg Oral BID WC Lavone Neri Opyd, MD   3.125 mg at 10/28/15 0636  . cefTRIAXone (ROCEPHIN) 1 g in dextrose 5 % 50 mL IVPB  1 g Intravenous Q24H Clydia Llano, MD   1 g at 10/27/15 1750  . colchicine tablet 0.6 mg  0.6 mg Oral Daily Clydia Llano, MD  0.6 mg at 10/28/15 1052  . diclofenac sodium (VOLTAREN) 1 % transdermal gel 2 g  2 g Topical QID Briscoe Deutscherimothy S Opyd, MD   2 g at 10/28/15 1552  . furosemide (LASIX) injection 20 mg  20 mg Intravenous BID Briscoe Deutscherimothy S Opyd, MD   20 mg at 10/28/15 0855  . heparin injection 5,000 Units  5,000 Units Subcutaneous Q8H Briscoe Deutscherimothy S Opyd, MD   5,000 Units at 10/28/15 1553  . indomethacin (INDOCIN SR) capsule 75 mg  75 mg Oral BID WC Clydia LlanoMutaz Elmahi, MD   75 mg at 10/28/15 1553  . insulin aspart (novoLOG) injection 0-9 Units  0-9 Units Subcutaneous TID WC Briscoe Deutscherimothy S Opyd, MD   7 Units at 10/28/15  1154  . insulin glargine (LANTUS) injection 20 Units  20 Units Subcutaneous QHS Clydia LlanoMutaz Elmahi, MD      . losartan (COZAAR) tablet 25 mg  25 mg Oral Daily Briscoe Deutscherimothy S Opyd, MD   25 mg at 10/28/15 1052  . multivitamin with minerals tablet 1 tablet  1 tablet Oral Daily Briscoe Deutscherimothy S Opyd, MD   1 tablet at 10/28/15 1052  . olopatadine (PATANOL) 0.1 % ophthalmic solution 1 drop  1 drop Both Eyes BID Briscoe Deutscherimothy S Opyd, MD   1 drop at 10/28/15 1053  . ondansetron (ZOFRAN) injection 4 mg  4 mg Intravenous Q6H PRN Briscoe Deutscherimothy S Opyd, MD      . Melene Muller[START ON 10/29/2015] predniSONE (DELTASONE) tablet 10 mg  10 mg Oral Q breakfast Clydia LlanoMutaz Elmahi, MD      . rosuvastatin (CRESTOR) tablet 40 mg  40 mg Oral Daily Briscoe Deutscherimothy S Opyd, MD   40 mg at 10/28/15 1052  . sodium chloride flush (NS) 0.9 % injection 3 mL  3 mL Intravenous Q12H Lavone Neriimothy S Opyd, MD   3 mL at 10/28/15 1053  . sodium chloride flush (NS) 0.9 % injection 3 mL  3 mL Intravenous PRN Briscoe Deutscherimothy S Opyd, MD      . traMADol (ULTRAM) tablet 25 mg  25 mg Oral Q6H PRN Clydia LlanoMutaz Elmahi, MD   25 mg at 10/28/15 0149  . vitamin B-12 (CYANOCOBALAMIN) tablet 1,000 mcg  1,000 mcg Oral Daily Briscoe Deutscherimothy S Opyd, MD   1,000 mcg at 10/28/15 1052  . [START ON 10/30/2015] Vitamin D (Ergocalciferol) (DRISDOL) capsule 50,000 Units  50,000 Units Oral Q Wed Briscoe Deutscherimothy S Opyd, MD         Discharge Medications: Please see discharge summary for a list of discharge medications.  Relevant Imaging Results:  Relevant Lab Results:   Additional Information SSN: [id number]  Reggy EyeLaShonda A Capricia Serda, LCSW

## 2015-10-28 NOTE — Progress Notes (Signed)
Inpatient Diabetes Program Recommendations  AACE/ADA: New Consensus Statement on Inpatient Glycemic Control (2015)  Target Ranges:  Prepandial:   less than 140 mg/dL      Peak postprandial:   less than 180 mg/dL (1-2 hours)      Critically ill patients:  140 - 180 mg/dL   Lab Results  Component Value Date   GLUCAP 339 (H) 10/28/2015   HGBA1C 9.9 (H) 10/27/2015    Review of Glycemic Control  Diabetes history: DM2 Outpatient Diabetes medications: None - Byetta 10 mcg bid, amaryl 2 mg Q breakfast, NPH 5 units QHS, metformin 500 mg bid, Actos 30 QD. Current orders for Inpatient glycemic control: Lantus 20 units QHS, Novolog sensitive tidwc Received Solumedrol 125 mg 1x.  Inpatient Diabetes Program Recommendations:    Consider addition of Novolog 3 units tidwc for meal coverage insulin.  Will continue to follow. May need adjustment to diabetes meds prior to discharge.   Thank you. Ailene Ards, RD, LDN, CDE Inpatient Diabetes Coordinator [phone]

## 2015-10-28 NOTE — Clinical Social Work Note (Addendum)
Clinical Social Work Assessment  Patient Details  Name: Ashlee [name] MRN: [id number] Date of Birth: 1940/04/08  Date of referral:  10/28/15               Reason for consult:  Discharge Planning                Permission sought to share information with:  Facility Sport and exercise psychologist, Family Supports Permission granted to share information::  Yes, Verbal Permission Granted  Name::     Science writer::  SNFs  Relationship::  Husband  Contact Information:     Housing/Transportation Living arrangements for the past 2 months:  Single Family Home Source of Information:  Patient Patient Interpreter Needed:  None Criminal Activity/Legal Involvement Pertinent to Current Situation/Hospitalization:  No - Comment as needed Significant Relationships:  Spouse Lives with:  Spouse Do you feel safe going back to the place where you live?  Yes Need for family participation in patient care:  Yes (Comment)  Care giving concerns:  The patient and patient's husband are agreeable for short term rehab at discharge. Patient would like to rebuild  her strength to return home.   Social Worker assessment / plan: CSW met with patient at beside to complete assessment. Patient was resting comfortably in bed. Patient's husband was also at bedside. CSW explained PT recommendation for SNF placement. CSW explained SNF search and placement process to the patient and patient's husband and answered their questions. Patient's husband reported he does help patient with management of her medications, but patient refuses to take medications sometimes. He reported he has told patient's primary care provider of patient's refusal to take medications. CSW will follow up with bed.  offers.Employment status:  Retired Forensic scientist:    PT Recommendations:  [place] / Referral to community resources:  [place]  Patient/Family's Response to care:  The patient's husband is  happy with the care the patient has received.   Patient/Family's Understanding of and Emotional Response to Diagnosis, Current Treatment, and Prognosis:  The patient's husband has a good understanding of why the patient was admitted. He understands the care plan and what the patient  will need post discharge.  Emotional Assessment Appearance:  Appears stated age Attitude/Demeanor/Rapport:   (Patient was appropriate.) Affect (typically observed):  Accepting, Appropriate, Calm Orientation:  Oriented to Self, Oriented to Situation, Oriented to Place, Oriented to  Time Alcohol / Substance use:  Not Applicable Psych involvement (Current and /or in the community):  No (Comment)  Discharge Needs  Concerns to be addressed:  Discharge Planning Concerns Readmission within the last 30 days:  No Current discharge risk:  Physical Impairment Barriers to Discharge:  Continued Medical Work up   [employer], LCSW 10/28/2015, 4:31 PM

## 2015-10-29 DIAGNOSIS — I5022 Chronic systolic (congestive) heart failure: Secondary | ICD-10-CM

## 2015-10-29 LAB — BASIC METABOLIC PANEL
ANION GAP: 10 (ref 5–15)
BUN: 25 mg/dL — ABNORMAL HIGH (ref 6–20)
CHLORIDE: 100 mmol/L — AB (ref 101–111)
CO2: 25 mmol/L (ref 22–32)
CREATININE: 1.02 mg/dL — AB (ref 0.44–1.00)
Calcium: 9.5 mg/dL (ref 8.9–10.3)
GFR calc non Af Amer: 52 mL/min — ABNORMAL LOW (ref >60)
GFR, EST AFRICAN AMERICAN: 60 mL/min — AB (ref >60)
Glucose, Bld: 340 mg/dL — ABNORMAL HIGH (ref 65–99)
POTASSIUM: 4.7 mmol/L (ref 3.5–5.1)
SODIUM: 135 mmol/L (ref 135–145)

## 2015-10-29 LAB — GLUCOSE, CAPILLARY
GLUCOSE-CAPILLARY: 328 mg/dL — AB (ref 65–99)
GLUCOSE-CAPILLARY: 307 mg/dL — AB (ref 65–99)
GLUCOSE-CAPILLARY: 253 mg/dL — AB (ref 65–99)
Glucose-Capillary: 130 mg/dL — ABNORMAL HIGH (ref 65–99)

## 2015-10-29 LAB — FOLATE: FOLATE: 18.4 ng/mL (ref >5.9)

## 2015-10-29 LAB — TSH: TSH: 0.638 u[IU]/mL (ref 0.350–4.500)

## 2015-10-29 LAB — VITAMIN B12: Vitamin B-12: 3020 pg/mL — ABNORMAL HIGH (ref 180–914)

## 2015-10-29 LAB — AMMONIA: Ammonia: 28 umol/L (ref 9–35)

## 2015-10-29 MED ORDER — INSULIN ASPART 100 UNIT/ML ~~LOC~~ SOLN
5.0000 [IU] | Freq: Three times a day (TID) | SUBCUTANEOUS | Status: DC
  Administered 2015-10-29: 5 [IU] via SUBCUTANEOUS

## 2015-10-29 MED ORDER — PREDNISONE 20 MG PO TABS
50.0000 mg | ORAL_TABLET | Freq: Every day | ORAL | Status: DC
  Filled 2015-10-29: qty 2

## 2015-10-29 MED ORDER — INSULIN GLARGINE 100 UNIT/ML ~~LOC~~ SOLN
20.0000 [IU] | Freq: Two times a day (BID) | SUBCUTANEOUS | Status: DC
  Administered 2015-10-29 – 2015-10-30 (×3): 20 [IU] via SUBCUTANEOUS
  Filled 2015-10-29 (×5): qty 0.2

## 2015-10-29 MED ORDER — LORAZEPAM 2 MG/ML IJ SOLN
1.0000 mg | Freq: Once | INTRAMUSCULAR | Status: AC
  Administered 2015-10-29: 1 mg via INTRAVENOUS
  Filled 2015-10-29: qty 1

## 2015-10-29 MED ORDER — PNEUMOCOCCAL VAC POLYVALENT 25 MCG/0.5ML IJ INJ: 0.5000 mL | INJECTION | INTRAMUSCULAR | Status: DC

## 2015-10-29 NOTE — Progress Notes (Signed)
Daily Progress Note   Patient Name: Ashlee [name]       Date: 10/29/2015 DOB: Sep 12, 1940  Age: 75 y.o. MRN#: [id number] Attending Physician: Verlee Monte, MD Primary Care Physician: Limmie Patricia, MD Admit Date: 10/26/2015  Reason for Consultation/Follow-up: Disposition and Establishing goals of care  Subjective: Met with patient's spouse, Delrae Alfred, and first cousin, Altha Harm. Per Delrae Alfred he is concerned about discharging patient without full workup. He is requesting MRI to determine cause of patient's confusion. I had a lengthy discussion with Delrae Alfred, reviewing patient's history and the fact she was diagnosed with dementia in February. Further workup would have determined the cause, but the diagnosis of dementia does not change. Delrae Alfred states he was unable to convince patient to go for MRI in February and he also cannot get here to take medications at home. Altha Harm (who is a retired Therapist, sports) requested cardiac workup and I reviewed chart information with her noting patient had echocardiogram yesterday indicating CHF. I let her know there were also orders for TSH, Vit B12, folate and ammonia. I discussed the trajectory of dementia with family, more specifically, vascular dementia which is inline with patient's presentation. Delrae Alfred and Altha Harm did note that they weren't aware of patient's dementia diagnosis in February. Altha Harm also noted that in February, the same time as patient's initial neurological evaluation,  Alfred's sister died and this had caused him a great amount of grief, likely contributing to his mental state at the time.   Length of Stay: 1  Current Medications: Scheduled Meds:  . aspirin  81 mg Oral Daily  . carvedilol  3.125 mg Oral BID WC  . diclofenac sodium  2 g Topical QID    . heparin  5,000 Units Subcutaneous Q8H  . insulin aspart  0-9 Units Subcutaneous TID WC  . insulin aspart  5 Units Subcutaneous TID WC  . insulin glargine  20 Units Subcutaneous BID  . losartan  25 mg Oral Daily  . multivitamin with minerals  1 tablet Oral Daily  . olopatadine  1 drop Both Eyes BID  . [START ON 10/30/2015] pneumococcal 23 valent vaccine  0.5 mL Intramuscular Tomorrow-1000  . [START ON 10/30/2015] predniSONE  50 mg Oral Q breakfast  . rosuvastatin  40 mg Oral Daily  . sodium chloride flush  3 mL Intravenous Q12H  .  vitamin B-12  1,000 mcg Oral Daily  . [START ON 10/30/2015] Vitamin D (Ergocalciferol)  50,000 Units Oral Q Wed    Continuous Infusions:    PRN Meds: sodium chloride, acetaminophen, ondansetron (ZOFRAN) IV, sodium chloride flush, traMADol  Physical Exam  Constitutional: She appears well-developed and well-nourished.  HENT:  Head: Normocephalic and atraumatic.  Cardiovascular: Normal rate and regular rhythm.   Pulmonary/Chest: Effort normal and breath sounds normal.  Musculoskeletal:  Generalized weakness  Skin: Skin is warm and dry.  Psychiatric:  Pleasantly confused, confabulating            Vital Signs: BP (!) 127/48 (BP Location: Left Arm)   Pulse 84   Temp 98.5 F (36.9 C) (Oral)   Resp 18   Ht 5' 4"  (1.626 m)   Wt 93.5 kg (206 lb 2.1 oz)   SpO2 100%   BMI 35.38 kg/m  SpO2: SpO2: 100 % O2 Device: O2 Device: Not Delivered O2 Flow Rate:    Intake/output summary:  Intake/Output Summary (Last 24 hours) at 10/29/15 1532 Last data filed at 10/29/15 1500  Gross per 24 hour  Intake              343 ml  Output                1 ml  Net              342 ml   LBM: Last BM Date: 10/29/15 Baseline Weight: Weight: 93.8 kg (206 lb 12.7 oz) Most recent weight: Weight: 93.5 kg (206 lb 2.1 oz)       Palliative Assessment/Data: PPS: 30%   Flowsheet Rows   Flowsheet Row Most Recent Value  Intake Tab  Referral Department  Hospitalist  Unit at  Time of Referral  Cardiac/Telemetry Unit  Palliative Care Primary Diagnosis  Cardiac  Date Notified  10/26/15  Palliative Care Type  New Palliative care  Reason for referral  Clarify Goals of Care  Date of Admission  10/26/15  Date first seen by Palliative Care  10/27/15  # of days Palliative referral response time  1 Day(s)  # of days IP prior to Palliative referral  0  Clinical Assessment  Palliative Performance Scale Score  30%  Pain Max last 24 hours  Not able to report  Pain Min Last 24 hours  Not able to report  Dyspnea Max Last 24 Hours  Not able to report  Dyspnea Min Last 24 hours  Not able to report  Nausea Max Last 24 Hours  Not able to report  Nausea Min Last 24 Hours  Not able to report  Anxiety Max Last 24 Hours  Not able to report  Anxiety Min Last 24 Hours  Not able to report  Other Max Last 24 Hours  Not able to report  Psychosocial & Spiritual Assessment  Palliative Care Outcomes  Patient/Family meeting held?  Yes  Who was at the meeting?  husband      Patient Active Problem List   Diagnosis Date Noted  . Dyspnea   . Palliative care encounter   . Edema 10/26/2015  . Acute on chronic systolic CHF (congestive heart failure) ([place]) 10/26/2015  . Chronic confusion 10/26/2015  . Elevated lactic acid level 10/26/2015  . 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 Ischemic cardiomyopathy 09/28/2013  . Insulin dependent diabetes mellitus ([place]) 08/10/2012  . Cardiomyopathy, ischemic 06/30/2012  . Chest pain 06/24/2010  . CAD (coronary artery disease) 06/24/2010  .  Hypertension 06/24/2010  . Hyperlipidemia 06/24/2010  . Murmur 06/24/2010  . Bruit 06/24/2010    Palliative Care Assessment & Plan   Patient Profile:  75 y.o. female  with past medical history of Severe cardiac disease going back to 1998 when she had an MI, she also underwent heart catheterization in 2014, EF at that time was 25%, CABG in 2014 with EF 30%, 2015 EF documented  at 23% admitted on 10/26/2015 with increased lower extremity swelling, and increased confusion per her husband and first cousin. Per chart review patient has had memory loss and confusion for a while and has been followed by  neurology in the outpatient setting. At one point neurology recommended an MRI of the brain but this was not performed. Reportedly patient refused to have this done. Also noted that after discussion with neurologist husband agreed to take over medication management for patient but apparently she has not been taking any of her cardiac meds since that time (it is not clear to me how long she has been off of her medications). Patient is no longer taking her insulin and other oral medications. Chest x-ray was negative for any acute cardiopulmonary disease. Her vital signs were stable and she was afebrile saturating well on room air. Chemistry panels were unremarkable except for glucose was elevated at 269, and BNP was 505. Lactic acid was mildly elevated. Urinalysis was not suggestive of infection but she was given an empiric dose of Rocephin as well as fluid bolus in the emergency room..   Assessment: Delrae Alfred is resistant to accepting the trajectory of his wife's dementia, however, after our conversation he appeared affected by my mention of "end of life". He said he would still like to pursue MRI "just to know I did everything I could" because neurologist had ordered it in February, but he wasn't able to get patient to go. He feels that now she is in the hospital, it is a good opportunity to get more information; and then he will be more open to end of life discussions. I anticipate MRI will reveal no acute changes, but it will give family a sense of closure. I will follow up with Delrae Alfred tomorrow to continue our conversation.  Recommendations/Plan:  Full scope of care for now; will resume discussion tomorrow with Delrae Alfred once results are obtained  Goals of Care and Additional  Recommendations:  Limitations on Scope of Treatment: Full Scope Treatment  Code Status:    Code Status Orders        Start     Ordered   10/26/15 2138  Full code  Continuous     10/26/15 2137    Code Status History    Date Active Date Inactive Code Status Order ID Comments User Context   09/28/2013  5:16 PM 09/29/2013  2:46 PM Full Code [id number]  Deboraha Sprang, MD Inpatient   07/11/2012  2:46 PM 07/13/2012  2:29 PM Full Code [id number]  Coolidge Breeze, PA-C Inpatient       Prognosis:   Unable to determine  Discharge Planning:  [place] with Hospice  Care plan was discussed with spouse, Delrae Alfred; cousin Altha Harm, and Dr. Hartford Poli- patient's attending.  Thank you for allowing the Palliative Medicine Team to assist in the care of this patient.   Time In: 1230 Time Out: 1330 Total Time 60 Prolonged Time Billed no      Greater than 50%  of this time was spent counseling and coordinating care related to the above  assessment and plan.  Mariana Kaufman, AGNP-C Palliative Medicine    Please contact Palliative Medicine Team phone at [phone] for questions and concerns.

## 2015-10-29 NOTE — Progress Notes (Addendum)
PROGRESS NOTE  Ashlee [name]  ZOX:[id number] DOB: 09/22/1940 DOA: 10/26/2015 PCP: Junious SilkALTHEIMER,MICHAEL D, MD Outpatient Specialists:  Subjective: Seen with staff at bedside, patient is pleasantly confused, confabulating when he asked her questions.   Brief Narrative:  Ashlee [name] is a 75 y.o. female with medical history significant for chronic systolic CHF, coronary artery disease with stent, insulin-dependent diabetes mellitus, hypertension, and memory loss with confusion followed by neurology in the outpatient setting who presents to the emergency department with worsening confusion and bilateral lower extremity edema. History is obtained through discussion with the ED personnel, review of the EMR, and discussion with the patient's husband and first cousin at bedside. Patient significantly confused and unable to contribute to the history. Patient reportedly began developing confusion and memory loss towards the end of 2016 and has been under the evaluation of her neurologist. MRI brain was ordered back in February for evaluation of this, but the patient has reportedly refused to have this study performed. Given the worsening confusion and memory problems, the neurologist had come to an agreement with the patient's husband that he would take over her medication management. Unfortunately, the patient has not taken any of her medications since that time, and per the report of the patient's cousin at the bedside, her husband seems to have given up on her. Patient is no longer taking her insulin or any of her medications. She reportedly developed swelling in the bilateral lower extremities yesterday. She is also reported to have increasing confusion, but this seems to be a slow and progressive worsening over the past 10 months or so. There has been no acute change in her mental status.  Assessment & Plan:   Principal Problem:   Acute on chronic systolic CHF (congestive heart failure) (HCC) Active  Problems:   CAD (coronary artery disease)   Hypertension   Insulin dependent diabetes mellitus (HCC)   ICD (implantable cardioverter-defibrillator) in place   Chronic confusion   Elevated lactic acid level   Dyspnea   Palliative care encounter   Acute on chronic systolic CHF  - Presents with peripheral edema, BNP 505, and some mild respiratory distress  - Right leg swelling more pronounced than left; prelim read on venous doppler is no DVT  - TTE (08/01/2013) with EF 30%, diffuse HK, mild LVH, mild TR  - Reportedly nonadherent to medication secondary to memory problems. - 2-D echo showed LVEF of 33-35%, restarted on Coreg and Cozaar - Diuresed with IV Lasix, lower extremity edema resolved.  Arthritis, bilateral knee pain left more than right -Appears to have severe arthritis with periarticular joint swelling and warmth. -Empirically started on steroids, colchicine and NSAIDs. -Uric acid is normal, will discontinue indomethacin and colchicine, continue prednisone -Feels much better today, pain improved after initiation of steroids. Discharge likely on a steroid taper.  Elevated troponin -Slightly elevated troponin at 0.06, this is likely secondary to CHF, 2-D echo to be done. -Flat curve does not suggest this yes.  Insulin-dependent DM, uncontrolled  - A1c 6.3% in 2014 , A1c is 9.9 - She had been managed with glimepiride, Byetta, Actos, and Humilin 5 units qHS until she stopped her medications   - SSI and carbohydrate modified diet. - Patient started on steroids, blood sugar is elevated secondary to steroids. I will start Lantus and NovoLog with meals.  Hypertension  - Mildly elevated on admission  - Previously managed with Norvasc, Coreg, losartan, and Aldactone  - Will resume Coreg and losartan at low doses; this is  improved.  Confusion, memory loss  - Has been worsening since late 2016 and she has been under the care of outpatient neurology  - She is suspected to have  dementia and neurologist was planning to start some medications after obtaining MRI brain - Patient has reportedly refused the MRI, will order again see if she tolerates that.  - Reviewed outpatient records Dr. Marjory Lies note, has had disorientation, confusion, memory loss and want finding difficulties since December 2016. -Per palliative NP husband asking for evaluation, MRI, and RPR, TSH, B12, folate and ammonia to be done.  CAD - Underwent 4v CABG in 2014  - No anginal complaints; initial troponin wnl  - EKG with ST-changes likely secondary to LVH  - Monitor on telemetry and follow serial troponin  - Repeat EKG in am    Elevated lactic acid  - Lactate mildly elevated to 2.11 on admission, remaining 2.11 three hrs later after a 500 cc NS bolus was given  - Could be lower perfusion secondary to CHF, this is resolved.    ? UTI -Urinalysis with questioning UTI with cloudy appearance and few bacteria, also has low-grade fever 99. -Started on Rocephin empirically, urine culture showed no significant growth, I will discontinue Rocephin.  Acute kidney injury -Creatinine is slightly elevated at 1.02, could be secondary to Lasix and NSAIDs (indomethacin). -IV Lasix discontinued.   DVT prophylaxis:  Code Status: Full Code Family Communication:  Disposition Plan: Likely to SNF in a.m. if creatinine is stable. Diet: Diet heart healthy/carb modified Room service appropriate? Yes; Fluid consistency: Thin; Fluid restriction: 1500 mL Fluid  Consultants:   None  Procedures:   None  Antimicrobials:   None  Objective: Vitals:   10/28/15 1640 10/28/15 1900 10/29/15 0435 10/29/15 0815  BP:  (!) 128/55 136/65 (!) 149/78  Pulse: 78 70 69 75  Resp:  18 18   Temp:  97.8 F (36.6 C) 98.6 F (37 C) 97.8 F (36.6 C)  TempSrc:  Oral Oral Oral  SpO2:  98% 97% 98%  Weight:   93.5 kg (206 lb 2.1 oz)   Height:        Intake/Output Summary (Last 24 hours) at 10/29/15 1137 Last data  filed at 10/29/15 0800  Gross per 24 hour  Intake              100 ml  Output                0 ml  Net              100 ml   Filed Weights   10/26/15 2121 10/28/15 0601 10/29/15 0435  Weight: 93.8 kg (206 lb 12.7 oz) 91.5 kg (201 lb 11.5 oz) 93.5 kg (206 lb 2.1 oz)    Examination: General exam: Appears calm and comfortable  Respiratory system: Clear to auscultation. Respiratory effort normal. Cardiovascular system: S1 & S2 heard, RRR. No JVD, murmurs, rubs, gallops or clicks. No pedal edema. Gastrointestinal system: Abdomen is nondistended, soft and nontender. No organomegaly or masses felt. Normal bowel sounds heard. Central nervous system: Alert and oriented. No focal neurological deficits. Extremities: Symmetric 5 x 5 power. Skin: No rashes, lesions or ulcers Psychiatry: Judgement and insight appear normal. Mood & affect appropriate.   Data Reviewed: I have personally reviewed following labs and imaging studies  CBC:  Recent Labs Lab 10/26/15 1555  WBC 9.0  NEUTROABS 7.0  HGB 13.9  HCT 43.2  MCV 84.0  PLT 244   Basic  Metabolic Panel:  Recent Labs Lab 10/26/15 1555 10/27/15 0701 10/28/15 0224 10/29/15 0257  NA 135 136 136 135  K 3.9 3.4* 4.3 4.7  CL 103 103 103 100*  CO2 23 21* 26 25  GLUCOSE 269* 267* 285* 340*  BUN 11 11 13  25*  CREATININE 0.86 0.89 0.84 1.02*  CALCIUM 9.6 8.9 9.1 9.5  MG  --  2.0  --   --    GFR: Estimated Creatinine Clearance: 51.2 mL/min (by C-G formula based on SCr of 1.02 mg/dL). Liver Function Tests:  Recent Labs Lab 10/26/15 1555  AST 27  ALT 12*  ALKPHOS 75  BILITOT 0.9  PROT 8.2*  ALBUMIN 3.4*    Recent Labs Lab 10/26/15 1555  LIPASE 24   No results for input(s): AMMONIA in the last 168 hours. Coagulation Profile: No results for input(s): INR, PROTIME in the last 168 hours. Cardiac Enzymes:  Recent Labs Lab 10/27/15 0110 10/27/15 0701 10/27/15 1450  TROPONINI 0.06* 0.06* 0.06*   BNP (last 3 results) No  results for input(s): PROBNP in the last 8760 hours. HbA1C:  Recent Labs  10/27/15 0110  HGBA1C 9.9*   CBG:  Recent Labs Lab 10/28/15 1052 10/28/15 1624 10/28/15 2102 10/29/15 0626 10/29/15 1111  GLUCAP 339* 353* 364* 328* 307*   Lipid Profile: No results for input(s): CHOL, HDL, LDLCALC, TRIG, CHOLHDL, LDLDIRECT in the last 72 hours. Thyroid Function Tests: No results for input(s): TSH, T4TOTAL, FREET4, T3FREE, THYROIDAB in the last 72 hours. Anemia Panel: No results for input(s): VITAMINB12, FOLATE, FERRITIN, TIBC, IRON, RETICCTPCT in the last 72 hours. Urine analysis:    Component Value Date/Time   COLORURINE AMBER (A) 10/26/2015 1833   APPEARANCEUR CLOUDY (A) 10/26/2015 1833   LABSPEC 1.031 (H) 10/26/2015 1833   PHURINE 6.0 10/26/2015 1833   GLUCOSEU >1000 (A) 10/26/2015 1833   HGBUR SMALL (A) 10/26/2015 1833   BILIRUBINUR SMALL (A) 10/26/2015 1833   KETONESUR 40 (A) 10/26/2015 1833   PROTEINUR 100 (A) 10/26/2015 1833   UROBILINOGEN 1.0 07/09/2012 2227   NITRITE NEGATIVE 10/26/2015 1833   LEUKOCYTESUR NEGATIVE 10/26/2015 1833   Sepsis Labs: @LABRCNTIP (procalcitonin:4,lacticidven:4)  ) Recent Results (from the past 240 hour(s))  Culture, Urine     Status: Abnormal   Collection Time: 10/26/15  6:33 PM  Result Value Ref Range Status   Specimen Description URINE, RANDOM  Final   Special Requests ADDED 0121 10/27/15  Final   Culture <10,000 COLONIES/mL INSIGNIFICANT GROWTH (A)  Final   Report Status 10/28/2015 FINAL  Final     Invalid input(s): PROCALCITONIN, LACTICACIDVEN   Radiology Studies: Ct Hip Left Wo Contrast  Result Date: 10/27/2015 CLINICAL DATA:  BILATERAL hip pain EXAM: CT OF THE LEFT HIP WITHOUT CONTRAST CT OF THE RIGHT HIP WITHOUT CONTRAST TECHNIQUE: Multidetector CT imaging of the left hip was performed according to the standard protocol. Multiplanar CT image reconstructions were also generated. COMPARISON:  Pelvic and LEFT hip radiographs  10/27/2015 FINDINGS: Bones/Joint/Cartilage Hip and SI joints symmetric and preserved. Sclerotic focus LEFT ilium adjacent SI joint 15 mm greatest size. No fracture, dislocation, or bone destruction. Significant multifactorial spinal stenosis at L4-L5 due to bulging disc, facet hypertrophy and ligamentum flavum thickening. Bulging disc versus broad-based disc herniation at L5-S1. Ligaments Suboptimally assessed by CT. Muscles and Tendons Symmetric muscular planes. Soft tissues Scattered atherosclerotic calcifications bilaterally. Visualized portion of appendix normal. Minimal sigmoid diverticulosis; bowel loops otherwise grossly normal for exam lacking IV and oral contrast. Normal appearing bladder, distal ureters,  uterus. No free air or free fluid in pelvis. IMPRESSION: Severe multifactorial central acquired spinal stenosis at L4-L5. Bulging tears versus broad-based disc herniation L5-S1 indenting thecal sac. Sclerotic focus that LEFT ilium adjacent to the SI joint, very dense and sharply defined, favor bone island. No definite acute osseous abnormalities. Minimal sigmoid diverticulosis. Electronically Signed   By: Ulyses Southward M.D.   On: 10/27/2015 20:58   Ct Hip Right Wo Contrast  Result Date: 10/27/2015 CLINICAL DATA:  BILATERAL hip pain EXAM: CT OF THE LEFT HIP WITHOUT CONTRAST CT OF THE RIGHT HIP WITHOUT CONTRAST TECHNIQUE: Multidetector CT imaging of the left hip was performed according to the standard protocol. Multiplanar CT image reconstructions were also generated. COMPARISON:  Pelvic and LEFT hip radiographs 10/27/2015 FINDINGS: Bones/Joint/Cartilage Hip and SI joints symmetric and preserved. Sclerotic focus LEFT ilium adjacent SI joint 15 mm greatest size. No fracture, dislocation, or bone destruction. Significant multifactorial spinal stenosis at L4-L5 due to bulging disc, facet hypertrophy and ligamentum flavum thickening. Bulging disc versus broad-based disc herniation at L5-S1. Ligaments  Suboptimally assessed by CT. Muscles and Tendons Symmetric muscular planes. Soft tissues Scattered atherosclerotic calcifications bilaterally. Visualized portion of appendix normal. Minimal sigmoid diverticulosis; bowel loops otherwise grossly normal for exam lacking IV and oral contrast. Normal appearing bladder, distal ureters, uterus. No free air or free fluid in pelvis. IMPRESSION: Severe multifactorial central acquired spinal stenosis at L4-L5. Bulging tears versus broad-based disc herniation L5-S1 indenting thecal sac. Sclerotic focus that LEFT ilium adjacent to the SI joint, very dense and sharply defined, favor bone island. No definite acute osseous abnormalities. Minimal sigmoid diverticulosis. Electronically Signed   By: Ulyses Southward M.D.   On: 10/27/2015 20:58   Dg Hip Unilat With Pelvis 2-3 Views Left  Result Date: 10/27/2015 CLINICAL DATA:  Left leg edema EXAM: DG HIP (WITH OR WITHOUT PELVIS) 2-3V LEFT COMPARISON:  None. FINDINGS: No fracture or dislocation is seen. Bilateral hip joint spaces are preserved. Visualized bony pelvis appears intact. IMPRESSION: No acute osseus abnormality is seen. Electronically Signed   By: Charline Bills M.D.   On: 10/27/2015 14:41        Scheduled Meds: . aspirin  81 mg Oral Daily  . carvedilol  3.125 mg Oral BID WC  . cefTRIAXone (ROCEPHIN)  IV  1 g Intravenous Q24H  . colchicine  0.6 mg Oral Daily  . diclofenac sodium  2 g Topical QID  . heparin  5,000 Units Subcutaneous Q8H  . insulin aspart  0-9 Units Subcutaneous TID WC  . insulin glargine  20 Units Subcutaneous QHS  . losartan  25 mg Oral Daily  . multivitamin with minerals  1 tablet Oral Daily  . olopatadine  1 drop Both Eyes BID  . predniSONE  10 mg Oral Q breakfast  . rosuvastatin  40 mg Oral Daily  . sodium chloride flush  3 mL Intravenous Q12H  . vitamin B-12  1,000 mcg Oral Daily  . [START ON 10/30/2015] Vitamin D (Ergocalciferol)  50,000 Units Oral Q Wed   Continuous Infusions:     LOS: 1 day    Time spent: 35 minutes    Jullia Mulligan A, MD [facility] Pager [phone]  If 7PM-7AM, please contact night-coverage [email or web address] Password [email or web address] 10/29/2015, 11:37 AM

## 2015-10-29 NOTE — Progress Notes (Signed)
Inpatient Diabetes Program Recommendations  AACE/ADA: New Consensus Statement on Inpatient Glycemic Control (2015)  Target Ranges:  Prepandial:   less than 140 mg/dL      Peak postprandial:   less than 180 mg/dL (1-2 hours)      Critically ill patients:  140 - 180 mg/dL  Results for EDYE, MOHABIR (MRN [id number]) as of 10/29/2015 11:42  Ref. Range 10/27/2015 01:10  Hemoglobin A1C Latest Ref Range: 4.8 - 5.6 % 9.9 (H)   Results for ANNAJO, PETREE (MRN [id number]) as of 10/29/2015 11:42  Ref. Range 10/27/2015 16:23 10/27/2015 21:01 10/28/2015 06:00 10/28/2015 10:52 10/28/2015 16:24 10/28/2015 21:02 10/29/2015 06:26 10/29/2015 11:11  Glucose-Capillary Latest Ref Range: 65 - 99 mg/dL 283 (H) 151 (H) 761 (H) 339 (H) 353 (H) 364 (H) 328 (H) 307 (H)    Review of Glycemic Control  Diabetes history: DM2 Outpatient Diabetes medications: None - Byetta 10 mcg bid, amaryl 2 mg Q breakfast, NPH 5 units QHS, metformin 500 mg bid, Actos 30 QD. Current orders for Inpatient glycemic control: Lantus 20 units QHS, Novolog sensitive tidwc  Inpatient Diabetes Program Recommendations:  Noted patient now on Prednisone 10 mg po daily. Please consider: -Adding 3-4 units Novolog meal coverage tid -Increase in Novolog correction to moderate 0-15 units tid with 0-5 units hs  Thank you, Darel Hong E. Debie Ashline, RN, MSN, CDE Inpatient Glycemic Control Team Team Pager [phone] (8am-5pm) 10/29/2015 11:44 AM

## 2015-10-29 NOTE — Clinical Social Work Note (Signed)
CSW met with patient. Husband and another support at bedside. CSW provided bed offers. Patient's husband does not want to make a SNF decision or have her discharge until MD does CT or MRI to figure out what is wrong. CSW advised patient's husband to discuss with MD when he makes his rounds. MD has not been by today yet.  Dayton Scrape, [place]

## 2015-10-30 ENCOUNTER — Encounter (HOSPITAL_COMMUNITY): Payer: Self-pay | Admitting: Internal Medicine

## 2015-10-30 DIAGNOSIS — F03918 Unspecified dementia, unspecified severity, with other behavioral disturbance: Secondary | ICD-10-CM

## 2015-10-30 DIAGNOSIS — I2510 Atherosclerotic heart disease of native coronary artery without angina pectoris: Secondary | ICD-10-CM

## 2015-10-30 DIAGNOSIS — Z794 Long term (current) use of insulin: Secondary | ICD-10-CM

## 2015-10-30 DIAGNOSIS — E119 Type 2 diabetes mellitus without complications: Secondary | ICD-10-CM

## 2015-10-30 DIAGNOSIS — R609 Edema, unspecified: Secondary | ICD-10-CM

## 2015-10-30 DIAGNOSIS — F0390 Unspecified dementia without behavioral disturbance: Secondary | ICD-10-CM

## 2015-10-30 DIAGNOSIS — I5023 Acute on chronic systolic (congestive) heart failure: Secondary | ICD-10-CM

## 2015-10-30 DIAGNOSIS — F0391 Unspecified dementia with behavioral disturbance: Secondary | ICD-10-CM

## 2015-10-30 DIAGNOSIS — Z7189 Other specified counseling: Secondary | ICD-10-CM

## 2015-10-30 DIAGNOSIS — Z515 Encounter for palliative care: Secondary | ICD-10-CM

## 2015-10-30 DIAGNOSIS — F03B0 Unspecified dementia, moderate, without behavioral disturbance, psychotic disturbance, mood disturbance, and anxiety: Secondary | ICD-10-CM

## 2015-10-30 DIAGNOSIS — I10 Essential (primary) hypertension: Secondary | ICD-10-CM

## 2015-10-30 HISTORY — DX: Unspecified dementia, moderate, without behavioral disturbance, psychotic disturbance, mood disturbance, and anxiety: F03.B0

## 2015-10-30 HISTORY — DX: Unspecified dementia without behavioral disturbance: F03.90

## 2015-10-30 LAB — GLUCOSE, CAPILLARY
GLUCOSE-CAPILLARY: 129 mg/dL — AB (ref 65–99)
GLUCOSE-CAPILLARY: 134 mg/dL — AB (ref 65–99)
Glucose-Capillary: 70 mg/dL (ref 65–99)
Glucose-Capillary: 145 mg/dL — ABNORMAL HIGH (ref 65–99)

## 2015-10-30 LAB — BASIC METABOLIC PANEL
ANION GAP: 13 (ref 5–15)
BUN: 24 mg/dL — AB (ref 6–20)
CALCIUM: 9 mg/dL (ref 8.9–10.3)
CO2: 26 mmol/L (ref 22–32)
Chloride: 98 mmol/L — ABNORMAL LOW (ref 101–111)
Creatinine, Ser: 0.88 mg/dL (ref 0.44–1.00)
GFR calc Af Amer: >60 mL/min (ref >60)
GLUCOSE: 141 mg/dL — AB (ref 65–99)
Potassium: 3.3 mmol/L — ABNORMAL LOW (ref 3.5–5.1)
SODIUM: 137 mmol/L (ref 135–145)

## 2015-10-30 LAB — RPR: RPR: NONREACTIVE

## 2015-10-30 MED ORDER — LOSARTAN POTASSIUM 25 MG PO TABS: 25.0000 mg | ORAL_TABLET | Freq: Every day | ORAL | 2 refills | Status: DC

## 2015-10-30 MED ORDER — TRAMADOL HCL 50 MG PO TABS: 25.0000 mg | ORAL_TABLET | Freq: Four times a day (QID) | ORAL | 0 refills | Status: DC | PRN

## 2015-10-30 MED ORDER — POTASSIUM CHLORIDE CRYS ER 20 MEQ PO TBCR
40.0000 meq | EXTENDED_RELEASE_TABLET | Freq: Once | ORAL | Status: AC
  Administered 2015-10-30: 40 meq via ORAL
  Filled 2015-10-30: qty 2

## 2015-10-30 MED ORDER — LORAZEPAM 0.5 MG PO TABS: 0.5000 mg | ORAL_TABLET | Freq: Three times a day (TID) | ORAL | 0 refills | Status: DC | PRN

## 2015-10-30 MED ORDER — LORAZEPAM 2 MG/ML IJ SOLN: 0.5000 mg | Freq: Three times a day (TID) | INTRAMUSCULAR | Status: DC | PRN

## 2015-10-30 MED ORDER — CARVEDILOL 3.125 MG PO TABS: 3.1250 mg | ORAL_TABLET | Freq: Two times a day (BID) | ORAL | 2 refills | Status: DC

## 2015-10-30 MED ORDER — SPIRONOLACTONE 25 MG PO TABS: 12.5000 mg | ORAL_TABLET | Freq: Every day | ORAL | 0 refills | Status: DC

## 2015-10-30 MED ORDER — PREDNISONE 10 MG PO TABS: 10.0000 mg | ORAL_TABLET | Freq: Every day | ORAL | 0 refills | Status: DC

## 2015-10-30 MED ORDER — DEXTROSE 50 % IV SOLN
INTRAVENOUS | Status: AC
  Administered 2015-10-30: 25 mL
  Filled 2015-10-30: qty 50

## 2015-10-30 NOTE — Progress Notes (Signed)
Pt picked up by [facility]. Attempted to call report. No answer.

## 2015-10-30 NOTE — Progress Notes (Signed)
Inpatient Diabetes Program Recommendations  AACE/ADA: New Consensus Statement on Inpatient Glycemic Control (2015)  Target Ranges:  Prepandial:   less than 140 mg/dL      Peak postprandial:   less than 180 mg/dL (1-2 hours)      Critically ill patients:  140 - 180 mg/dL   Review of Glycemic Control  Inpatient Diabetes Program Recommendations:  Noted CBGs.  Please consider decrease in Lantus to 20 units daily.  Thank you, Ashlee [name]. Ayesha Markwell, RN, MSN, CDE Inpatient Glycemic Control Team Team Pager [phone] (8am-5pm) 10/30/2015 2:41 PM

## 2015-10-30 NOTE — Clinical Social Work Placement (Signed)
   CLINICAL SOCIAL WORK PLACEMENT  NOTE  Date:  10/30/2015  Patient Details  Name: Ashlee [name] MRN: [id number] Date of Birth: [birth year]  Clinical Social Work is seeking post-discharge placement for this patient at the Skilled  Nursing Facility level of care (*CSW will initial, date and re-position this form in  chart as items are completed):  Yes   Patient/family provided with [facility] Clinical Social Work Department's list of facilities offering this level of care within the geographic area requested by the patient (or if unable, by the patient's family).  Yes   Patient/family informed of their freedom to choose among providers that offer the needed level of care, that participate in Medicare, Medicaid or managed care program needed by the patient, have an available bed and are willing to accept the patient.  Yes   Patient/family informed of [facility]'s ownership interest in [facility] and [facility], as well as of the fact that they are under no obligation to receive care at these facilities.  PASRR submitted to EDS on 10/28/15     PASRR number received on 10/28/15     Existing PASRR number confirmed on       FL2 transmitted to all facilities in geographic area requested by pt/family on 10/28/15     FL2 transmitted to all facilities within larger geographic area on       Patient informed that his/her managed care company has contracts with or will negotiate with certain facilities, including the following:        Yes   Patient/family informed of bed offers received.  Patient chooses bed at [facility]     Physician recommends and patient chooses bed at      Patient to be transferred to [facility] on 10/30/15.  Patient to be transferred to facility by [facility]     Patient family notified on 10/30/15 of transfer.  Name of family member notified:  [name] at bedside     PHYSICIAN       Additional Comment:     _______________________________________________ Mearl Latin, LCSWA 10/30/2015, 3:25 PM

## 2015-10-30 NOTE — Progress Notes (Signed)
[facility]: [facility] Anticipated DC date: 10/30/15 Family notified: Spouse Transport by: Sharin Mons 4:30pm   Per MD patient ready for DC to [facility]. RN, patient, patient's family, and facility notified of DC. RN given number for report. DC packet on chart. Ambulance transport requested for patient.   CSW signing off.  Cristobal Goldmann, [place] Clinical Social Worker [phone]

## 2015-10-30 NOTE — Progress Notes (Addendum)
Hypoglycemic Event  CBG: 70  Treatment: Dextrose 50% 25 ml   Symptoms: None  Follow-up CBG: Time: CBG Result:  Possible Reasons for Event: Inadequate meal intake  Comments/MD notified:yes    Ashlee [name], [employer]

## 2015-10-30 NOTE — Progress Notes (Signed)
Pt has been trying to get out of bed all night. She is confused. She has tried to get out of bed 3 times in the past 10 minutes. Pt is a high falls risk. We have reoriented pt to room and given her an activity board but it is not working. Ativan was given for pt safety and to help calm her down. Will continue to monitor pt closely.

## 2015-10-30 NOTE — Discharge Summary (Signed)
Physician Discharge Summary  Ashlee [name] ZOX:[id number] DOB: [birth year] DOA: 10/26/2015  PCP: Junious Silk, MD  Admit date: 10/26/2015 Discharge date: 10/30/2015  Time spent: 65 minutes  Recommendations for Outpatient Follow-up:  1. Patient will be discharged to skilled nursing facility. Patient will follow-up with M.D. at the skilled nursing facility. Patient will need a basic metabolic profile done to follow-up on electrolytes and renal function. Palliative care to follow patient at skilled nursing facility. 2. Follow-up with Dr Marjory Lies, neurology as previously scheduled.   Discharge Diagnoses:  Principal Problem:   Acute on chronic systolic CHF (congestive heart failure) (HCC) Active Problems:   Moderate dementia without behavioral disturbance   CAD (coronary artery disease)   Hypertension   Insulin dependent diabetes mellitus (HCC)   ICD (implantable cardioverter-defibrillator) in place   Chronic confusion   Elevated lactic acid level   Dyspnea   Palliative care encounter   Dementia with behavioral disturbance   Advance care planning   Goals of care, counseling/discussion   Palliative care by specialist   Do not resuscitate discussion   Peripheral edema   Discharge Condition: Stable  Diet recommendation: Heart healthy  Filed Weights   10/28/15 0601 10/29/15 0435 10/30/15 0644  Weight: 91.5 kg (201 lb 11.5 oz) 93.5 kg (206 lb 2.1 oz) 92.1 kg (203 lb 0.7 oz)    History of present illness:  Per Dr Verline Lema is a 75 y.o. female with medical history significant for chronic systolic CHF, coronary artery disease with stent, insulin-dependent diabetes mellitus, hypertension, and memory loss with confusion followed by neurology in the outpatient setting who presents to the emergency department with worsening confusion and bilateral lower extremity edema. History is obtained through discussion with the ED personnel, review of the EMR, and discussion with the  patient's husband and first cousin at bedside. Patient significantly confused and unable to contribute to the history. Patient reportedly began developing confusion and memory loss towards the end of 2016 and has been under the evaluation of her neurologist. MRI brain was ordered back in February for evaluation of this, but the patient has reportedly refused to have this study performed. Given the worsening confusion and memory problems, the neurologist had come to an agreement with the patient's husband that he would take over her medication management. Unfortunately, the patient has not taken any of her medications since that time, and per the report of the patient's cousin at the bedside, her husband seems to have given up on her. Patient is no longer taking her insulin or any of her medications. She reportedly developed swelling in the bilateral lower extremities yesterday. She is also reported to have increasing confusion, but this seems to be a slow and progressive worsening over the past 10 months or so. There has been no acute change in her mental status.  ED Course: Upon arrival to the ED, patient is found to be afebrile, saturating well on room air, and with vital signs stable. EKG demonstrated sinus rhythm with LVH by voltage criteria and secondary polarization abnormality with associated ST changes in the anterior leads. Chest x-ray was negative for acute cardiopulmonary disease. Chemistry panel was notable for a serum glucose of 269 and CBC is unremarkable. Troponin is within the normal limits and BNP is elevated to 505. Lactic acid is mildly elevated to 0.11 and remains 2.11 on repeat 3 hours later. Urinalysis was obtained, it is notable for greater than 1000 glucose, 40 ketones, small hemoglobin, and 100 protein. There  is also an elevation in specific gravity, but the UA was not suggestive of infection. Patient was given an empiric dose of Rocephin for a suspected UTI and a 500 cc normal saline  bolus in the ED. She had remained hemodynamically stable and in no acute respiratory distress. She'll be observed on telemetry unit for ongoing evaluation and management of acute on chronic systolic CHF in the setting of medical noncompliance.   Hospital Course:  Acute on chronic systolic CHF  - Presented with peripheral edema, BNP 505, and some mild respiratory distress  - Right leg swelling more pronounced than left; venous doppler was negative for DVT.  - TTE (08/01/2013) with EF 30%, diffuse HK, mild LVH, mild TR  - Reportedly nonadherent to medication secondary to memory problems. - 2-D echo showed LVEF of 33-35% which is unchanged from prior 2-D echo. Patient was restarted on Coreg and Cozaar at a lower dose. Patient was diuresed with IV Lasix with resolution of lower extremity edema. Patient be discharged home on lower dose of Coreg, Cozaar and lower dose of spironolactone. Outpatient follow-up.  Arthritis, bilateral knee pain left more than right -Appears to have severe arthritis with periarticular joint swelling and warmth. -Empirically started on steroids, colchicine and NSAIDs. -Uric acid was normal, and as such discontinued indomethacin and colchicine. Patient was continued on prednisone and will be discharged on steroid taper.   Elevated troponin -Slightly elevated troponin at 0.06, this is likely secondary to CHF, 2-D echo was done with a EF of 30-35% with mid to distal anterior, anteroseptal, apical and inferior severe hypokinesis to akinesis which was unchanged from prior 2-D echo. Patient was resumed back on Coreg and losartan. Spironolactone will be added on discharge at a low dose.   Insulin-dependent DM, uncontrolled  - A1c 6.3% in 2014 , A1c is 9.9 - She had been managed with glimepiride, Byetta, Actos, and Humilin 5 units qHS until she stopped her medications  - SSI and carbohydrate modified diet. - Patient started on steroids, blood sugar is elevated secondary to  steroids, and a such patient was placed on Lantus and NovoLog with meals. Patient will be discharged back home on a Byetta as well as NPH. Patient's Actos has been discontinued secondary to CHF. Outpatient follow-up.  Hypertension  - Mildly elevated on admission  - Previously managed with Norvasc, Coreg, losartan, and Aldactone  - Patient was resumed on lower dose of Coreg and losartan with improvement with hypertension. Patient be discharged home on this regimen including low dose spironolactone 12.5 mg daily. Outpatient follow-up.   Confusion, memory loss/moderate dementia without behavioral disturbance - Has been worsening since late 2016 and she has been under the care of outpatient neurology  - She is suspected to have moderate dementia and neurologist was planning to start some medications after obtaining MRI brain - Patient had reportedly refused the MRI. Unable to obtain MRI June this hospitalization as patient noted to have ICD. Patient did not have any neurological deficits. - Reviewed outpatient records Dr. Marjory Lies note, has had disorientation, confusion, memory loss and word finding difficulties since December 2016. -Per palliative NP husband asking for evaluation, MRI, and RPR, TSH, B12, folate and ammonia to be done. MRI was unable to be done as patient had ICD. RPR was nonreactive. TSH was within normal limits of 0.638. Folate was 18.4. Ammonia level was 28. B-12 levels was elevated at 3020 and a such patient's B-12 supplementation has been discontinued. -Patient likely with her worsening dementia and will  need to follow-up with neurology as outpatient. -Patient was seen by palliative care during the hospitalization and was recommended that patient be followed by palliative care and outpatient setting.  CAD - Underwent 4v CABG in 2014  - No anginal complaints; initial troponin wnl  - EKG with ST-changes likely secondary to LVH  - Monitored on telemetry -Patient was resumed  back on Coreg and Cozaar as patient had discontinued these in home. 2-D echo obtained had a EF of 30-35% with mid to distal anterior, apical, septal and inferior severe hypokinesis to akinesis. Diastolic dysfunction. 2-D echo unchanged from prior 2-D echo from 08/11/2013. Outpatient follow-up.  Elevated lactic acid  - Lactate mildly elevated to 2.11 on admission, remaining 2.11 three hrs later after a 500 cc NS bolus was given  - Could be lower perfusion secondary to CHF, which resolved. Lactic acid level went down to 1.0.   Bacteruria -Urinalysis with questioning UTI with cloudy appearance and few bacteria, also has low-grade fever 99. -Started on Rocephin empirically, urine culture showed no significant growth, and a such IV antibiotics were discontinued.   Acute kidney injury -Creatinine was slightly elevated at 1.02, during the hospitalization could be secondary to Lasix and NSAIDs (indomethacin). Patient had been on IV Lasix which was subsequently discontinued with improvement in renal function such that by day of discharge creatinine was back down to 0.88.  Severe multifocal central acquired spinal stenosis at L4-5/tears versus broad-based disc herniation L5-S1 indenting thecal sac/sclerotic focus left ilium Noted on CT of bilateral hips. Patient with a worsening dementia with palliative care following at facility. Conservative management for now. May need to follow-up with neurosurgery in the outpatient setting if worsening symptoms. Patient however remained asymptomatic during the hospitalization.    Procedures:  CT bilateral hips 10/27/2015  Plain films of the left hip and pelvis 10/27/2015  Chest x-ray 10/26/2015  2-D echo 10/27/2015  Lower extremity Dopplers 10/26/2015  Consultations:  Palliative care: Eduard Roux, NP 10/27/2015  Discharge Exam: Vitals:   10/30/15 1051 10/30/15 1254  BP: (!) 152/67 (!) 113/47  Pulse: 68 73  Resp:  18  Temp:  97.1 F (36.2 C)     General: NAD. Pleasantly confused. Cardiovascular: RRR Respiratory: CTAB  Discharge Instructions   Discharge Instructions    Diet - low sodium heart healthy    Complete by:  As directed   Increase activity slowly    Complete by:  As directed     Current Discharge Medication List    START taking these medications   Details  LORazepam (ATIVAN) 0.5 MG tablet Take 1 tablet (0.5 mg total) by mouth every 8 (eight) hours as needed for anxiety. Qty: 10 tablet, Refills: 0    predniSONE (DELTASONE) 10 MG tablet Take 1-5 tablets (10-50 mg total) by mouth daily with breakfast. Take 5 tablets ( 50mg ) daily x 2 days, then 4 tablets (40mg ) daily x 3 days, then 3 tablets (30mg ) daily x 3 days, then 2 tablets (20mg ) daily x 3 days, then 1 tablet (10mg ) daily x 3 days then stop. Qty: 40 tablet, Refills: 0    traMADol (ULTRAM) 50 MG tablet Take 0.5 tablets (25 mg total) by mouth every 6 (six) hours as needed for moderate pain. Qty: 10 tablet, Refills: 0      CONTINUE these medications which have CHANGED   Details  carvedilol (COREG) 3.125 MG tablet Take 1 tablet (3.125 mg total) by mouth 2 (two) times daily with a meal. Qty: 60 tablet, Refills:  2    losartan (COZAAR) 25 MG tablet Take 1 tablet (25 mg total) by mouth daily. Qty: 30 tablet, Refills: 2    spironolactone (ALDACTONE) 25 MG tablet Take 0.5 tablets (12.5 mg total) by mouth daily. Qty: 30 tablet, Refills: 0      CONTINUE these medications which have NOT CHANGED   Details  aspirin 81 MG tablet Take 81 mg by mouth daily.    BEPREVE 1.5 % SOLN Place 1 drop into both eyes daily as needed (EYES).     diclofenac sodium (VOLTAREN) 1 % GEL Apply 2 g topically 4 (four) times daily. Qty: 100 Tube, Refills: 1   Associated Diagnoses: Achilles bursitis or tendinitis    ergocalciferol (VITAMIN D2) 50000 UNITS capsule Take 50,000 Units by mouth every Wednesday.     exenatide (BYETTA 10 MCG PEN) 10 MCG/0.04ML SOLN Inject 10 mcg into the  skin 2 (two) times daily with a meal. bid    Ferrous Sulfate (IRON) 325 (65 FE) MG TABS Take 1 tablet by mouth daily.    glimepiride (AMARYL) 4 MG tablet Take 2 mg by mouth daily with breakfast.    glucose blood (SMARTEST TEST) test strip 1 each by Other route 2 (two) times daily. Use as instructed    insulin NPH (HUMULIN N,NOVOLIN N) 100 UNIT/ML injection Inject 5 Units into the skin at bedtime.     metFORMIN (GLUCOPHAGE) 1000 MG tablet Take 500 mg by mouth 2 (two) times daily with a meal. Per pt Dr. Emeline GeneralAlthemier instructed to decrease dosage to 500 mg twice a day    Multiple Vitamins-Minerals (CENTRUM SILVER ADULT 50+ PO) Take 1 tablet by mouth daily.    rosuvastatin (CRESTOR) 40 MG tablet Take 1 tablet (40 mg total) by mouth daily. Qty: 90 tablet, Refills: 3   Associated Diagnoses: Hyperlipidemia      STOP taking these medications     amLODipine (NORVASC) 5 MG tablet      pioglitazone (ACTOS) 30 MG tablet      vitamin B-12 (CYANOCOBALAMIN) 1000 MCG tablet        Allergies  Allergen Reactions  . Demerol Nausea And Vomiting    Perfuse vomitting  . Percodan [Oxycodone-Aspirin] Nausea And Vomiting    Perfuse vomitting   Follow-up Information    MD AT SNF .   Why:  F/U WITH MD AT SNF       Palliative Care .   Why:  [facility]       PENUMALLI,VIKRAM, MD .   Specialties:  Neurology, Radiology Why:  f/u as scheduled. Contact information: [address] [place] [address] [phone]            The results of significant diagnostics from this hospitalization (including imaging, microbiology, ancillary and laboratory) are listed below for reference.    Significant Diagnostic Studies: Dg Chest 2 View  Result Date: 10/26/2015 CLINICAL DATA:  Altered mental status.  Coronary artery disease. EXAM: CHEST  2 VIEW COMPARISON:  09/29/2013 FINDINGS: Heart size is stable. Aortic atherosclerosis. AICD remains in appropriate position.  Prior CABG again noted. There left basilar scarring and elevation of left hemidiaphragm are stable. No evidence of acute infiltrate or pulmonary edema. No evidence of pleural effusion or pneumothorax. IMPRESSION: Stable left basilar scarring.  No acute findings. Electronically Signed   By: Myles RosenthalJohn  Stahl M.D.   On: 10/26/2015 16:51   Ct Hip Left Wo Contrast  Result Date: 10/27/2015 CLINICAL DATA:  BILATERAL hip  pain EXAM: CT OF THE LEFT HIP WITHOUT CONTRAST CT OF THE RIGHT HIP WITHOUT CONTRAST TECHNIQUE: Multidetector CT imaging of the left hip was performed according to the standard protocol. Multiplanar CT image reconstructions were also generated. COMPARISON:  Pelvic and LEFT hip radiographs 10/27/2015 FINDINGS: Bones/Joint/Cartilage Hip and SI joints symmetric and preserved. Sclerotic focus LEFT ilium adjacent SI joint 15 mm greatest size. No fracture, dislocation, or bone destruction. Significant multifactorial spinal stenosis at L4-L5 due to bulging disc, facet hypertrophy and ligamentum flavum thickening. Bulging disc versus broad-based disc herniation at L5-S1. Ligaments Suboptimally assessed by CT. Muscles and Tendons Symmetric muscular planes. Soft tissues Scattered atherosclerotic calcifications bilaterally. Visualized portion of appendix normal. Minimal sigmoid diverticulosis; bowel loops otherwise grossly normal for exam lacking IV and oral contrast. Normal appearing bladder, distal ureters, uterus. No free air or free fluid in pelvis. IMPRESSION: Severe multifactorial central acquired spinal stenosis at L4-L5. Bulging tears versus broad-based disc herniation L5-S1 indenting thecal sac. Sclerotic focus that LEFT ilium adjacent to the SI joint, very dense and sharply defined, favor bone island. No definite acute osseous abnormalities. Minimal sigmoid diverticulosis. Electronically Signed   By: Ulyses Southward M.D.   On: 10/27/2015 20:58   Ct Hip Right Wo Contrast  Result Date: 10/27/2015 CLINICAL DATA:   BILATERAL hip pain EXAM: CT OF THE LEFT HIP WITHOUT CONTRAST CT OF THE RIGHT HIP WITHOUT CONTRAST TECHNIQUE: Multidetector CT imaging of the left hip was performed according to the standard protocol. Multiplanar CT image reconstructions were also generated. COMPARISON:  Pelvic and LEFT hip radiographs 10/27/2015 FINDINGS: Bones/Joint/Cartilage Hip and SI joints symmetric and preserved. Sclerotic focus LEFT ilium adjacent SI joint 15 mm greatest size. No fracture, dislocation, or bone destruction. Significant multifactorial spinal stenosis at L4-L5 due to bulging disc, facet hypertrophy and ligamentum flavum thickening. Bulging disc versus broad-based disc herniation at L5-S1. Ligaments Suboptimally assessed by CT. Muscles and Tendons Symmetric muscular planes. Soft tissues Scattered atherosclerotic calcifications bilaterally. Visualized portion of appendix normal. Minimal sigmoid diverticulosis; bowel loops otherwise grossly normal for exam lacking IV and oral contrast. Normal appearing bladder, distal ureters, uterus. No free air or free fluid in pelvis. IMPRESSION: Severe multifactorial central acquired spinal stenosis at L4-L5. Bulging tears versus broad-based disc herniation L5-S1 indenting thecal sac. Sclerotic focus that LEFT ilium adjacent to the SI joint, very dense and sharply defined, favor bone island. No definite acute osseous abnormalities. Minimal sigmoid diverticulosis. Electronically Signed   By: Ulyses Southward M.D.   On: 10/27/2015 20:58   Dg Hip Unilat With Pelvis 2-3 Views Left  Result Date: 10/27/2015 CLINICAL DATA:  Left leg edema EXAM: DG HIP (WITH OR WITHOUT PELVIS) 2-3V LEFT COMPARISON:  None. FINDINGS: No fracture or dislocation is seen. Bilateral hip joint spaces are preserved. Visualized bony pelvis appears intact. IMPRESSION: No acute osseus abnormality is seen. Electronically Signed   By: Charline Bills M.D.   On: 10/27/2015 14:41    Microbiology: Recent Results (from the past 240  hour(s))  Culture, Urine     Status: Abnormal   Collection Time: 10/26/15  6:33 PM  Result Value Ref Range Status   Specimen Description URINE, RANDOM  Final   Special Requests ADDED 0121 10/27/15  Final   Culture <10,000 COLONIES/mL INSIGNIFICANT GROWTH (A)  Final   Report Status 10/28/2015 FINAL  Final     Labs: Basic Metabolic Panel:  Recent Labs Lab 10/26/15 1555 10/27/15 0701 10/28/15 0224 10/29/15 0257 10/30/15 0702  NA 135 136 136 135 137  K 3.9 3.4* 4.3 4.7 3.3*  CL 103 103 103 100* 98*  CO2 23 21* 26 25 26   GLUCOSE 269* 267* 285* 340* 141*  BUN 11 11 13  25* 24*  CREATININE 0.86 0.89 0.84 1.02* 0.88  CALCIUM 9.6 8.9 9.1 9.5 9.0  MG  --  2.0  --   --   --    Liver Function Tests:  Recent Labs Lab 10/26/15 1555  AST 27  ALT 12*  ALKPHOS 75  BILITOT 0.9  PROT 8.2*  ALBUMIN 3.4*    Recent Labs Lab 10/26/15 1555  LIPASE 24    Recent Labs Lab 10/29/15 1528  AMMONIA 28   CBC:  Recent Labs Lab 10/26/15 1555  WBC 9.0  NEUTROABS 7.0  HGB 13.9  HCT 43.2  MCV 84.0  PLT 244   Cardiac Enzymes:  Recent Labs Lab 10/27/15 0110 10/27/15 0701 10/27/15 1450  TROPONINI 0.06* 0.06* 0.06*   BNP: BNP (last 3 results)  Recent Labs  10/26/15 1555  BNP 505.5*    ProBNP (last 3 results) No results for input(s): PROBNP in the last 8760 hours.  CBG:  Recent Labs Lab 10/29/15 1627 10/29/15 2045 10/30/15 0617 10/30/15 0705 10/30/15 1057  GLUCAP 253* 130* 70 145* 129*       Signed:  Ezriel Boffa MD.  [facility] 10/30/2015, 3:13 PM

## 2015-10-30 NOTE — Progress Notes (Signed)
Daily Progress Note   Patient Name: Ashlee [name]       Date: 10/30/2015 DOB: 01/20/1941  Age: 75 y.o. MRN#: [id number] Attending Physician: Ashlee Filler, MD Primary Care Physician: Ashlee Patricia, MD Admit Date: 10/26/2015  Reason for Consultation/Follow-up: Disposition and Establishing goals of care  Subjective: Met with patient's spouse, Ashlee [name] at bedside. Ashlee [name] is acceptable to discharging patient to SNF when bed is available. He agreed to DNR order and he agreed to palliative followup at SNF for continued [place] conversation.   Unable to do ROS d/t patient mental status.  Length of Stay: 2  Current Medications: Scheduled Meds:  . aspirin  81 mg Oral Daily  . carvedilol  3.125 mg Oral BID WC  . diclofenac sodium  2 g Topical QID  . heparin  5,000 Units Subcutaneous Q8H  . insulin aspart  0-9 Units Subcutaneous TID WC  . insulin aspart  5 Units Subcutaneous TID WC  . insulin glargine  20 Units Subcutaneous BID  . losartan  25 mg Oral Daily  . multivitamin with minerals  1 tablet Oral Daily  . olopatadine  1 drop Both Eyes BID  . pneumococcal 23 valent vaccine  0.5 mL Intramuscular Tomorrow-1000  . predniSONE  50 mg Oral Q breakfast  . rosuvastatin  40 mg Oral Daily  . sodium chloride flush  3 mL Intravenous Q12H  . Vitamin D (Ergocalciferol)  50,000 Units Oral Q Wed    Continuous Infusions:    PRN Meds: sodium chloride, acetaminophen, LORazepam, ondansetron (ZOFRAN) IV, sodium chloride flush, traMADol  Physical Exam  Constitutional: She appears well-developed and well-nourished.  HENT:  Head: Normocephalic and atraumatic.  Cardiovascular: Normal rate and regular rhythm.   Pulmonary/Chest: Effort normal and breath sounds normal.  Musculoskeletal:  Generalized  weakness  Skin: Skin is warm and dry.  Psychiatric:  Pleasantly confused, confabulating            Vital Signs: BP (!) 113/47 (BP Location: Left Arm)   Pulse 73   Temp 97.1 F (36.2 C) (Axillary)   Resp 18   Ht _0  (1.626 m)   Wt 92.1 kg (203 lb 0.7 oz)   SpO2 99%   BMI 34.85 kg/m  SpO2: SpO2: 99 % O2 Device: O2 Device: Not Delivered O2 Flow Rate:  Intake/output summary:   Intake/Output Summary (Last 24 hours) at 10/30/15 1340 Last data filed at 10/30/15 0809  Gross per 24 hour  Intake              363 ml  Output                5 ml  Net              358 ml   LBM: Last BM Date: 10/29/15 Baseline Weight: Weight: 93.8 kg (206 lb 12.7 oz) Most recent weight: Weight: 92.1 kg (203 lb 0.7 oz)       Palliative Assessment/Data: PPS: 30%   Flowsheet Rows   Flowsheet Row Most Recent Value  Intake Tab  Referral Department  Hospitalist  Unit at Time of Referral  Cardiac/Telemetry Unit  Palliative Care Primary Diagnosis  Cardiac  Date Notified  10/26/15  Palliative Care Type  New Palliative care  Reason for referral  Clarify Goals of Care  Date of Admission  10/26/15  Date first seen by Palliative Care  10/27/15  # of days Palliative referral response time  1 Day(s)  # of days IP prior to Palliative referral  0  Clinical Assessment  Palliative Performance Scale Score  30%  Pain Max last 24 hours  Not able to report  Pain Min Last 24 hours  Not able to report  Dyspnea Max Last 24 Hours  Not able to report  Dyspnea Min Last 24 hours  Not able to report  Nausea Max Last 24 Hours  Not able to report  Nausea Min Last 24 Hours  Not able to report  Anxiety Max Last 24 Hours  Not able to report  Anxiety Min Last 24 Hours  Not able to report  Other Max Last 24 Hours  Not able to report  Psychosocial & Spiritual Assessment  Palliative Care Outcomes  Patient/Family meeting held?  Yes  Who was at the meeting?  husband      Patient Active Problem List   Diagnosis Date  Noted  . Moderate dementia without behavioral disturbance 10/30/2015  . Dementia with behavioral disturbance   . Advance care planning   . Goals of care, counseling/discussion   . Palliative care by specialist   . Dyspnea   . Palliative care encounter   . Edema 10/26/2015  . Acute on chronic systolic CHF (congestive heart failure) ([place]) 10/26/2015  . Chronic confusion 10/26/2015  . Elevated lactic acid level 10/26/2015  . 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 Ischemic cardiomyopathy 09/28/2013  . Insulin dependent diabetes mellitus ([place]) 08/10/2012  . Cardiomyopathy, ischemic 06/30/2012  . Chest pain 06/24/2010  . CAD (coronary artery disease) 06/24/2010  . Hypertension 06/24/2010  . Hyperlipidemia 06/24/2010  . Murmur 06/24/2010  . Bruit 06/24/2010    Palliative Care Assessment & Plan   Patient Profile:  75 y.o. female  with past medical history of Severe cardiac disease going back to 1998 when she had an MI, she also underwent heart catheterization in 2014, EF at that time was 25%, CABG in 2014 with EF 30%, 2015 EF documented at 23% admitted on 10/26/2015 with increased lower extremity swelling, and increased confusion per her husband and first cousin. Per chart review patient has had memory loss and confusion for a while and has been followed by  neurology in the outpatient setting. At one point neurology recommended an MRI of the brain but this was not performed. Reportedly patient refused to have this  done. Also noted that after discussion with neurologist husband agreed to take over medication management for patient but apparently she has not been taking any of her cardiac meds since that time (it is not clear to me how long she has been off of her medications). Patient is no longer taking her insulin and other oral medications. Chest x-ray was negative for any acute cardiopulmonary disease. Her vital signs were stable and she was afebrile saturating well  on room air. Chemistry panels were unremarkable except for glucose was elevated at 269, and BNP was 505. Lactic acid was mildly elevated. Urinalysis was not suggestive of infection but she was given an empiric dose of Rocephin as well as fluid bolus in the emergency room..   Assessment:  Ashlee [name] is much more open to patient's diagnosis of dementia and disease trajectory. He agrees that CPR would not cure patient's dementia and could leave her worse off than she is now.   Recommendations/Plan:  DNR  Continue [place] conversation with outpatient palliative at discharge with hope for eventual transition to hospice.   Durable DNR placed on chart  Goals of Care and Additional Recommendations:  Limitations on Scope of Treatment:   Code Status:     Code Status Orders        Start     Ordered   10/30/15 1316  Do not attempt resuscitation (DNR)  Continuous    Question Answer Comment  In the event of cardiac or respiratory ARREST Do not call a "code blue"   In the event of cardiac or respiratory ARREST Do not perform Intubation, CPR, defibrillation or ACLS   In the event of cardiac or respiratory ARREST Use medication by any route, position, wound care, and other measures to relive pain and suffering. May use oxygen, suction and manual treatment of airway obstruction as needed for comfort.      10/30/15 1315    Code Status History    Date Active Date Inactive Code Status Order ID Comments User Context   10/26/2015  9:37 PM 10/30/2015  1:15 PM Full Code [id number]  Vianne Bulls, MD Inpatient   09/28/2013  5:16 PM 09/29/2013  2:46 PM Full Code [id number]  Deboraha Sprang, MD Inpatient   07/11/2012  2:46 PM 07/13/2012  2:29 PM Full Code [id number]  Coolidge Breeze, PA-C Inpatient          Prognosis:   < 6 months aeb pat recent significant decline in level of functioning, cognition, not eating and refusing medications at times d/t advanced dementia  Discharge Planning:  [place]  with Hospice  Care plan was discussed with spouse, Ashlee [name]; cousin Altha Harm, and Dr. Hartford Poli- patient's attending.  Thank you for allowing the Palliative Medicine Team to assist in the care of this patient.   Time In: 1300 Time Out: 1330 Total Time 30 Prolonged Time Billed no      Greater than 50%  of this time was spent counseling and coordinating care related to the above assessment and plan.  Mariana Kaufman, AGNP-C Palliative Medicine    Please contact Palliative Medicine Team phone at [phone] for questions and concerns.

## 2015-10-31 NOTE — Care Management Note (Addendum)
Case Management Note Donn Pierini RN, BSN Unit 2W-Case Manager [phone]  Patient Details  Name: Ashlee [name] MRN: [id number] Date of Birth: 01/22/1941  Subjective/Objective:    Pt admitted with AMS,  Acute on chronic systolic CHF                Action/Plan: PTA pt lived at home with husband, reportedly has developed memory loss and confusion starting in 2016- unsure of home situation and husbands ability to assist pt with medications and daily care- CSW has been consulted, also has recommendations for STSNF, PC has also been consulted for GOC. CM to follow for further d/c needs and plan.   Expected Discharge Date:  10/30/15               Expected Discharge Plan:  Skilled Nursing Facility  In-House Referral:  Clinical Social Work  Discharge planning Services  CM Consult  Post Acute Care Choice:    Choice offered to:     DME Arranged:    DME Agency:     HH Arranged:    HH Agency:     Status of Service:  Completed, signed off  If discussed at [employer], dates discussed:    Discharge Disposition: skilled facility   Additional Comments:  10/30/15- Donn Pierini RN, CM- pt for d/c today to SNF with [facility] following- CSW following for placement needs.   Darrold Span, RN 10/31/2015, 11:07 AM

## 2015-11-20 ENCOUNTER — Emergency Department (HOSPITAL_BASED_OUTPATIENT_CLINIC_OR_DEPARTMENT_OTHER)
Admit: 2015-11-20 | Discharge: 2015-11-20 | Disposition: A | Discharge status: Home / Self Care | Payer: Medicare Other | Attending: Emergency Medicine | Admitting: Emergency Medicine

## 2015-11-20 ENCOUNTER — Encounter (HOSPITAL_COMMUNITY): Payer: Self-pay | Admitting: Emergency Medicine

## 2015-11-20 ENCOUNTER — Observation Stay (HOSPITAL_COMMUNITY): Payer: Medicare Other

## 2015-11-20 ENCOUNTER — Observation Stay (HOSPITAL_COMMUNITY)
Admission: EM | Admit: 2015-11-20 | Discharge: 2015-11-23 | Disposition: A | Discharge status: Home / Self Care | Payer: Medicare Other | Attending: Internal Medicine | Admitting: Internal Medicine

## 2015-11-20 DIAGNOSIS — Z79899 Other long term (current) drug therapy: Secondary | ICD-10-CM | POA: Insufficient documentation

## 2015-11-20 DIAGNOSIS — F0391 Unspecified dementia with behavioral disturbance: Secondary | ICD-10-CM | POA: Diagnosis present

## 2015-11-20 DIAGNOSIS — F0390 Unspecified dementia without behavioral disturbance: Secondary | ICD-10-CM | POA: Insufficient documentation

## 2015-11-20 DIAGNOSIS — Z23 Encounter for immunization: Secondary | ICD-10-CM | POA: Diagnosis not present

## 2015-11-20 DIAGNOSIS — N179 Acute kidney failure, unspecified: Secondary | ICD-10-CM | POA: Diagnosis not present

## 2015-11-20 DIAGNOSIS — Z9889 Other specified postprocedural states: Secondary | ICD-10-CM | POA: Diagnosis not present

## 2015-11-20 DIAGNOSIS — I130 Hypertensive heart and chronic kidney disease with heart failure and stage 1 through stage 4 chronic kidney disease, or unspecified chronic kidney disease: Secondary | ICD-10-CM | POA: Diagnosis not present

## 2015-11-20 DIAGNOSIS — Z791 Long term (current) use of non-steroidal anti-inflammatories (NSAID): Secondary | ICD-10-CM | POA: Insufficient documentation

## 2015-11-20 DIAGNOSIS — Z885 Allergy status to narcotic agent status: Secondary | ICD-10-CM | POA: Insufficient documentation

## 2015-11-20 DIAGNOSIS — Z951 Presence of aortocoronary bypass graft: Secondary | ICD-10-CM | POA: Diagnosis not present

## 2015-11-20 DIAGNOSIS — D509 Iron deficiency anemia, unspecified: Secondary | ICD-10-CM | POA: Diagnosis not present

## 2015-11-20 DIAGNOSIS — I5022 Chronic systolic (congestive) heart failure: Secondary | ICD-10-CM | POA: Insufficient documentation

## 2015-11-20 DIAGNOSIS — M7989 Other specified soft tissue disorders: Secondary | ICD-10-CM

## 2015-11-20 DIAGNOSIS — Z955 Presence of coronary angioplasty implant and graft: Secondary | ICD-10-CM | POA: Insufficient documentation

## 2015-11-20 DIAGNOSIS — E876 Hypokalemia: Secondary | ICD-10-CM | POA: Insufficient documentation

## 2015-11-20 DIAGNOSIS — Z95810 Presence of automatic (implantable) cardiac defibrillator: Secondary | ICD-10-CM | POA: Diagnosis not present

## 2015-11-20 DIAGNOSIS — I252 Old myocardial infarction: Secondary | ICD-10-CM | POA: Insufficient documentation

## 2015-11-20 DIAGNOSIS — R2689 Other abnormalities of gait and mobility: Secondary | ICD-10-CM | POA: Diagnosis not present

## 2015-11-20 DIAGNOSIS — R4182 Altered mental status, unspecified: Secondary | ICD-10-CM

## 2015-11-20 DIAGNOSIS — Z9851 Tubal ligation status: Secondary | ICD-10-CM | POA: Insufficient documentation

## 2015-11-20 DIAGNOSIS — I255 Ischemic cardiomyopathy: Secondary | ICD-10-CM | POA: Diagnosis present

## 2015-11-20 DIAGNOSIS — Z794 Long term (current) use of insulin: Secondary | ICD-10-CM | POA: Insufficient documentation

## 2015-11-20 DIAGNOSIS — F03B0 Unspecified dementia, moderate, without behavioral disturbance, psychotic disturbance, mood disturbance, and anxiety: Secondary | ICD-10-CM | POA: Diagnosis present

## 2015-11-20 DIAGNOSIS — F03918 Unspecified dementia, unspecified severity, with other behavioral disturbance: Secondary | ICD-10-CM | POA: Diagnosis present

## 2015-11-20 DIAGNOSIS — R52 Pain, unspecified: Secondary | ICD-10-CM | POA: Diagnosis present

## 2015-11-20 DIAGNOSIS — M25569 Pain in unspecified knee: Secondary | ICD-10-CM

## 2015-11-20 DIAGNOSIS — I2510 Atherosclerotic heart disease of native coronary artery without angina pectoris: Secondary | ICD-10-CM | POA: Diagnosis not present

## 2015-11-20 DIAGNOSIS — E1122 Type 2 diabetes mellitus with diabetic chronic kidney disease: Secondary | ICD-10-CM | POA: Insufficient documentation

## 2015-11-20 DIAGNOSIS — E785 Hyperlipidemia, unspecified: Secondary | ICD-10-CM | POA: Insufficient documentation

## 2015-11-20 DIAGNOSIS — M2342 Loose body in knee, left knee: Secondary | ICD-10-CM | POA: Insufficient documentation

## 2015-11-20 DIAGNOSIS — Z66 Do not resuscitate: Secondary | ICD-10-CM | POA: Insufficient documentation

## 2015-11-20 DIAGNOSIS — Z96611 Presence of right artificial shoulder joint: Secondary | ICD-10-CM | POA: Insufficient documentation

## 2015-11-20 DIAGNOSIS — R600 Localized edema: Secondary | ICD-10-CM | POA: Insufficient documentation

## 2015-11-20 DIAGNOSIS — M79604 Pain in right leg: Secondary | ICD-10-CM | POA: Insufficient documentation

## 2015-11-20 DIAGNOSIS — I10 Essential (primary) hypertension: Secondary | ICD-10-CM | POA: Diagnosis present

## 2015-11-20 DIAGNOSIS — M2341 Loose body in knee, right knee: Secondary | ICD-10-CM | POA: Insufficient documentation

## 2015-11-20 DIAGNOSIS — E119 Type 2 diabetes mellitus without complications: Secondary | ICD-10-CM

## 2015-11-20 DIAGNOSIS — IMO0001 Reserved for inherently not codable concepts without codable children: Secondary | ICD-10-CM

## 2015-11-20 DIAGNOSIS — N189 Chronic kidney disease, unspecified: Secondary | ICD-10-CM | POA: Diagnosis not present

## 2015-11-20 LAB — BASIC METABOLIC PANEL
Anion gap: 12 (ref 5–15)
BUN: 28 mg/dL — ABNORMAL HIGH (ref 6–20)
CALCIUM: 9.3 mg/dL (ref 8.9–10.3)
CHLORIDE: 100 mmol/L — AB (ref 101–111)
CO2: 23 mmol/L (ref 22–32)
CREATININE: 1.57 mg/dL — AB (ref 0.44–1.00)
GFR calc non Af Amer: 31 mL/min — ABNORMAL LOW (ref >60)
GFR, EST AFRICAN AMERICAN: 36 mL/min — AB (ref >60)
Glucose, Bld: 290 mg/dL — ABNORMAL HIGH (ref 65–99)
Potassium: 3.9 mmol/L (ref 3.5–5.1)
SODIUM: 135 mmol/L (ref 135–145)

## 2015-11-20 LAB — CBC WITH DIFFERENTIAL/PLATELET
BASOS PCT: 0 %
Basophils Absolute: 0 10*3/uL (ref 0.0–0.1)
EOS ABS: 0 10*3/uL (ref 0.0–0.7)
EOS PCT: 0 %
HCT: 40.4 % (ref 36.0–46.0)
HEMOGLOBIN: 13.4 g/dL (ref 12.0–15.0)
Lymphocytes Relative: 10 %
Lymphs Abs: 1.1 10*3/uL (ref 0.7–4.0)
MCH: 27 pg (ref 26.0–34.0)
MCHC: 33.2 g/dL (ref 30.0–36.0)
MCV: 81.5 fL (ref 78.0–100.0)
Monocytes Absolute: 0.8 10*3/uL (ref 0.1–1.0)
Monocytes Relative: 8 %
NEUTROS PCT: 82 %
Neutro Abs: 9 10*3/uL — ABNORMAL HIGH (ref 1.7–7.7)
PLATELETS: 167 10*3/uL (ref 150–400)
RBC: 4.96 MIL/uL (ref 3.87–5.11)
RDW: 14.9 % (ref 11.5–15.5)
WBC: 11 10*3/uL — AB (ref 4.0–10.5)

## 2015-11-20 LAB — URINALYSIS, ROUTINE W REFLEX MICROSCOPIC
KETONES UR: NEGATIVE mg/dL
Leukocytes, UA: NEGATIVE
Nitrite: NEGATIVE
PH: 6 (ref 5.0–8.0)
Protein, ur: 100 mg/dL — AB
SPECIFIC GRAVITY, URINE: 1.036 — AB (ref 1.005–1.030)

## 2015-11-20 LAB — URINE MICROSCOPIC-ADD ON

## 2015-11-20 LAB — GLUCOSE, CAPILLARY: Glucose-Capillary: 248 mg/dL — ABNORMAL HIGH (ref 65–99)

## 2015-11-20 LAB — CK: Total CK: 329 U/L — ABNORMAL HIGH (ref 38–234)

## 2015-11-20 MED ORDER — PNEUMOCOCCAL VAC POLYVALENT 25 MCG/0.5ML IJ INJ
0.5000 mL | INJECTION | INTRAMUSCULAR | Status: AC
  Administered 2015-11-21: 0.5 mL via INTRAMUSCULAR
  Filled 2015-11-20 (×2): qty 0.5

## 2015-11-20 MED ORDER — ROSUVASTATIN CALCIUM 20 MG PO TABS
40.0000 mg | ORAL_TABLET | Freq: Every day | ORAL | Status: DC
  Administered 2015-11-20 – 2015-11-22 (×3): 40 mg via ORAL
  Filled 2015-11-20 (×3): qty 2

## 2015-11-20 MED ORDER — INSULIN ASPART 100 UNIT/ML ~~LOC~~ SOLN
0.0000 [IU] | Freq: Every day | SUBCUTANEOUS | Status: DC
  Administered 2015-11-20: 2 [IU] via SUBCUTANEOUS

## 2015-11-20 MED ORDER — SODIUM CHLORIDE 0.9 % IV BOLUS (SEPSIS)
500.0000 mL | Freq: Once | INTRAVENOUS | Status: AC
  Administered 2015-11-20: 500 mL via INTRAVENOUS

## 2015-11-20 MED ORDER — INFLUENZA VAC SPLIT QUAD 0.5 ML IM SUSY
0.5000 mL | PREFILLED_SYRINGE | INTRAMUSCULAR | Status: AC
  Administered 2015-11-21: 0.5 mL via INTRAMUSCULAR
  Filled 2015-11-20: qty 0.5

## 2015-11-20 MED ORDER — CARVEDILOL 3.125 MG PO TABS
3.1250 mg | ORAL_TABLET | Freq: Two times a day (BID) | ORAL | Status: DC
  Administered 2015-11-21 – 2015-11-22 (×4): 3.125 mg via ORAL
  Filled 2015-11-20 (×4): qty 1

## 2015-11-20 MED ORDER — SODIUM CHLORIDE 0.9 % IV SOLN
INTRAVENOUS | Status: DC
  Administered 2015-11-20 – 2015-11-21 (×2): via INTRAVENOUS

## 2015-11-20 MED ORDER — DICLOFENAC SODIUM 1 % TD GEL
2.0000 g | Freq: Four times a day (QID) | TRANSDERMAL | Status: DC
  Administered 2015-11-20 – 2015-11-22 (×7): 2 g via TOPICAL
  Filled 2015-11-20: qty 100

## 2015-11-20 MED ORDER — ASPIRIN EC 81 MG PO TBEC
81.0000 mg | DELAYED_RELEASE_TABLET | Freq: Every day | ORAL | Status: DC
Start: 2015-11-20 — End: 2015-11-23
  Administered 2015-11-20 – 2015-11-22 (×3): 81 mg via ORAL
  Filled 2015-11-20 (×3): qty 1

## 2015-11-20 MED ORDER — INSULIN ASPART 100 UNIT/ML ~~LOC~~ SOLN
0.0000 [IU] | Freq: Three times a day (TID) | SUBCUTANEOUS | Status: DC
  Administered 2015-11-21: 1 [IU] via SUBCUTANEOUS
  Administered 2015-11-21 (×2): 2 [IU] via SUBCUTANEOUS

## 2015-11-20 MED ORDER — EXENATIDE 10 MCG/0.04ML ~~LOC~~ SOPN: 10.0000 ug | PEN_INJECTOR | Freq: Two times a day (BID) | SUBCUTANEOUS | Status: DC

## 2015-11-20 MED ORDER — ENOXAPARIN SODIUM 40 MG/0.4ML ~~LOC~~ SOLN
40.0000 mg | SUBCUTANEOUS | Status: DC
  Administered 2015-11-20 – 2015-11-21 (×2): 40 mg via SUBCUTANEOUS
  Filled 2015-11-20 (×2): qty 0.4

## 2015-11-20 MED ORDER — LORAZEPAM 0.5 MG PO TABS
0.5000 mg | ORAL_TABLET | Freq: Three times a day (TID) | ORAL | Status: DC | PRN
  Administered 2015-11-21 (×2): 0.5 mg via ORAL
  Filled 2015-11-20 (×2): qty 1

## 2015-11-20 NOTE — ED Notes (Signed)
2 IV start attempts 

## 2015-11-20 NOTE — ED Provider Notes (Signed)
[facility]-EMERGENCY DEPT Provider Note   CSN: [id number] Arrival date & time: 11/20/15  1209     History   Chief Complaint Chief Complaint  Patient presents with  . Leg Swelling    HPI Ashlee [name] is a 75 y.o. female.  Level V Caveat: Dementia.   Ashlee [name] is a [name] who presents to the emergency department with her husband and cousin who report the patient has not been able to walk for the past 5 days. She's been complaining of some right leg pain and they have noticed some right lower extremity edema. The patient has a history of dementia and is usually demented. They report she's been mentating at her baseline. She was admitted to the hospital earlier this month for some confusion, UTI and peripheral edema. In the hospital they ruled out a DVT as well as a fracture to her hip using x-ray and CT. She did have a urinary tract infection which was treated. She was then discharged to rehabilitation facility at [facility]. They report she began to do better at the rehabilitation facility and was walking there. They report 5 days ago she stopped ambulating. The report 3 days ago they noticed that her right lower extremity had some increased swelling. She was discharged Monday back to home and has home health. They report the patient has sat in a chair since she was discharged. She is not laid in the bed or has ambulated. No recent falls. They deny fevers,  vomiting, diarrhea, coughing, difficulty breathing, wheezing or changes to urination.   The history is provided by the spouse, a relative and medical records. No language interpreter was used.    Past Medical History:  Diagnosis Date  . Arthritis    "in my knees" (07/06/2012)  . CAD (coronary artery disease) 1998   stent post heart attack  . DM type 2 (diabetes mellitus, type 2) (HCC)   . HOH (hard of hearing)   . Hyperlipidemia   . Hypertension   . Iron deficiency anemia   . Memory loss   . Migraines    "ages 65  thru 65; associated w/menstral cycle" (07/06/2012)  . Moderate dementia without behavioral disturbance 10/30/2015  . Myocardial infarction (HCC) 1998  . PONV (postoperative nausea and vomiting)   . Shortness of breath    "occasionally; could happen at any time" (07/06/2012)    Patient Active Problem List   Diagnosis Date Noted  . AKI (acute kidney injury) (HCC) 11/20/2015  . Moderate dementia without behavioral disturbance 10/30/2015  . Dementia with behavioral disturbance   . Advance care planning   . Goals of care, counseling/discussion   . Palliative care by specialist   . Do not resuscitate discussion   . Peripheral edema   . Dyspnea   . Palliative care encounter   . Edema 10/26/2015  . Acute on chronic systolic CHF (congestive heart failure) (HCC) 10/26/2015  . Chronic confusion 10/26/2015  . Elevated lactic acid level 10/26/2015  . 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 Ischemic cardiomyopathy 09/28/2013  . Insulin dependent diabetes mellitus (HCC) 08/10/2012  . Cardiomyopathy, ischemic 06/30/2012  . Chest pain 06/24/2010  . CAD (coronary artery disease) 06/24/2010  . Hypertension 06/24/2010  . Hyperlipidemia 06/24/2010  . Murmur 06/24/2010  . Bruit 06/24/2010    Past Surgical History:  Procedure Laterality Date  . BREAST BIOPSY Right    "thought they saw something real tiny; it was a stitch left by OR when breast  reduced" (07/06/2012)  . CARDIAC CATHETERIZATION  07/06/2012  . CORONARY ANGIOPLASTY WITH STENT PLACEMENT  1998   LAD/notes 07/07/2012  . CORONARY ARTERY BYPASS GRAFT N/A 07/11/2012   Procedure: CORONARY ARTERY BYPASS GRAFTING (CABG);  Surgeon: Loreli SlotSteven C Hendrickson, MD;  [facility];  Service: Open Heart Surgery;  Laterality: N/A;  x4, using left internal mammary and right greater saphenous vein.   . IMPLANTABLE CARDIOVERTER DEFIBRILLATOR IMPLANT N/A 09/28/2013   Procedure: IMPLANTABLE CARDIOVERTER DEFIBRILLATOR IMPLANT;  Surgeon:  Duke SalviaSteven C Klein, MD;  [facility];  Service: Cardiovascular;  Laterality: N/A;  . REDUCTION MAMMAPLASTY    . SHOULDER ARTHROSCOPY Right 07/25/2007   exam under anesthesia; arthroscopic biceps tenotomy; partial labral tear debridement; spur excision/notes 07/25/2007 (07/06/2012)  . SHOULDER HEMI-ARTHROPLASTY Right ?2009   "fell and crushed it" (07/06/2012)  . TONSILLECTOMY  1952  . TUBAL LIGATION      OB History    No data available       Home Medications    Prior to Admission medications   Medication Sig Start Date End Date Taking? Authorizing Provider  aspirin 81 MG tablet Take 81 mg by mouth daily.    Historical Provider, MD  BEPREVE 1.5 % SOLN Place 1 drop into both eyes daily as needed (EYES).  11/01/13   Historical Provider, MD  carvedilol (COREG) 3.125 MG tablet Take 1 tablet (3.125 mg total) by mouth 2 (two) times daily with a meal. 10/30/15   Rodolph Bonganiel V Thompson, MD  diclofenac sodium (VOLTAREN) 1 % GEL Apply 2 g topically 4 (four) times daily. 11/22/13   Carrington Clampichard C Tuchman, DPM  ergocalciferol (VITAMIN D2) 50000 UNITS capsule Take 50,000 Units by mouth every Wednesday.  11/20/10   Historical Provider, MD  exenatide (BYETTA 10 MCG PEN) 10 MCG/0.04ML SOLN Inject 10 mcg into the skin 2 (two) times daily with a meal. bid    Historical Provider, MD  Ferrous Sulfate (IRON) 325 (65 FE) MG TABS Take 1 tablet by mouth daily.    Historical Provider, MD  glimepiride (AMARYL) 4 MG tablet Take 2 mg by mouth daily with breakfast.    Historical Provider, MD  glucose blood (SMARTEST TEST) test strip 1 each by Other route 2 (two) times daily. Use as instructed    Historical Provider, MD  insulin NPH (HUMULIN N,NOVOLIN N) 100 UNIT/ML injection Inject 5 Units into the skin at bedtime.     Historical Provider, MD  LORazepam (ATIVAN) 0.5 MG tablet Take 1 tablet (0.5 mg total) by mouth every 8 (eight) hours as needed for anxiety. 10/30/15   Rodolph Bonganiel V Thompson, MD  losartan (COZAAR) 25 MG tablet Take 1  tablet (25 mg total) by mouth daily. 10/30/15   Rodolph Bonganiel V Thompson, MD  metFORMIN (GLUCOPHAGE) 1000 MG tablet Take 500 mg by mouth 2 (two) times daily with a meal. Per pt Dr. Emeline GeneralAlthemier instructed to decrease dosage to 500 mg twice a day    Historical Provider, MD  Multiple Vitamins-Minerals (CENTRUM SILVER ADULT 50+ PO) Take 1 tablet by mouth daily.    Historical Provider, MD  predniSONE (DELTASONE) 10 MG tablet Take 1-5 tablets (10-50 mg total) by mouth daily with breakfast. Take 5 tablets ( 50mg ) daily x 2 days, then 4 tablets (40mg ) daily x 3 days, then 3 tablets (30mg ) daily x 3 days, then 2 tablets (20mg ) daily x 3 days, then 1 tablet (10mg ) daily x 3 days then stop. 10/30/15   Rodolph Bonganiel V Thompson, MD  rosuvastatin (CRESTOR) 40 MG  tablet Take 1 tablet (40 mg total) by mouth daily. 12/24/14   Lewayne Bunting, MD  spironolactone (ALDACTONE) 25 MG tablet Take 0.5 tablets (12.5 mg total) by mouth daily. 10/30/15   Rodolph Bong, MD  traMADol (ULTRAM) 50 MG tablet Take 0.5 tablets (25 mg total) by mouth every 6 (six) hours as needed for moderate pain. 10/30/15   Rodolph Bong, MD    Family History Family History  Problem Relation Age of Onset  . Prostate cancer Father   . Heart attack Mother     MI at age 16  . Hypertension Mother   . Breast cancer Sister   . Throat cancer Brother   . Lung cancer Brother     Social History Social History  Substance Use Topics  . Smoking status: Never Smoker  . Smokeless tobacco: Never Used  . Alcohol use Yes     Comment: 07/06/2012 "glass of wine 1-2X/year"     Allergies   Demerol and Percodan [oxycodone-aspirin]   Review of Systems Review of Systems  Unable to perform ROS: Dementia  Constitutional: Negative for fever.  Respiratory: Negative for cough.   Cardiovascular: Positive for leg swelling.  Gastrointestinal: Negative for diarrhea and vomiting.  Genitourinary: Negative for decreased urine volume.  Skin: Negative for rash.      Physical Exam Updated Vital Signs BP (!) 111/53 (BP Location: Left Arm)   Pulse 71   Temp 97.3 F (36.3 C) (Oral)   Resp 18   Ht 5\' 4"  (1.626 m)   Wt 92.1 kg   SpO2 95%   BMI 34.84 kg/m   Physical Exam  Constitutional: She appears well-developed and well-nourished. No distress.  Nontoxic appearing.  HENT:  Head: Normocephalic and atraumatic.  Mouth/Throat: Oropharynx is clear and moist.  Eyes: Conjunctivae are normal. Pupils are equal, round, and reactive to light. Right eye exhibits no discharge. Left eye exhibits no discharge.  Neck: Neck supple.  Cardiovascular: Normal rate, regular rhythm, normal heart sounds and intact distal pulses.   Bilateral radial, posterior tibialis and dorsalis pedis pulses are intact.    Pulmonary/Chest: Effort normal and breath sounds normal. No respiratory distress. She has no wheezes. She has no rales.  Lungs clear auscultation bilaterally. Symmetric chest expansion bilaterally. No increased work of breathing.  Abdominal: Soft. There is no tenderness.  Musculoskeletal: She exhibits edema. She exhibits no tenderness or deformity.  No lower extremity tenderness. Mild edema noted to her right lower extremity. No pitting edema. No lower extremity warmth or erythema. Patient does not cooperate with exam for range of motion. She does not complain of pain with palpation to her right hip. No right hip deformity or overlying skin changes.  Lymphadenopathy:    She has no cervical adenopathy.  Neurological: She is alert. Coordination normal.  Patient is alert but demented.  Skin: Skin is warm and dry. Capillary refill takes less than 2 seconds. No rash noted. She is not diaphoretic. No erythema. No pallor.  Psychiatric:  Pleasantly demented.  Nursing note and vitals reviewed.    ED Treatments / Results  Labs (all labs ordered are listed, but only abnormal results are displayed) Labs Reviewed  CBC WITH DIFFERENTIAL/PLATELET - Abnormal; Notable  for the following:       Result Value   WBC 11.0 (*)    Neutro Abs 9.0 (*)    All other components within normal limits  BASIC METABOLIC PANEL - Abnormal; Notable for the following:    Chloride 100 (*)  Glucose, Bld 290 (*)    BUN 28 (*)    Creatinine, Ser 1.57 (*)    GFR calc non Af Amer 31 (*)    GFR calc Af Amer 36 (*)    All other components within normal limits  URINALYSIS, ROUTINE W REFLEX MICROSCOPIC (NOT AT [facility]) - Abnormal; Notable for the following:    Color, Urine AMBER (*)    Specific Gravity, Urine 1.036 (*)    Glucose, UA >1000 (*)    Hgb urine dipstick SMALL (*)    Bilirubin Urine SMALL (*)    Protein, ur 100 (*)    All other components within normal limits  URINE MICROSCOPIC-ADD ON - Abnormal; Notable for the following:    Squamous Epithelial / LPF 0-5 (*)    Bacteria, UA FEW (*)    All other components within normal limits    EKG  EKG Interpretation None       Radiology No results found.  Procedures Procedures (including critical care time)  Medications Ordered in ED Medications  sodium chloride 0.9 % bolus 500 mL (not administered)     Initial Impression / Assessment and Plan / ED Course  I have reviewed the triage vital signs and the nursing notes.  Pertinent labs & imaging results that were available during my care of the patient were reviewed by me and considered in my medical decision making (see chart for details).  Clinical Course   This is a [name] who presents to the emergency department with her husband and cousin who report the patient has not been able to walk for the past 5 days. She's been complaining of some right leg pain and they have noticed some right lower extremity edema. The patient has a history of dementia and is usually demented. They report she's been mentating at her baseline. She was admitted to the hospital earlier this month for some confusion, UTI and peripheral edema. In the hospital they ruled out a DVT  as well as a fracture to her hip using x-ray and CT. She did have a urinary tract infection which was treated. She was then discharged to rehabilitation facility at [facility]. They report she began to do better at the rehabilitation facility and was walking there. They report 5 days ago she stopped ambulating. The report 3 days ago they noticed that her right lower extremity had some increased swelling. She was discharged Monday back to home and has home health. They report the patient has sat in a chair since she was discharged. She is not laid in the bed or has ambulated. No recent falls. No fever.  On exam the patient is afebrile nontoxic appearing. She is pleasantly demented. She does have very mild right lower extremity edema. No pitting edema. She does not cooperate with exam for range of motion of her hip. No deformity or overlying skin changes to her hip. No lower extremity warmth or erythema. DVT study is negative. Urinalysis is nitrite and leukocyte negative. Specific gravity is 1.036. BMP is normal for a creatinine of 1.57 with a GFR of 36. Patient and is around 0.88. Patient with acute kidney injury. This is likely from the patient not eating and drinking well after discharge. We'll admit for AKI. Patient will likely need placement for rehabilitation for continued care. I consulted with hospitalist Dr. Blake Divine who accepted the patient for admission and requested temp orders for med surg bed. Family is in agreement with admission.   This patient was discussed  with Dr. Jeraldine Loots who agrees with assessment and plan.   Final Clinical Impressions(s) / ED Diagnoses   Final diagnoses:  AKI (acute kidney injury) (HCC)  Right leg swelling    New Prescriptions New Prescriptions   No medications on file     Everlene Farrier, PA-C 11/20/15 1735    Gerhard Munch, MD 11/20/15 1759

## 2015-11-20 NOTE — Progress Notes (Signed)
*  Preliminary Results* Right lower extremity venous duplex completed. Right lower extremity is negative for deep vein thrombosis. There is no evidence of right Baker's cyst.  11/20/2015 3:02 PM  Gertie Fey, BS, RVT, RDCS, RDMS

## 2015-11-20 NOTE — H&P (Signed)
History and Physical    Ashlee [name] ZOX:[id number] DOB: 1940-08-09 DOA: 11/20/2015  PCP: Junious Silk, MD  Patient coming from: [facility]  Chief Complaint: not able to ambulate since two days.   HPI: Ashlee [name] is a 75 y.o. female with medical history significant of hypertension, anemia, dementia, diabetes mellitus, presents with not able to ambulate since two days. Her husband at bedside, reports she was recently discharged from [facility] and since then she was not able to ambulate. She also reports right lower extremity swelling and some tenderness. She and her husband denies any fever or chills. No other complaints. She was in her wheelchair for two days at home. On arrival to ED, she was found to be in acute renal failure. She was referred to medical service for admission.   Review of Systems: pt has baseline dementia, complete ROS could not be done.   Past Medical History:  Diagnosis Date  . Arthritis    "in my knees" (07/06/2012)  . CAD (coronary artery disease) 1998   stent post heart attack  . DM type 2 (diabetes mellitus, type 2) (HCC)   . HOH (hard of hearing)   . Hyperlipidemia   . Hypertension   . Iron deficiency anemia   . Memory loss   . Migraines    "ages 22 thru 54; associated w/menstral cycle" (07/06/2012)  . Moderate dementia without behavioral disturbance 10/30/2015  . Myocardial infarction (HCC) 1998  . PONV (postoperative nausea and vomiting)   . Shortness of breath    "occasionally; could happen at any time" (07/06/2012)    Past Surgical History:  Procedure Laterality Date  . BREAST BIOPSY Right    "thought they saw something real tiny; it was a stitch left by OR when breast reduced" (07/06/2012)  . CARDIAC CATHETERIZATION  07/06/2012  . CORONARY ANGIOPLASTY WITH STENT PLACEMENT  1998   LAD/notes 07/07/2012  . CORONARY ARTERY BYPASS GRAFT N/A 07/11/2012   Procedure: CORONARY ARTERY BYPASS GRAFTING (CABG);  Surgeon: Loreli Slot, MD;   Location: [facility] OR;  Service: Open Heart Surgery;  Laterality: N/A;  x4, using left internal mammary and right greater saphenous vein.   . IMPLANTABLE CARDIOVERTER DEFIBRILLATOR IMPLANT N/A 09/28/2013   Procedure: IMPLANTABLE CARDIOVERTER DEFIBRILLATOR IMPLANT;  Surgeon: Duke Salvia, MD;  Location[facility] C[facility] LAB;  Service: Cardiovascular;  Laterality: N/A;  . REDUCTION MAMMAPLASTY    . SHOULDER ARTHROSCOPY Right 07/25/2007   exam under anesthesia; arthroscopic biceps tenotomy; partial labral tear debridement; spur excision/notes 07/25/2007 (07/06/2012)  . SHOULDER HEMI-ARTHROPLASTY Right ?2009   "fell and crushed it" (07/06/2012)  . TONSILLECTOMY  1952  . TUBAL LIGATION       reports that she has never smoked. She has never used smokeless tobacco. She reports that she drinks alcohol. She reports that she does not use drugs.  Allergies  Allergen Reactions  . Demerol Nausea And Vomiting    Perfuse vomitting  . Percodan [Oxycodone-Aspirin] Nausea And Vomiting    Perfuse vomitting    Family History  Problem Relation Age of Onset  . Prostate cancer Father   . Heart attack Mother     MI at age 67  . Hypertension Mother   . Breast cancer Sister   . Throat cancer Brother   . Lung cancer Brother   Family history reviewed and not pertinent   Prior to Admission medications   Medication Sig Start Date End Date Taking? Authorizing Provider  aspirin 81 MG tablet Take 81 mg by  mouth daily.    Historical Provider, MD  BEPREVE 1.5 % SOLN Place 1 drop into both eyes daily as needed (EYES).  11/01/13   Historical Provider, MD  carvedilol (COREG) 3.125 MG tablet Take 1 tablet (3.125 mg total) by mouth 2 (two) times daily with a meal. 10/30/15   Rodolph Bonganiel V Thompson, MD  diclofenac sodium (VOLTAREN) 1 % GEL Apply 2 g topically 4 (four) times daily. 11/22/13   Carrington Clampichard C Tuchman, DPM  ergocalciferol (VITAMIN D2) 50000 UNITS capsule Take 50,000 Units by mouth every Wednesday.  11/20/10   Historical Provider, MD    exenatide (BYETTA 10 MCG PEN) 10 MCG/0.04ML SOLN Inject 10 mcg into the skin 2 (two) times daily with a meal. bid    Historical Provider, MD  Ferrous Sulfate (IRON) 325 (65 FE) MG TABS Take 1 tablet by mouth daily.    Historical Provider, MD  glimepiride (AMARYL) 4 MG tablet Take 2 mg by mouth daily with breakfast.    Historical Provider, MD  glucose blood (SMARTEST TEST) test strip 1 each by Other route 2 (two) times daily. Use as instructed    Historical Provider, MD  insulin NPH (HUMULIN N,NOVOLIN N) 100 UNIT/ML injection Inject 5 Units into the skin at bedtime.     Historical Provider, MD  LORazepam (ATIVAN) 0.5 MG tablet Take 1 tablet (0.5 mg total) by mouth every 8 (eight) hours as needed for anxiety. 10/30/15   Rodolph Bonganiel V Thompson, MD  losartan (COZAAR) 25 MG tablet Take 1 tablet (25 mg total) by mouth daily. 10/30/15   Rodolph Bonganiel V Thompson, MD  metFORMIN (GLUCOPHAGE) 1000 MG tablet Take 500 mg by mouth 2 (two) times daily with a meal. Per pt Dr. Emeline GeneralAlthemier instructed to decrease dosage to 500 mg twice a day    Historical Provider, MD  Multiple Vitamins-Minerals (CENTRUM SILVER ADULT 50+ PO) Take 1 tablet by mouth daily.    Historical Provider, MD  predniSONE (DELTASONE) 10 MG tablet Take 1-5 tablets (10-50 mg total) by mouth daily with breakfast. Take 5 tablets ( 50mg ) daily x 2 days, then 4 tablets (40mg ) daily x 3 days, then 3 tablets (30mg ) daily x 3 days, then 2 tablets (20mg ) daily x 3 days, then 1 tablet (10mg ) daily x 3 days then stop. 10/30/15   Rodolph Bonganiel V Thompson, MD  rosuvastatin (CRESTOR) 40 MG tablet Take 1 tablet (40 mg total) by mouth daily. 12/24/14   Lewayne BuntingBrian S Crenshaw, MD  spironolactone (ALDACTONE) 25 MG tablet Take 0.5 tablets (12.5 mg total) by mouth daily. 10/30/15   Rodolph Bonganiel V Thompson, MD  traMADol (ULTRAM) 50 MG tablet Take 0.5 tablets (25 mg total) by mouth every 6 (six) hours as needed for moderate pain. 10/30/15   Rodolph Bonganiel V Thompson, MD    Physical Exam: Vitals:   11/20/15 1224  11/20/15 1432 11/20/15 1600 11/20/15 1702  BP: 139/67 133/70 (!) 111/53 (!) 111/53  Pulse: 84 88 77 71  Resp: 18 18 16 18   Temp: 97.3 F (36.3 C) 97.3 F (36.3 C)    TempSrc: Oral Oral    SpO2: 97% 98% 99% 95%  Weight:      Height:          Constitutional: NAD, calm, comfortable Vitals:   11/20/15 1224 11/20/15 1432 11/20/15 1600 11/20/15 1702  BP: 139/67 133/70 (!) 111/53 (!) 111/53  Pulse: 84 88 77 71  Resp: 18 18 16 18   Temp: 97.3 F (36.3 C) 97.3 F (36.3 C)    TempSrc: Oral  Oral    SpO2: 97% 98% 99% 95%  Weight:      Height:       Eyes: PERRL, lids and conjunctivae normal ENMT: Mucous membranes dry. . Posterior pharynx clear of any exudate or lesions.Normal dentition.  Respiratory: clear to auscultation bilaterally, no wheezing, no crackles. Normal respiratory effort. No accessory muscle use.  Cardiovascular: Regular rate and rhythm, no murmurs / rubs / gallops. No extremity edema. 2+ pedal pulses. No carotid bruits.  Abdomen: no tenderness, no masses palpated. No hepatosplenomegaly. Bowel sounds positive.  Musculoskeletal: right lower extremity sswelling, warm and slightly tender .  Skin: no rashes, lesions, ulcers. No induration Neurologic: confused.      Labs on Admission: I have personally reviewed following labs and imaging studies  CBC:  Recent Labs Lab 11/20/15 1444  WBC 11.0*  NEUTROABS 9.0*  HGB 13.4  HCT 40.4  MCV 81.5  PLT 167   Basic Metabolic Panel:  Recent Labs Lab 11/20/15 1444  NA 135  K 3.9  CL 100*  CO2 23  GLUCOSE 290*  BUN 28*  CREATININE 1.57*  CALCIUM 9.3   GFR: Estimated Creatinine Clearance: 33 mL/min (by C-G formula based on SCr of 1.57 mg/dL (H)). Liver Function Tests: No results for input(s): AST, ALT, ALKPHOS, BILITOT, PROT, ALBUMIN in the last 168 hours. No results for input(s): LIPASE, AMYLASE in the last 168 hours. No results for input(s): AMMONIA in the last 168 hours. Coagulation Profile: No results for  input(s): INR, PROTIME in the last 168 hours. Cardiac Enzymes: No results for input(s): CKTOTAL, CKMB, CKMBINDEX, TROPONINI in the last 168 hours. BNP (last 3 results) No results for input(s): PROBNP in the last 8760 hours. HbA1C: No results for input(s): HGBA1C in the last 72 hours. CBG: No results for input(s): GLUCAP in the last 168 hours. Lipid Profile: No results for input(s): CHOL, HDL, LDLCALC, TRIG, CHOLHDL, LDLDIRECT in the last 72 hours. Thyroid Function Tests: No results for input(s): TSH, T4TOTAL, FREET4, T3FREE, THYROIDAB in the last 72 hours. Anemia Panel: No results for input(s): VITAMINB12, FOLATE, FERRITIN, TIBC, IRON, RETICCTPCT in the last 72 hours. Urine analysis:    Component Value Date/Time   COLORURINE AMBER (A) 11/20/2015 1349   APPEARANCEUR CLEAR 11/20/2015 1349   LABSPEC 1.036 (H) 11/20/2015 1349   PHURINE 6.0 11/20/2015 1349   GLUCOSEU >1000 (A) 11/20/2015 1349   HGBUR SMALL (A) 11/20/2015 1349   BILIRUBINUR SMALL (A) 11/20/2015 1349   KETONESUR NEGATIVE 11/20/2015 1349   PROTEINUR 100 (A) 11/20/2015 1349   UROBILINOGEN 1.0 07/09/2012 2227   NITRITE NEGATIVE 11/20/2015 1349   LEUKOCYTESUR NEGATIVE 11/20/2015 1349   Sepsis Labs: !!!!!!!!!!!!!!!!!!!!!!!!!!!!!!!!!!!!!!!!!!!! @LABRCNTIP (procalcitonin:4,lacticidven:4) )No results found for this or any previous visit (from the past 240 hour(s)).   Radiological Exams on Admission: No results found.  EKG: Independently reviewed.  Not done.   Assessment/Plan Active Problems:   AKI (acute kidney injury) (HCC)   Acute renal failure (ARF) (HCC)  Acute Kidney injury: Probably secondary to dehydration from decreased po intake. Get CK levels.  - admit for IV fluids, gentle hydration overnight and repeat renal parametrs in am.  - UA is negative for infection.     Right lower extremity swelling: Duplex ruled out DVT.  GET X RAY of the right lower extremity and hip x rays.    Diabetes mellitus; CBG  (last 3)  No results for input(s): GLUCAP in the last 72 hours.  Start SSI. Hold long acting for tonight.   Hypertension: well  controlled.   DVT prophylaxis: lovenox. Code Status: DNR Family Communication: FAMILY at bedside.  Disposition Plan: pending rsolutio of AKI Consults called: NONE Admission status: OBS    Graziella Connery MD [facility] Pager [phone]  If 7PM-7AM, please contact night-coverage [email or web address] Password [facility]  11/20/2015, 5:53 PM

## 2015-11-20 NOTE — ED Triage Notes (Signed)
Pt is from home.  She was recently DC from SNF for mobility rehab.  Son is on way to hospital.  Pt was able to stand on R leg and bear weight on Monday.  Pt arrives with some edema in RLE that is not pitting.  Some Dementia Hx.  Per family to EMS, Pt is at her neuro baseline at this time.  She is alert to self and situation.

## 2015-11-20 NOTE — Progress Notes (Signed)
CSW spoke with patient and patients family regarding current discharge plans. Patient was recently discharged from [facility] and received short-term rehab at [facility]. Patient was discharged from [facility] 2 days ago and they are requesting short-term rehab/long-term placement. CSW provided list of assisted living facilities in the area and discussed the differences between memory care/ALF. Patients family are looking at long-term placement and understands that the ED does not place patients.   Stacy Gardner, LCSWA Clinical Social Worker [phone]

## 2015-11-20 NOTE — ED Notes (Signed)
MD at bedside. 

## 2015-11-20 NOTE — ED Notes (Signed)
Patient transported to X-ray 

## 2015-11-21 DIAGNOSIS — N179 Acute kidney failure, unspecified: Secondary | ICD-10-CM | POA: Diagnosis not present

## 2015-11-21 LAB — BASIC METABOLIC PANEL
Anion gap: 10 (ref 5–15)
BUN: 23 mg/dL — AB (ref 6–20)
CHLORIDE: 104 mmol/L (ref 101–111)
CO2: 25 mmol/L (ref 22–32)
CREATININE: 1.22 mg/dL — AB (ref 0.44–1.00)
Calcium: 8.8 mg/dL — ABNORMAL LOW (ref 8.9–10.3)
GFR calc Af Amer: 48 mL/min — ABNORMAL LOW (ref >60)
GFR calc non Af Amer: 42 mL/min — ABNORMAL LOW (ref >60)
Glucose, Bld: 192 mg/dL — ABNORMAL HIGH (ref 65–99)
POTASSIUM: 3.2 mmol/L — AB (ref 3.5–5.1)
SODIUM: 139 mmol/L (ref 135–145)

## 2015-11-21 LAB — GLUCOSE, CAPILLARY
GLUCOSE-CAPILLARY: 199 mg/dL — AB (ref 65–99)
GLUCOSE-CAPILLARY: 200 mg/dL — AB (ref 65–99)
GLUCOSE-CAPILLARY: 133 mg/dL — AB (ref 65–99)
Glucose-Capillary: 140 mg/dL — ABNORMAL HIGH (ref 65–99)

## 2015-11-21 MED ORDER — POTASSIUM CHLORIDE CRYS ER 20 MEQ PO TBCR
40.0000 meq | EXTENDED_RELEASE_TABLET | Freq: Two times a day (BID) | ORAL | Status: AC
  Administered 2015-11-21 (×2): 40 meq via ORAL
  Filled 2015-11-21 (×2): qty 2

## 2015-11-21 MED ORDER — INSULIN ASPART 100 UNIT/ML ~~LOC~~ SOLN
0.0000 [IU] | Freq: Three times a day (TID) | SUBCUTANEOUS | Status: DC
  Administered 2015-11-22 (×3): 3 [IU] via SUBCUTANEOUS

## 2015-11-21 NOTE — Progress Notes (Addendum)
LCSW aware of consult for potiental placement, reviewed notes from ED and spoke with CM.  LCSW reviewed past chart and saw patient recently discharged from [facility].  LCSW is exploring reasons for DC and call placed to [facility] to understand if pt dc due to family wishes, insurance, or inability to participate.  LCSW received call from SNF regarding admission to [facility]. Patient was discharged on 11/18/15 at baseline, walking indpedendtly all over the building per PT notes. Insurance had also quit paying as patient had met her goals. If patient returns to SNF (any SNF) patient and husband would have to pay privately as insurance has been maxed out. Patient is able to return to SNF under private pay only.   LCSW explore options with husband in effort to understand his needs and care of patient in preparing for disposition/dc from hospital. Patient was also given a long term care facility list and explained options in the ED by SW.  Will follow up.  Full assessment to come. Will follow up.  Ashlee [name], MSW Clinical Social Work: Printmaker Coverage for :  [employer]  [phone]

## 2015-11-21 NOTE — Progress Notes (Signed)
Inpatient Diabetes Program Recommendations  AACE/ADA: New Consensus Statement on Inpatient Glycemic Control (2015)  Target Ranges:  Prepandial:   less than 140 mg/dL      Peak postprandial:   less than 180 mg/dL (1-2 hours)      Critically ill patients:  140 - 180 mg/dL   Lab Results  Component Value Date   GLUCAP 200 (H) 11/21/2015   HGBA1C 9.9 (H) 10/27/2015    Review of Glycemic Control  Diabetes history: DM 2 Outpatient Diabetes medications: Byetta 10 mcg BID, Amaryl 2 mg Daily, Metformin 500 mg BID, NPH 5 units QHS Current orders for Inpatient glycemic control: Byetta 10 mcg BID, Novolog Sensitive + HS scale  Inpatient Diabetes Program Recommendations:   Glucose 199 and 200 today, Please increase correction scale to Novolog Moderate TID.  Note patient's Byetta ordered from home. Byetta is not on our formulary, have patient bring from home or d/c while inpatient.  Thanks,  Christena Deem RN, MSN, [facility] Inpatient Diabetes Coordinator Team Pager [phone] (8a-5p)

## 2015-11-21 NOTE — Progress Notes (Signed)
Pt is lethargic at present time. Will continue to follow.

## 2015-11-21 NOTE — Progress Notes (Signed)
CSW contacted patients spouse, [name], spouse stated he will be coming to [facility] shortly and would like to discuss discharge plans then. CSW will come to room to speak with spouse.   Stacy Gardner, LCSWA Clinical Social Worker [phone]

## 2015-11-21 NOTE — Clinical Social Work Note (Signed)
Clinical Social Work Assessment  Patient Details  Name: Ashlee [name] MRN: [id number] Date of Birth: 05-Jun-1940  Date of referral:  11/21/15               Reason for consult:  Facility Placement, Discharge Planning                Permission sought to share information with:  Oceanographer granted to share information::  Yes, Verbal Permission Granted  Name::        Agency::     Relationship::     Contact Information:     Housing/Transportation Living arrangements for the past 2 months:  Single Family Home, Skilled Nursing Facility Source of Information:  Spouse [name]) Patient Interpreter Needed:  None Criminal Activity/Legal Involvement Pertinent to Current Situation/Hospitalization:    Significant Relationships:  Spouse, Other Family Members Scientist, clinical (histocompatibility and immunogenetics)) Lives with:  Spouse Do you feel safe going back to the place where you live?  No Need for family participation in patient care:  Yes (Comment)  Care giving concerns:  CSW received consult to speak with patients family regarding discharge plans.    Social Worker assessment / plan:  CSW spoke with patients spouse, [name], regarding discharge plans. Patient was recently discharged from [facility] and insurance will not pay for anymore days in short-term rehab. Patient will have to private pay for short-term rehab if available. CSW discussed these options with patients spouse and he does not believe they are able to afford to private pay. Patients spouse had questions regarding Medicaid/Medicare and why the patient was unable to be covered under their current plan. Patients spouse requested list of skilled nursing facilities in the area and the cost for facilities. EDSW provided patient with list of assisted living facilities for long-term placement.   Employment status:  Retired Database administrator PT Recommendations:  Not assessed at this time Information /  Referral to community resources:     Patient/Family's Response to care:  Patients family appreciated CSW.   Patient/Family's Understanding of and Emotional Response to Diagnosis, Current Treatment, and Prognosis:  Patients spouse had questions regarding Medicaid/Medicare and the difference between them. Patients spouse asked if they could potentially apply for Medicaid but did not think they would be approved. Patient spouse understands current treatment.   Emotional Assessment Appearance:  Appears stated age Attitude/Demeanor/Rapport:  Unable to Assess Affect (typically observed):  Unable to Assess Orientation:    Alcohol / Substance use:    Psych involvement (Current and /or in the community):  No (Comment)  Discharge Needs  Concerns to be addressed:  Discharge Planning Concerns Readmission within the last 30 days:  Yes Current discharge risk:  None Barriers to Discharge:  Insurance Authorization   Donnie Coffin, LCSW 11/21/2015, 8:05 PM

## 2015-11-21 NOTE — Care Management Obs Status (Signed)
MEDICARE OBSERVATION STATUS NOTIFICATION   Patient Details  Name: Ashlee [name] MRN: [id number] Date of Birth: [birth year]   Medicare Observation Status Notification Given:  Yes, pt is lethargic left copy in room.    Geni Bers, RN 11/21/2015, 1:51 PM

## 2015-11-21 NOTE — Progress Notes (Signed)
PROGRESS NOTE    Ashlee [name]  TDH:[id number] DOB: [birth year] DOA: 11/20/2015 PCP: Junious Silk, MD    Brief Narrative:  Ashlee [name] is a 75 y.o. female with medical history significant of hypertension, anemia, dementia, diabetes mellitus, presents with not able to ambulate since two days prior to admission on arrival to ED. She was found to be in acute on chronic renal failure.   Assessment & Plan:   Active Problems:   AKI (acute kidney injury) (HCC)   Acute renal failure (ARF) (HCC)   ACUTE RENAL FAILURE: - probably second ary to dehydration from decreased po intake, renal parameters improved with hydration.  - STOP IV fluds as she has a h/o of chronic systolic CHF.  - hold nephrotoxic agents.  - hold spironolactone.  - MONITOR RENAL Parameters.    Chronic systolic heart failure: Appears compensated.    Diabetes mellitus: CBG (last 3)   Recent Labs  11/21/15 0739 11/21/15 1156 11/21/15 1644  GLUCAP 199* 200* 133*    Change to moderate scale SSI. Resume [place].   Deconditioning, unable to ambulate: Physical therapy consulted.    Altered mental status: Husband reports that she is at baseline. She has dementia.   Hypertension: controlled.   CAD: S/p CABG in 2014.  Currently denies any chest pain.    rigth lower extremity swelling: duplex ruled out DVT.  X rays of the right lower extremity and hip  X rays are negative.   Hypokalemia: replete as needed.   DVT prophylaxis: (Lovenox/) Code Status: DNR.  Family Communication: none at bedside.  Disposition Plan: pending further eval.    Consultants:  None.   Procedures: none.    Antimicrobials: none.    Subjective: No complaints.   Objective: Vitals:   11/20/15 1850 11/20/15 1934 11/21/15 0650 11/21/15 1400  BP: 121/81 (!) 148/69 (!) 152/68 (!) 138/56  Pulse: 86 84 89 84  Resp: 18 18 18 20   Temp:  99 F (37.2 C) 97.7 F (36.5 C) 98.4 F (36.9 C)  TempSrc:  Oral Oral Oral    SpO2: 97% 100% 100% 100%  Weight:  92.1 kg (203 lb)    Height:  5\' 4"  (1.626 m)      Intake/Output Summary (Last 24 hours) at 11/21/15 1814 Last data filed at 11/21/15 1737  Gross per 24 hour  Intake          1636.25 ml  Output                0 ml  Net          1636.25 ml   Filed Weights   11/20/15 1217 11/20/15 1934  Weight: 92.1 kg (203 lb) 92.1 kg (203 lb)    Examination:  General exam: Appears calm and comfortable  Respiratory system: Clear to auscultation. Respiratory effort normal. Cardiovascular system: S1 & S2 heard, RRR. No JVD, murmurs, rubs, gallops or clicks. No pedal edema. Gastrointestinal system: Abdomen is nondistended, soft and nontender. No organomegaly or masses felt. Normal bowel sounds heard. Central nervous system: Alert and confused.  Skin: No rashes, lesions or ulcers     Data Reviewed: I have personally reviewed following labs and imaging studies  CBC:  Recent Labs Lab 11/20/15 1444  WBC 11.0*  NEUTROABS 9.0*  HGB 13.4  HCT 40.4  MCV 81.5  PLT 167   Basic Metabolic Panel:  Recent Labs Lab 11/20/15 1444 11/21/15 0504  NA 135 139  K 3.9 3.2*  CL 100* 104  CO2 23 25  GLUCOSE 290* 192*  BUN 28* 23*  CREATININE 1.57* 1.22*  CALCIUM 9.3 8.8*   GFR: Estimated Creatinine Clearance: 42.5 mL/min (by C-G formula based on SCr of 1.22 mg/dL (H)). Liver Function Tests: No results for input(s): AST, ALT, ALKPHOS, BILITOT, PROT, ALBUMIN in the last 168 hours. No results for input(s): LIPASE, AMYLASE in the last 168 hours. No results for input(s): AMMONIA in the last 168 hours. Coagulation Profile: No results for input(s): INR, PROTIME in the last 168 hours. Cardiac Enzymes:  Recent Labs Lab 11/20/15 1444  CKTOTAL 329*   BNP (last 3 results) No results for input(s): PROBNP in the last 8760 hours. HbA1C: No results for input(s): HGBA1C in the last 72 hours. CBG:  Recent Labs Lab 11/20/15 2216 11/21/15 0739 11/21/15 1156  11/21/15 1644  GLUCAP 248* 199* 200* 133*   Lipid Profile: No results for input(s): CHOL, HDL, LDLCALC, TRIG, CHOLHDL, LDLDIRECT in the last 72 hours. Thyroid Function Tests: No results for input(s): TSH, T4TOTAL, FREET4, T3FREE, THYROIDAB in the last 72 hours. Anemia Panel: No results for input(s): VITAMINB12, FOLATE, FERRITIN, TIBC, IRON, RETICCTPCT in the last 72 hours. Sepsis Labs: No results for input(s): PROCALCITON, LATICACIDVEN in the last 168 hours.  No results found for this or any previous visit (from the past 240 hour(s)).       Radiology Studies: Dg Tibia/fibula Right  Result Date: 11/20/2015 CLINICAL DATA:  Female who presents to the emergency department with her husband and cousin who report the patient has not been able to walk for the past 5 days. She's been complaining of some right leg pain and they have noticed some right lower extremity edema. The patient has a history of dementia and is usually demented. They report she's been mentating at her baseline. She was admitted to the hospital earlier this month for some confusion, UTI and peripheral edema. In the hospital they ruled out a DVT as well as a fracture to her hip using x-ray and CT. EXAM: RIGHT TIBIA AND FIBULA - 2 VIEW COMPARISON:  None. FINDINGS: No fracture.  No bone lesion. Knee joint is normally aligned. There is marginal spurring consistent with osteoarthritis. Ankle joint is normally spaced and aligned. There is mild subcutaneous soft tissue edema diffusely. Surgical vascular clips are noted along the medial aspect of the knee. IMPRESSION: No fracture or bone lesion. No dislocation. Knee joint osteoarthritis. Electronically Signed   By: Amie Portland M.D.   On: 11/20/2015 19:27   Dg Hips Bilat With Pelvis 2v  Result Date: 11/20/2015 CLINICAL DATA:  Female who presents to the emergency department with her husband and cousin who report the patient has not been able to walk for the past 5 days. She's been  complaining of some right leg pain and they have noticed some right lower extremity edema. The patient has a history of dementia and is usually demented. They report she's been mentating at her baseline. She was admitted to the hospital earlier this month for some confusion, UTI and peripheral edema. In the hospital they ruled out a DVT as well as a fracture to her hip using x-ray and CT. EXAM: DG HIP (WITH OR WITHOUT PELVIS) 2V BILAT COMPARISON:  None. FINDINGS: No fracture.  No bone lesion. Hip joints, SI joints and symphysis pubis are normally spaced and aligned. Soft tissues are unremarkable. IMPRESSION: Negative. Electronically Signed   By: Amie Portland M.D.   On: 11/20/2015 19:26  Scheduled Meds: . aspirin EC  81 mg Oral Daily  . carvedilol  3.125 mg Oral BID WC  . diclofenac sodium  2 g Topical QID  . enoxaparin (LOVENOX) injection  40 mg Subcutaneous Q24H  . exenatide  10 mcg Subcutaneous BID WC  . insulin aspart  0-5 Units Subcutaneous QHS  . insulin aspart  0-9 Units Subcutaneous TID WC  . potassium chloride  40 mEq Oral BID  . rosuvastatin  40 mg Oral q1800   Continuous Infusions: . sodium chloride 75 mL/hr at 11/21/15 1027     LOS: 0 days    Time spent: 25 minutes.    Kathlen ModyAKULA,Halston Fairclough, MD [facility] Pager [phone]  If 7PM-7AM, please contact night-coverage [email or web address] Password [email or web address] 11/21/2015, 6:14 PM

## 2015-11-22 ENCOUNTER — Observation Stay (HOSPITAL_COMMUNITY): Payer: Medicare Other

## 2015-11-22 DIAGNOSIS — N179 Acute kidney failure, unspecified: Secondary | ICD-10-CM | POA: Diagnosis not present

## 2015-11-22 DIAGNOSIS — F0391 Unspecified dementia with behavioral disturbance: Secondary | ICD-10-CM | POA: Diagnosis not present

## 2015-11-22 LAB — BASIC METABOLIC PANEL
Anion gap: 9 (ref 5–15)
BUN: 14 mg/dL (ref 6–20)
CHLORIDE: 106 mmol/L (ref 101–111)
CO2: 24 mmol/L (ref 22–32)
CREATININE: 0.93 mg/dL (ref 0.44–1.00)
Calcium: 8.9 mg/dL (ref 8.9–10.3)
GFR calc Af Amer: >60 mL/min (ref >60)
GFR calc non Af Amer: 58 mL/min — ABNORMAL LOW (ref >60)
Glucose, Bld: 169 mg/dL — ABNORMAL HIGH (ref 65–99)
Potassium: 4.1 mmol/L (ref 3.5–5.1)
Sodium: 139 mmol/L (ref 135–145)

## 2015-11-22 LAB — GLUCOSE, CAPILLARY
Glucose-Capillary: 170 mg/dL — ABNORMAL HIGH (ref 65–99)
Glucose-Capillary: 167 mg/dL — ABNORMAL HIGH (ref 65–99)
Glucose-Capillary: 140 mg/dL — ABNORMAL HIGH (ref 65–99)

## 2015-11-22 NOTE — Progress Notes (Signed)
ABN NOTICE GIVEN TO HUSBAND.

## 2015-11-22 NOTE — Progress Notes (Signed)
MRI called this RN and stated that because the pt has an ICD, she is unable to have a MRI at [facility]. If she needs the MRI it will need to be done at [facility].

## 2015-11-22 NOTE — Progress Notes (Signed)
PROGRESS NOTE    Ashlee [name]  ZOX:[id number] DOB: 1940-05-13 DOA: 11/20/2015 PCP: Junious Silk, MD    Brief Narrative:  Ashlee [name] is a 75 y.o. female with medical history significant of hypertension, anemia, dementia, diabetes mellitus, presents with not able to ambulate since two days prior to admission on arrival to ED. She was found to be in acute on chronic renal failure.   Assessment & Plan:   Active Problems:   AKI (acute kidney injury) (HCC)   Acute renal failure (ARF) (HCC)   ACUTE RENAL FAILURE: - probably secondary to dehydration from decreased po intake, renal parameters improved with hydration and are back to baseline today.  - stopped IV fluds as she has a h/o of chronic systolic CHF.  - hold nephrotoxic agents.  - hold spironolactone.  - continue to monitor renal parameters.    Chronic systolic heart failure: Appears compensated.    Diabetes mellitus: CBG (last 3)   Recent Labs  11/22/15 0754 11/22/15 1215 11/22/15 1700  GLUCAP 170* 167* 140*    Change to moderate scale SSI. Resume [place].   Deconditioning, unable to ambulate: Physical therapy consulted recommended SNF, but unfortunately, insurance not able to cover it.  She will be discharged home with home PHysical therapy once her caretaker decides.    Altered mental status: Husband reports that she is at baseline. She has dementia.  No agitation.   Hypertension: controlled.   CAD: S/p CABG in 2014.  Currently denies any chest pain.    rigth lower extremity swelling: duplex ruled out DVT.  X rays of the right lower extremity and hip  X rays are negative.  She reports severe pain on moving at her knees.  X rays of the knees ordered to evaluate for effusion .   Hypokalemia: repleted as needed. Repeat level normal.   DVT prophylaxis: (Lovenox/) Code Status: DNR.  Family Communication: none at bedside.  Disposition Plan: possibly home in am.    Consultants:  None.     Procedures: none.    Antimicrobials: none.    Subjective: No complaints.   Objective: Vitals:   11/21/15 1400 11/21/15 2140 11/22/15 0608 11/22/15 1353  BP: (!) 138/56 90/62 (!) 144/64 115/65  Pulse: 84 79 76 71  Resp: 20 18 18 18   Temp: 98.4 F (36.9 C) 98.8 F (37.1 C) 97.8 F (36.6 C) 98.1 F (36.7 C)  TempSrc: Oral Oral Oral Oral  SpO2: 100% 100% 100% 100%  Weight:      Height:        Intake/Output Summary (Last 24 hours) at 11/22/15 1722 Last data filed at 11/22/15 0800  Gross per 24 hour  Intake           391.25 ml  Output                0 ml  Net           391.25 ml   Filed Weights   11/20/15 1217 11/20/15 1934  Weight: 92.1 kg (203 lb) 92.1 kg (203 lb)    Examination:  General exam: Appears calm and comfortable  Respiratory system: Clear to auscultation. Respiratory effort normal. Cardiovascular system: S1 & S2 heard, RRR. No JVD, murmurs, rubs, gallops or clicks. No pedal edema. Gastrointestinal system: Abdomen is nondistended, soft and nontender. No organomegaly or masses felt. Normal bowel sounds heard. Central nervous system: Alert and confused.  Skin: No rashes, lesions or ulcers Muskuloskeletal: Right lower extremity swelling.  Data Reviewed: I have personally reviewed following labs and imaging studies  CBC:  Recent Labs Lab 11/20/15 1444  WBC 11.0*  NEUTROABS 9.0*  HGB 13.4  HCT 40.4  MCV 81.5  PLT 167   Basic Metabolic Panel:  Recent Labs Lab 11/20/15 1444 11/21/15 0504 11/22/15 0848  NA 135 139 139  K 3.9 3.2* 4.1  CL 100* 104 106  CO2 23 25 24   GLUCOSE 290* 192* 169*  BUN 28* 23* 14  CREATININE 1.57* 1.22* 0.93  CALCIUM 9.3 8.8* 8.9   GFR: Estimated Creatinine Clearance: 55.7 mL/min (by C-G formula based on SCr of 0.93 mg/dL). Liver Function Tests: No results for input(s): AST, ALT, ALKPHOS, BILITOT, PROT, ALBUMIN in the last 168 hours. No results for input(s): LIPASE, AMYLASE in the last 168 hours. No  results for input(s): AMMONIA in the last 168 hours. Coagulation Profile: No results for input(s): INR, PROTIME in the last 168 hours. Cardiac Enzymes:  Recent Labs Lab 11/20/15 1444  CKTOTAL 329*   BNP (last 3 results) No results for input(s): PROBNP in the last 8760 hours. HbA1C: No results for input(s): HGBA1C in the last 72 hours. CBG:  Recent Labs Lab 11/21/15 1644 11/21/15 2145 11/22/15 0754 11/22/15 1215 11/22/15 1700  GLUCAP 133* 140* 170* 167* 140*   Lipid Profile: No results for input(s): CHOL, HDL, LDLCALC, TRIG, CHOLHDL, LDLDIRECT in the last 72 hours. Thyroid Function Tests: No results for input(s): TSH, T4TOTAL, FREET4, T3FREE, THYROIDAB in the last 72 hours. Anemia Panel: No results for input(s): VITAMINB12, FOLATE, FERRITIN, TIBC, IRON, RETICCTPCT in the last 72 hours. Sepsis Labs: No results for input(s): PROCALCITON, LATICACIDVEN in the last 168 hours.  No results found for this or any previous visit (from the past 240 hour(s)).       Radiology Studies: Dg Tibia/fibula Right  Result Date: 11/20/2015 CLINICAL DATA:  Female who presents to the emergency department with her husband and cousin who report the patient has not been able to walk for the past 5 days. She's been complaining of some right leg pain and they have noticed some right lower extremity edema. The patient has a history of dementia and is usually demented. They report she's been mentating at her baseline. She was admitted to the hospital earlier this month for some confusion, UTI and peripheral edema. In the hospital they ruled out a DVT as well as a fracture to her hip using x-ray and CT. EXAM: RIGHT TIBIA AND FIBULA - 2 VIEW COMPARISON:  None. FINDINGS: No fracture.  No bone lesion. Knee joint is normally aligned. There is marginal spurring consistent with osteoarthritis. Ankle joint is normally spaced and aligned. There is mild subcutaneous soft tissue edema diffusely. Surgical vascular  clips are noted along the medial aspect of the knee. IMPRESSION: No fracture or bone lesion. No dislocation. Knee joint osteoarthritis. Electronically Signed   By: Amie Portlandavid  Ormond M.D.   On: 11/20/2015 19:27   Dg Knee Complete 4 Views Left  Result Date: 11/22/2015 CLINICAL DATA:  Knee pain.  No reported injury .  Dementia. EXAM: LEFT KNEE - COMPLETE 4+ VIEW COMPARISON:  No recent prior. FINDINGS: Degenerative changes left knee. No acute bony or joint abnormality identified. No evidence of fracture or dislocation. Multiple prominent loose bodies are noted. Soft tissue surgical clips over the lower extremity noted. IMPRESSION: Severe diffuse degenerative change with multiple loose bodies. No acute abnormality. Electronically Signed   By: Maisie Fushomas  Register   On: 11/22/2015 13:24   Dg  Knee Complete 4 Views Right  Result Date: 11/22/2015 CLINICAL DATA:  Knee pain.  No reported injury.  Dementia. EXAM: RIGHT KNEE - COMPLETE 4+ VIEW COMPARISON:  No recent prior. FINDINGS: Tricompartment degenerative changes. Loose bodies noted. Surgical clips noted over the soft tissues about the right knee. IMPRESSION: Degenerative changes right knee with loose bodies. No acute bony abnormality identified. Electronically Signed   By: Maisie Fus  Register   On: 11/22/2015 13:25   Dg Hips Bilat With Pelvis 2v  Result Date: 11/20/2015 CLINICAL DATA:  Female who presents to the emergency department with her husband and cousin who report the patient has not been able to walk for the past 5 days. She's been complaining of some right leg pain and they have noticed some right lower extremity edema. The patient has a history of dementia and is usually demented. They report she's been mentating at her baseline. She was admitted to the hospital earlier this month for some confusion, UTI and peripheral edema. In the hospital they ruled out a DVT as well as a fracture to her hip using x-ray and CT. EXAM: DG HIP (WITH OR WITHOUT PELVIS) 2V BILAT  COMPARISON:  None. FINDINGS: No fracture.  No bone lesion. Hip joints, SI joints and symphysis pubis are normally spaced and aligned. Soft tissues are unremarkable. IMPRESSION: Negative. Electronically Signed   By: Amie Portland M.D.   On: 11/20/2015 19:26        Scheduled Meds: . aspirin EC  81 mg Oral Daily  . carvedilol  3.125 mg Oral BID WC  . diclofenac sodium  2 g Topical QID  . enoxaparin (LOVENOX) injection  40 mg Subcutaneous Q24H  . exenatide  10 mcg Subcutaneous BID WC  . insulin aspart  0-15 Units Subcutaneous TID WC  . insulin aspart  0-5 Units Subcutaneous QHS  . rosuvastatin  40 mg Oral q1800   Continuous Infusions:     LOS: 0 days    Time spent: 25 minutes.    Kathlen Mody, MD [facility] Pager [phone]  If 7PM-7AM, please contact night-coverage [email or web address] Password [email or web address] 11/22/2015, 5:22 PM

## 2015-11-22 NOTE — Evaluation (Signed)
Physical Therapy Evaluation Patient Details Name: Carollee Leitzhyllis H Scherman MRN: [id number] DOB: 04/10/1940 Today's Date: 11/22/2015   History of Present Illness  Patient is a 75 yo female with recent admission 10/26/15 for acute on chronic CHF and admitted 11/20/15 for acute on chronic renal failure.   PMH:  CHF, CAD, MI, CABG, ICD, DM, HTN, memory loss/confusion, HOH  Clinical Impression  Pt admitted with above diagnosis. Pt currently with functional limitations due to the deficits listed below (see PT Problem List).  Pt will benefit from skilled PT to increase their independence and safety with mobility to allow discharge to the venue listed below.   Pt with recent admission and appears to present similiarly to last acute PT note.  Spouse not present so information gathered from chart review.  Pt will likely require increased care upon d/c.  Uncertain at this time if pt would benefit from further PT services due to cognition.     Follow Up Recommendations SNF;Supervision/Assistance - 24 hour    Equipment Recommendations  Wheelchair (measurements PT);Hospital bed;Other (comment) (mechanical lift)    Recommendations for Other Services       Precautions / Restrictions Precautions Precautions: Fall      Mobility  Bed Mobility Overal bed mobility: Needs Assistance;+2 for physical assistance Bed Mobility: Supine to Sit;Sit to Supine     Supine to sit: HOB elevated;+2 for physical assistance;Max assist Sit to supine: Total assist;+2 for physical assistance   General bed mobility comments: pt attempting to reach for therapists hands for providing multimodal cues to perform task, requires increased assist and at times resistant to movement, utilized bed pad for positioning, pt leaned against HOB upon sitting, never fully achieved upright posture despite multimodal cues  Transfers                 General transfer comment: NT for safety  Ambulation/Gait                Stairs            Wheelchair Mobility    Modified Rankin (Stroke Patients Only)       Balance                                             Pertinent Vitals/Pain Pain Assessment: Faces Faces Pain Scale: Hurts even more Pain Location: LEs, seems to be painful in knees with movement Pain Descriptors / Indicators: Grimacing;Guarding Pain Intervention(s): Monitored during session    Home Living Family/patient expects to be discharged to:: Skilled nursing facility Living Arrangements: Spouse/significant other Available Help at Discharge: Family;Available 24 hours/day             Additional Comments: Patient unable to provide information; above per recent admission notes    Prior Function           Comments: "RN speaking with husband on phone.  Husband reports patient ambulatory with no assistive device until Friday (10/25/15).  He reports patient had increased swelling in legs.  Reports she did have a fall in July.   Per chart, husband providing medicine management due to decreased cognition." - per last admission notes     Hand Dominance        Extremity/Trunk Assessment   Upper Extremity Assessment: Difficult to assess due to impaired cognition           Lower Extremity Assessment: Difficult  to assess due to impaired cognition;RLE deficits/detail;LLE deficits/detail RLE Deficits / Details: unable to follow verbal/tactile commands.  Attempted to passively move LE and patient cries out in pain, grabbing at knee LLE Deficits / Details: unable to follow verbal/tactile commands.  Attempted to passively move LE and patient cries out in pain, grabbing at knee     Communication   Communication: HOH  Cognition Arousal/Alertness: Awake/alert Behavior During Therapy: WFL for tasks assessed/performed Overall Cognitive Status: Impaired/Different from baseline Area of Impairment: Orientation;Attention;Memory;Following commands;Safety/judgement Orientation Level:  Disoriented to;Time;Situation;Place;Person Current Attention Level: Focused Memory: Decreased short-term memory Following Commands: Follows one step commands inconsistently Safety/Judgement: Decreased awareness of safety;Decreased awareness of deficits   Problem Solving: Slow processing;Difficulty sequencing;Requires tactile cues;Decreased initiation General Comments: "normally is aware of self but that confusion has been getting worse for some time" per spouse from recent admission notes    General Comments      Exercises     Assessment/Plan    PT Assessment Patient needs continued PT services  PT Problem List Decreased strength;Decreased activity tolerance;Decreased balance;Decreased mobility;Decreased cognition;Decreased knowledge of use of DME;Pain          PT Treatment Interventions DME instruction;Gait training;Functional mobility training;Therapeutic activities;Patient/family education;Therapeutic exercise    PT Goals (Current goals can be found in the Care Plan section)  Acute Rehab PT Goals PT Goal Formulation: Patient unable to participate in goal setting Time For Goal Achievement: 11/29/15 Potential to Achieve Goals: Fair    Frequency Min 2X/week   Barriers to discharge        Co-evaluation               End of Session   Activity Tolerance: Patient limited by pain (appears to be limited by pain and cognition) Patient left: in bed;with call bell/phone within reach;with bed alarm set;with family/visitor present Nurse Communication: Mobility status    Functional Assessment Tool Used: Clinical judgement Functional Limitation: Mobility: Walking and moving around Mobility: Walking and Moving Around Current Status (G9211): At least 80 percent but less than 100 percent impaired, limited or restricted Mobility: Walking and Moving Around Goal Status [phone]): At least 40 percent but less than 60 percent impaired, limited or restricted    Time: 0921-0935 PT  Time Calculation (min) (ACUTE ONLY): 14 min   Charges:   PT Evaluation $PT Eval Moderate Complexity: 1 Procedure     PT G Codes:   PT G-Codes **NOT FOR INPATIENT CLASS** Functional Assessment Tool Used: Clinical judgement Functional Limitation: Mobility: Walking and moving around Mobility: Walking and Moving Around Current Status (Y8144): At least 80 percent but less than 100 percent impaired, limited or restricted Mobility: Walking and Moving Around Goal Status [phone]): At least 40 percent but less than 60 percent impaired, limited or restricted    Jandiel Magallanes,KATHrine E 11/22/2015, 10:03 AM Zenovia Jarred, PT, DPT 11/22/2015 Pager: [phone]

## 2015-11-22 NOTE — Progress Notes (Signed)
Pt discharging home with [facility] referral given to in house rep.

## 2015-11-22 NOTE — Progress Notes (Signed)
CM and CSW spoke with pt's husband concerning discharge plans. HH,ALF,SNF, Private Duty sitters.

## 2015-11-22 NOTE — Progress Notes (Addendum)
LCSW following up with patient husband with regards to disposition. LCSW met with CM and husband and had lengthy conversation regarding patient's status with SNF and authorization.    Patient just released from SNF on 11/18/15 and has exhausted her Medicare UHC days, thus has no coverage. It is apparent that she needs more care than husband can give and patient would have to be private pay at facility if returning to SNF. Husband reports he does not think this is feesible at this time with finances. LCSW discussed and explored the option of ALF placement.   Husband is interested and has been given a letter regarding options. Family is coming to bring his list and review with LCSW. She does not have a primary dx of dementia at this time per her HP or husband, but he feels this is contributing to her discharging from the SNF and remaining in the wheelchair until she was readmitted to the hospital.  He reports she just refused to get up.  LCSW will explore options of home health, private duty sitters, and family if placement is not an option.  12:25 PM LCSW spoke at length again with husband and his cousin who arrived to hospital. Explained and educated about the different between [place] and medicaid and how to purse medicaid. 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  LCSW reviewed list of options with patient and cousin regarding ALF, private duty sitters, home health, and other SNFs paying privately. At this time family is exploring medical home health and private duty sitters until they can finalize plans regarding ALF/memory care unit. Per family, patient was at [facility] from 9-6  To 9/26.  Patient was walking lengths of the nursing home and then when she got home she refused.  When she was first admitted she refused to walk and then three days later she got up and was independent.  She  does have memory care problems, but there is no formal dx of ALZ or dementia, thus limiting patient to options for locked units in which fmaily reports she needs as she wanders.   CM updated regarding barriers to DC and options. She will follow up with home health options. Family encouraged to reach out to PCP for dx and also follow up with ALFs/GH from list regarding long term placement.  Short term, family will have to stay with her to help husband or hire an agency for around the clock assistance.   Lane Hacker, MSW Clinical Social Work: Printmaker Coverage for :  [phone]

## 2015-11-25 NOTE — Discharge Summary (Addendum)
Physician Discharge Summary  MIRZA KIDNEY QIO:[id number] DOB: 1940-09-02 DOA: 11/20/2015  PCP: Junious Silk, MD  Admit date: 11/20/2015 Discharge date: 11/22/2015  Admitted From: Home.  Disposition:  Home.   Recommendations for Outpatient Follow-up:  1. Follow up with PCP in 1-2 weeks 2. Please obtain BMP/CBC in one week 3. Please follow up with neurology in 1 to 2 weeks.   Home Health:yes  Discharge Condition:guarded.  CODE STATUS: DNR Diet recommendation: regular.   Brief/Interim Summary: Ashlee [name] a 75 y.o.femalewith medical history significant of hypertension, anemia, dementia, diabetes mellitus, presents with not able to ambulate since two days prior to admission on arrival to ED. She was found to be in acute on chronic renal failure.   Discharge Diagnoses:  Active Problems:   Hypertension   Insulin dependent diabetes mellitus (HCC)   Ischemic cardiomyopathy   Moderate dementia without behavioral disturbance   AKI (acute kidney injury) (HCC)   Acute renal failure (ARF) (HCC)  ACUTE RENAL FAILURE: - probably secondary to dehydration from decreased po intake, renal parameters improved with hydration and are back to baseline today.  - stopped IV fluds as she has a h/o of chronic systolic CHF.  - repeat creatinine is normal.    Chronic systolic heart failure: Appears compensated.    Diabetes mellitus: CBG (last 3)   Recent Labs (last 2 labs)    Recent Labs  11/22/15 0754 11/22/15 1215 11/22/15 1700  GLUCAP 170* 167* 140*      Change to moderate scale SSI. Resume [place].   Deconditioning, unable to ambulate: Physical therapy consulted recommended SNF, recommend SNF on discharge.     Altered mental status/ worsening of her dementia:  Husband reports that she is at baseline. She will need placement in the ALF or SNF.  She has dementia.  No agitation.   Hypertension: controlled.   CAD: S/p CABG in 2014.  Currently  denies any chest pain.    rigth lower extremity swelling: duplex ruled out DVT.  X rays of the right lower extremity and hip  X rays are negative.  She reports severe pain on moving at her knees.  X rays of the knees ordered to evaluate for effusion , negative for effusion.   Hypokalemia: repleted as needed. Repeat level normal.    Discharge Instructions  Discharge Instructions    Diet general    Complete by:  As directed    Discharge instructions    Complete by:  As directed    Please follow up with PCP in one week. Please follow up with neurology in 1 to 2 weeks.       Medication List    STOP taking these medications   losartan 25 MG tablet Commonly known as:  COZAAR   predniSONE 10 MG tablet Commonly known as:  DELTASONE     TAKE these medications   aspirin 81 MG tablet Take 81 mg by mouth daily.   BEPREVE 1.5 % Soln Generic drug:  Bepotastine Besilate Place 1 drop into both eyes daily as needed (EYES).   BYETTA 10 MCG PEN 10 MCG/0.04ML Sopn injection Generic drug:  exenatide Inject 10 mcg into the skin 2 (two) times daily with a meal. bid   carvedilol 6.25 MG tablet Commonly known as:  COREG Take 6.25 mg by mouth 2 (two) times daily with a meal. What changed:  Another medication with the same name was removed. Continue taking this medication, and follow the directions you see here.   CENTRUM  SILVER ADULT 50+ PO Take 1 tablet by mouth daily.   diclofenac sodium 1 % Gel Commonly known as:  VOLTAREN Apply 2 g topically 4 (four) times daily.   ergocalciferol 50000 units capsule Commonly known as:  VITAMIN D2 Take 50,000 Units by mouth once a week.   glimepiride 2 MG tablet Commonly known as:  AMARYL Take 2 mg by mouth daily.   insulin NPH Human 100 UNIT/ML injection Commonly known as:  HUMULIN N,NOVOLIN N Inject 5 Units into the skin at bedtime.   Iron 325 (65 Fe) MG Tabs Take 1 tablet by mouth daily.   LORazepam 0.5 MG tablet Commonly known  as:  ATIVAN Take 1 tablet (0.5 mg total) by mouth every 8 (eight) hours as needed for anxiety.   metFORMIN 500 MG tablet Commonly known as:  GLUCOPHAGE Take 500 mg by mouth 2 (two) times daily with a meal.   rosuvastatin 40 MG tablet Commonly known as:  CRESTOR Take 1 tablet (40 mg total) by mouth daily.   SMARTEST TEST test strip Generic drug:  glucose blood 1 each by Other route 2 (two) times daily. Use as instructed   spironolactone 25 MG tablet Commonly known as:  ALDACTONE Take 0.5 tablets (12.5 mg total) by mouth daily.   traMADol 50 MG tablet Commonly known as:  ULTRAM Take 0.5 tablets (25 mg total) by mouth every 6 (six) hours as needed for moderate pain.      Follow-up Information    ALTHEIMER,MICHAEL D, MD. Schedule an appointment as soon as possible for a visit in 1 week(s).   Specialty:  Endocrinology Contact information: [facility] [place] [address] [phone]        [facility] .   Specialty:  Home Health Services Why:  [facility] will call you. Contact information: [address] [place] [address] [phone]          Allergies  Allergen Reactions  . Demerol Nausea And Vomiting    Perfuse vomitting  . Percodan [Oxycodone-Aspirin] Nausea And Vomiting    Perfuse vomitting    Consultations:  None    Procedures/Studies: Dg Chest 2 View  Result Date: 10/26/2015 CLINICAL DATA:  Altered mental status.  Coronary artery disease. EXAM: CHEST  2 VIEW COMPARISON:  09/29/2013 FINDINGS: Heart size is stable. Aortic atherosclerosis. AICD remains in appropriate position. Prior CABG again noted. There left basilar scarring and elevation of left hemidiaphragm are stable. No evidence of acute infiltrate or pulmonary edema. No evidence of pleural effusion or pneumothorax. IMPRESSION: Stable left basilar scarring.  No acute findings. Electronically Signed   By: Myles Rosenthal M.D.   On: 10/26/2015 16:51   Dg Tibia/fibula  Right  Result Date: 11/20/2015 CLINICAL DATA:  Female who presents to the emergency department with her husband and cousin who report the patient has not been able to walk for the past 5 days. She's been complaining of some right leg pain and they have noticed some right lower extremity edema. The patient has a history of dementia and is usually demented. They report she's been mentating at her baseline. She was admitted to the hospital earlier this month for some confusion, UTI and peripheral edema. In the hospital they ruled out a DVT as well as a fracture to her hip using x-ray and CT. EXAM: RIGHT TIBIA AND FIBULA - 2 VIEW COMPARISON:  None. FINDINGS: No fracture.  No bone lesion. Knee joint is normally aligned. There is marginal spurring consistent  with osteoarthritis. Ankle joint is normally spaced and aligned. There is mild subcutaneous soft tissue edema diffusely. Surgical vascular clips are noted along the medial aspect of the knee. IMPRESSION: No fracture or bone lesion. No dislocation. Knee joint osteoarthritis. Electronically Signed   By: Amie Portland M.D.   On: 11/20/2015 19:27   Ct Hip Left Wo Contrast  Result Date: 10/27/2015 CLINICAL DATA:  BILATERAL hip pain EXAM: CT OF THE LEFT HIP WITHOUT CONTRAST CT OF THE RIGHT HIP WITHOUT CONTRAST TECHNIQUE: Multidetector CT imaging of the left hip was performed according to the standard protocol. Multiplanar CT image reconstructions were also generated. COMPARISON:  Pelvic and LEFT hip radiographs 10/27/2015 FINDINGS: Bones/Joint/Cartilage Hip and SI joints symmetric and preserved. Sclerotic focus LEFT ilium adjacent SI joint 15 mm greatest size. No fracture, dislocation, or bone destruction. Significant multifactorial spinal stenosis at L4-L5 due to bulging disc, facet hypertrophy and ligamentum flavum thickening. Bulging disc versus broad-based disc herniation at L5-S1. Ligaments Suboptimally assessed by CT. Muscles and Tendons Symmetric muscular  planes. Soft tissues Scattered atherosclerotic calcifications bilaterally. Visualized portion of appendix normal. Minimal sigmoid diverticulosis; bowel loops otherwise grossly normal for exam lacking IV and oral contrast. Normal appearing bladder, distal ureters, uterus. No free air or free fluid in pelvis. IMPRESSION: Severe multifactorial central acquired spinal stenosis at L4-L5. Bulging tears versus broad-based disc herniation L5-S1 indenting thecal sac. Sclerotic focus that LEFT ilium adjacent to the SI joint, very dense and sharply defined, favor bone island. No definite acute osseous abnormalities. Minimal sigmoid diverticulosis. Electronically Signed   By: Ulyses Southward M.D.   On: 10/27/2015 20:58   Ct Hip Right Wo Contrast  Result Date: 10/27/2015 CLINICAL DATA:  BILATERAL hip pain EXAM: CT OF THE LEFT HIP WITHOUT CONTRAST CT OF THE RIGHT HIP WITHOUT CONTRAST TECHNIQUE: Multidetector CT imaging of the left hip was performed according to the standard protocol. Multiplanar CT image reconstructions were also generated. COMPARISON:  Pelvic and LEFT hip radiographs 10/27/2015 FINDINGS: Bones/Joint/Cartilage Hip and SI joints symmetric and preserved. Sclerotic focus LEFT ilium adjacent SI joint 15 mm greatest size. No fracture, dislocation, or bone destruction. Significant multifactorial spinal stenosis at L4-L5 due to bulging disc, facet hypertrophy and ligamentum flavum thickening. Bulging disc versus broad-based disc herniation at L5-S1. Ligaments Suboptimally assessed by CT. Muscles and Tendons Symmetric muscular planes. Soft tissues Scattered atherosclerotic calcifications bilaterally. Visualized portion of appendix normal. Minimal sigmoid diverticulosis; bowel loops otherwise grossly normal for exam lacking IV and oral contrast. Normal appearing bladder, distal ureters, uterus. No free air or free fluid in pelvis. IMPRESSION: Severe multifactorial central acquired spinal stenosis at L4-L5. Bulging tears  versus broad-based disc herniation L5-S1 indenting thecal sac. Sclerotic focus that LEFT ilium adjacent to the SI joint, very dense and sharply defined, favor bone island. No definite acute osseous abnormalities. Minimal sigmoid diverticulosis. Electronically Signed   By: Ulyses Southward M.D.   On: 10/27/2015 20:58   Dg Knee Complete 4 Views Left  Result Date: 11/22/2015 CLINICAL DATA:  Knee pain.  No reported injury .  Dementia. EXAM: LEFT KNEE - COMPLETE 4+ VIEW COMPARISON:  No recent prior. FINDINGS: Degenerative changes left knee. No acute bony or joint abnormality identified. No evidence of fracture or dislocation. Multiple prominent loose bodies are noted. Soft tissue surgical clips over the lower extremity noted. IMPRESSION: Severe diffuse degenerative change with multiple loose bodies. No acute abnormality. Electronically Signed   By: Maisie Fus  Register   On: 11/22/2015 13:24   Dg Knee Complete 4  Views Right  Result Date: 11/22/2015 CLINICAL DATA:  Knee pain.  No reported injury.  Dementia. EXAM: RIGHT KNEE - COMPLETE 4+ VIEW COMPARISON:  No recent prior. FINDINGS: Tricompartment degenerative changes. Loose bodies noted. Surgical clips noted over the soft tissues about the right knee. IMPRESSION: Degenerative changes right knee with loose bodies. No acute bony abnormality identified. Electronically Signed   By: Maisie Fushomas  Register   On: 11/22/2015 13:25   Dg Hips Bilat With Pelvis 2v  Result Date: 11/20/2015 CLINICAL DATA:  Female who presents to the emergency department with her husband and cousin who report the patient has not been able to walk for the past 5 days. She's been complaining of some right leg pain and they have noticed some right lower extremity edema. The patient has a history of dementia and is usually demented. They report she's been mentating at her baseline. She was admitted to the hospital earlier this month for some confusion, UTI and peripheral edema. In the hospital they ruled out  a DVT as well as a fracture to her hip using x-ray and CT. EXAM: DG HIP (WITH OR WITHOUT PELVIS) 2V BILAT COMPARISON:  None. FINDINGS: No fracture.  No bone lesion. Hip joints, SI joints and symphysis pubis are normally spaced and aligned. Soft tissues are unremarkable. IMPRESSION: Negative. Electronically Signed   By: Amie Portlandavid  Ormond M.D.   On: 11/20/2015 19:26   Dg Hip Unilat With Pelvis 2-3 Views Left  Result Date: 10/27/2015 CLINICAL DATA:  Left leg edema EXAM: DG HIP (WITH OR WITHOUT PELVIS) 2-3V LEFT COMPARISON:  None. FINDINGS: No fracture or dislocation is seen. Bilateral hip joint spaces are preserved. Visualized bony pelvis appears intact. IMPRESSION: No acute osseus abnormality is seen. Electronically Signed   By: Charline BillsSriyesh  Krishnan M.D.   On: 10/27/2015 14:41      Subjective: No new complaints.   Discharge Exam: Vitals:   11/22/15 1353 11/22/15 2145  BP: 115/65 113/86  Pulse: 71 65  Resp: 18 18  Temp: 98.1 F (36.7 C) 98.5 F (36.9 C)   Vitals:   11/21/15 2140 11/22/15 0608 11/22/15 1353 11/22/15 2145  BP: 90/62 (!) 144/64 115/65 113/86  Pulse: 79 76 71 65  Resp: 18 18 18 18   Temp: 98.8 F (37.1 C) 97.8 F (36.6 C) 98.1 F (36.7 C) 98.5 F (36.9 C)  TempSrc: Oral Oral Oral Oral  SpO2: 100% 100% 100% 100%  Weight:      Height:        General: Pt is alert, awake, not in acute distress Cardiovascular: RRR, S1/S2 +, no rubs, no gallops Respiratory: CTA bilaterally, no wheezing, no rhonchi Abdominal: Soft, NT, ND, bowel sounds + Extremities: no edema, no cyanosis    The results of significant diagnostics from this hospitalization (including imaging, microbiology, ancillary and laboratory) are listed below for reference.     Microbiology: No results found for this or any previous visit (from the past 240 hour(s)).   Labs: BNP (last 3 results)  Recent Labs  10/26/15 1555  BNP 505.5*   Basic Metabolic Panel:  Recent Labs Lab 11/20/15 1444 11/21/15 0504  11/22/15 0848  NA 135 139 139  K 3.9 3.2* 4.1  CL 100* 104 106  CO2 23 25 24   GLUCOSE 290* 192* 169*  BUN 28* 23* 14  CREATININE 1.57* 1.22* 0.93  CALCIUM 9.3 8.8* 8.9   Liver Function Tests: No results for input(s): AST, ALT, ALKPHOS, BILITOT, PROT, ALBUMIN in the last 168 hours. No results  for input(s): LIPASE, AMYLASE in the last 168 hours. No results for input(s): AMMONIA in the last 168 hours. CBC:  Recent Labs Lab 11/20/15 1444  WBC 11.0*  NEUTROABS 9.0*  HGB 13.4  HCT 40.4  MCV 81.5  PLT 167   Cardiac Enzymes:  Recent Labs Lab 11/20/15 1444  CKTOTAL 329*   BNP: Invalid input(s): POCBNP CBG:  Recent Labs Lab 11/21/15 1644 11/21/15 2145 11/22/15 0754 11/22/15 1215 11/22/15 1700  GLUCAP 133* 140* 170* 167* 140*   D-Dimer No results for input(s): DDIMER in the last 72 hours. Hgb A1c No results for input(s): HGBA1C in the last 72 hours. Lipid Profile No results for input(s): CHOL, HDL, LDLCALC, TRIG, CHOLHDL, LDLDIRECT in the last 72 hours. Thyroid function studies No results for input(s): TSH, T4TOTAL, T3FREE, THYROIDAB in the last 72 hours.  Invalid input(s): FREET3 Anemia work up No results for input(s): VITAMINB12, FOLATE, FERRITIN, TIBC, IRON, RETICCTPCT in the last 72 hours. Urinalysis    Component Value Date/Time   COLORURINE AMBER (A) 11/20/2015 1349   APPEARANCEUR CLEAR 11/20/2015 1349   LABSPEC 1.036 (H) 11/20/2015 1349   PHURINE 6.0 11/20/2015 1349   GLUCOSEU >1000 (A) 11/20/2015 1349   HGBUR SMALL (A) 11/20/2015 1349   BILIRUBINUR SMALL (A) 11/20/2015 1349   KETONESUR NEGATIVE 11/20/2015 1349   PROTEINUR 100 (A) 11/20/2015 1349   UROBILINOGEN 1.0 07/09/2012 2227   NITRITE NEGATIVE 11/20/2015 1349   LEUKOCYTESUR NEGATIVE 11/20/2015 1349   Sepsis Labs Invalid input(s): PROCALCITONIN,  WBC,  LACTICIDVEN Microbiology No results found for this or any previous visit (from the past 240 hour(s)).   Time coordinating discharge:  Over 30 minutes  SIGNED:   Kathlen Mody, MD  [facility] 11/25/2015, 9:21 AM Pager   If 7PM-7AM, please contact night-coverage [email or web address] Password [email or web address]

## 2015-12-03 ENCOUNTER — Inpatient Hospital Stay (HOSPITAL_COMMUNITY): Payer: Medicare Other

## 2015-12-03 ENCOUNTER — Encounter (HOSPITAL_COMMUNITY): Payer: Self-pay

## 2015-12-03 ENCOUNTER — Emergency Department (HOSPITAL_COMMUNITY): Payer: Medicare Other

## 2015-12-03 ENCOUNTER — Inpatient Hospital Stay (HOSPITAL_COMMUNITY)
Admission: EM | Admit: 2015-12-03 | Discharge: 2015-12-05 | DRG: 871 | Disposition: A | Discharge status: Hospice | Payer: Medicare Other | Attending: Internal Medicine | Admitting: Internal Medicine

## 2015-12-03 DIAGNOSIS — L89152 Pressure ulcer of sacral region, stage 2: Secondary | ICD-10-CM | POA: Diagnosis present

## 2015-12-03 DIAGNOSIS — E118 Type 2 diabetes mellitus with unspecified complications: Secondary | ICD-10-CM

## 2015-12-03 DIAGNOSIS — Z951 Presence of aortocoronary bypass graft: Secondary | ICD-10-CM

## 2015-12-03 DIAGNOSIS — E785 Hyperlipidemia, unspecified: Secondary | ICD-10-CM | POA: Diagnosis present

## 2015-12-03 DIAGNOSIS — N390 Urinary tract infection, site not specified: Secondary | ICD-10-CM | POA: Diagnosis present

## 2015-12-03 DIAGNOSIS — I130 Hypertensive heart and chronic kidney disease with heart failure and stage 1 through stage 4 chronic kidney disease, or unspecified chronic kidney disease: Secondary | ICD-10-CM | POA: Diagnosis present

## 2015-12-03 DIAGNOSIS — M6282 Rhabdomyolysis: Secondary | ICD-10-CM | POA: Diagnosis present

## 2015-12-03 DIAGNOSIS — Z7401 Bed confinement status: Secondary | ICD-10-CM

## 2015-12-03 DIAGNOSIS — I2589 Other forms of chronic ischemic heart disease: Secondary | ICD-10-CM | POA: Diagnosis not present

## 2015-12-03 DIAGNOSIS — L8990 Pressure ulcer of unspecified site, unspecified stage: Secondary | ICD-10-CM | POA: Insufficient documentation

## 2015-12-03 DIAGNOSIS — I2510 Atherosclerotic heart disease of native coronary artery without angina pectoris: Secondary | ICD-10-CM | POA: Diagnosis present

## 2015-12-03 DIAGNOSIS — I252 Old myocardial infarction: Secondary | ICD-10-CM

## 2015-12-03 DIAGNOSIS — A419 Sepsis, unspecified organism: Secondary | ICD-10-CM | POA: Diagnosis not present

## 2015-12-03 DIAGNOSIS — R4182 Altered mental status, unspecified: Secondary | ICD-10-CM | POA: Diagnosis not present

## 2015-12-03 DIAGNOSIS — Z794 Long term (current) use of insulin: Secondary | ICD-10-CM

## 2015-12-03 DIAGNOSIS — Z515 Encounter for palliative care: Secondary | ICD-10-CM | POA: Diagnosis present

## 2015-12-03 DIAGNOSIS — N898 Other specified noninflammatory disorders of vagina: Secondary | ICD-10-CM | POA: Diagnosis present

## 2015-12-03 DIAGNOSIS — A4102 Sepsis due to Methicillin resistant Staphylococcus aureus: Secondary | ICD-10-CM | POA: Diagnosis present

## 2015-12-03 DIAGNOSIS — F0391 Unspecified dementia with behavioral disturbance: Secondary | ICD-10-CM | POA: Diagnosis present

## 2015-12-03 DIAGNOSIS — Z801 Family history of malignant neoplasm of trachea, bronchus and lung: Secondary | ICD-10-CM

## 2015-12-03 DIAGNOSIS — Z66 Do not resuscitate: Secondary | ICD-10-CM | POA: Diagnosis present

## 2015-12-03 DIAGNOSIS — G9340 Encephalopathy, unspecified: Secondary | ICD-10-CM

## 2015-12-03 DIAGNOSIS — Z7982 Long term (current) use of aspirin: Secondary | ICD-10-CM

## 2015-12-03 DIAGNOSIS — Z8249 Family history of ischemic heart disease and other diseases of the circulatory system: Secondary | ICD-10-CM | POA: Diagnosis not present

## 2015-12-03 DIAGNOSIS — F03918 Unspecified dementia, unspecified severity, with other behavioral disturbance: Secondary | ICD-10-CM | POA: Diagnosis present

## 2015-12-03 DIAGNOSIS — E1110 Type 2 diabetes mellitus with ketoacidosis without coma: Secondary | ICD-10-CM | POA: Diagnosis present

## 2015-12-03 DIAGNOSIS — E870 Hyperosmolality and hypernatremia: Secondary | ICD-10-CM | POA: Diagnosis present

## 2015-12-03 DIAGNOSIS — H9190 Unspecified hearing loss, unspecified ear: Secondary | ICD-10-CM | POA: Diagnosis present

## 2015-12-03 DIAGNOSIS — N179 Acute kidney failure, unspecified: Secondary | ICD-10-CM | POA: Diagnosis present

## 2015-12-03 DIAGNOSIS — B9562 Methicillin resistant Staphylococcus aureus infection as the cause of diseases classified elsewhere: Secondary | ICD-10-CM

## 2015-12-03 DIAGNOSIS — F0390 Unspecified dementia without behavioral disturbance: Secondary | ICD-10-CM

## 2015-12-03 DIAGNOSIS — I5022 Chronic systolic (congestive) heart failure: Secondary | ICD-10-CM | POA: Diagnosis present

## 2015-12-03 DIAGNOSIS — Z808 Family history of malignant neoplasm of other organs or systems: Secondary | ICD-10-CM

## 2015-12-03 DIAGNOSIS — R7881 Bacteremia: Secondary | ICD-10-CM | POA: Diagnosis not present

## 2015-12-03 DIAGNOSIS — E860 Dehydration: Secondary | ICD-10-CM | POA: Diagnosis present

## 2015-12-03 DIAGNOSIS — Z7189 Other specified counseling: Secondary | ICD-10-CM

## 2015-12-03 DIAGNOSIS — R6510 Systemic inflammatory response syndrome (SIRS) of non-infectious origin without acute organ dysfunction: Secondary | ICD-10-CM | POA: Diagnosis not present

## 2015-12-03 DIAGNOSIS — N189 Chronic kidney disease, unspecified: Secondary | ICD-10-CM | POA: Diagnosis present

## 2015-12-03 DIAGNOSIS — I10 Essential (primary) hypertension: Secondary | ICD-10-CM | POA: Diagnosis present

## 2015-12-03 DIAGNOSIS — Z955 Presence of coronary angioplasty implant and graft: Secondary | ICD-10-CM

## 2015-12-03 DIAGNOSIS — Z79899 Other long term (current) drug therapy: Secondary | ICD-10-CM

## 2015-12-03 DIAGNOSIS — I255 Ischemic cardiomyopathy: Secondary | ICD-10-CM | POA: Diagnosis present

## 2015-12-03 DIAGNOSIS — Z803 Family history of malignant neoplasm of breast: Secondary | ICD-10-CM | POA: Diagnosis not present

## 2015-12-03 DIAGNOSIS — Z95810 Presence of automatic (implantable) cardiac defibrillator: Secondary | ICD-10-CM | POA: Diagnosis not present

## 2015-12-03 DIAGNOSIS — M1990 Unspecified osteoarthritis, unspecified site: Secondary | ICD-10-CM | POA: Diagnosis present

## 2015-12-03 DIAGNOSIS — I2583 Coronary atherosclerosis due to lipid rich plaque: Secondary | ICD-10-CM

## 2015-12-03 LAB — CBC WITH DIFFERENTIAL/PLATELET
BASOS ABS: 0 10*3/uL (ref 0.0–0.1)
BASOS PCT: 0 %
EOS ABS: 0 10*3/uL (ref 0.0–0.7)
Eosinophils Relative: 0 %
HCT: 46.5 % — ABNORMAL HIGH (ref 36.0–46.0)
HEMOGLOBIN: 14.9 g/dL (ref 12.0–15.0)
LYMPHS PCT: 11 %
Lymphs Abs: 1.3 10*3/uL (ref 0.7–4.0)
MCH: 27.1 pg (ref 26.0–34.0)
MCHC: 32 g/dL (ref 30.0–36.0)
MCV: 84.5 fL (ref 78.0–100.0)
MONO ABS: 1 10*3/uL (ref 0.1–1.0)
Monocytes Relative: 8 %
NEUTROS PCT: 81 %
Neutro Abs: 9.7 10*3/uL — ABNORMAL HIGH (ref 1.7–7.7)
PLATELETS: 298 10*3/uL (ref 150–400)
RBC: 5.5 MIL/uL — AB (ref 3.87–5.11)
RDW: 14.6 % (ref 11.5–15.5)
WBC: 12 10*3/uL — AB (ref 4.0–10.5)

## 2015-12-03 LAB — URINALYSIS, ROUTINE W REFLEX MICROSCOPIC
Glucose, UA: 500 mg/dL — AB
Ketones, ur: 15 mg/dL — AB
LEUKOCYTES UA: NEGATIVE
NITRITE: NEGATIVE
SPECIFIC GRAVITY, URINE: 1.039 — AB (ref 1.005–1.030)
pH: 5 (ref 5.0–8.0)

## 2015-12-03 LAB — TSH: TSH: 0.684 u[IU]/mL (ref 0.350–4.500)

## 2015-12-03 LAB — COMPREHENSIVE METABOLIC PANEL
ALK PHOS: 78 U/L (ref 38–126)
ALT: 21 U/L (ref 14–54)
ANION GAP: 15 (ref 5–15)
AST: 48 U/L — ABNORMAL HIGH (ref 15–41)
Albumin: 2.4 g/dL — ABNORMAL LOW (ref 3.5–5.0)
BUN: 32 mg/dL — ABNORMAL HIGH (ref 6–20)
CALCIUM: 9.4 mg/dL (ref 8.9–10.3)
CO2: 19 mmol/L — AB (ref 22–32)
CREATININE: 1.4 mg/dL — AB (ref 0.44–1.00)
Chloride: 113 mmol/L — ABNORMAL HIGH (ref 101–111)
GFR, EST AFRICAN AMERICAN: 41 mL/min — AB (ref >60)
GFR, EST NON AFRICAN AMERICAN: 35 mL/min — AB (ref >60)
Glucose, Bld: 359 mg/dL — ABNORMAL HIGH (ref 65–99)
Potassium: 4.4 mmol/L (ref 3.5–5.1)
SODIUM: 147 mmol/L — AB (ref 135–145)
TOTAL PROTEIN: 7.7 g/dL (ref 6.5–8.1)
Total Bilirubin: 0.6 mg/dL (ref 0.3–1.2)

## 2015-12-03 LAB — ETHANOL

## 2015-12-03 LAB — TROPONIN I: Troponin I: 0.16 ng/mL (ref <0.03)

## 2015-12-03 LAB — GLUCOSE, CAPILLARY
GLUCOSE-CAPILLARY: 367 mg/dL — AB (ref 65–99)
Glucose-Capillary: 297 mg/dL — ABNORMAL HIGH (ref 65–99)

## 2015-12-03 LAB — WET PREP, GENITAL
Clue Cells Wet Prep HPF POC: NONE SEEN
Sperm: NONE SEEN
Trich, Wet Prep: NONE SEEN
Yeast Wet Prep HPF POC: NONE SEEN

## 2015-12-03 LAB — URINE MICROSCOPIC-ADD ON

## 2015-12-03 LAB — I-STAT CG4 LACTIC ACID, ED
LACTIC ACID, VENOUS: 2.12 mmol/L — AB (ref 0.5–1.9)
Lactic Acid, Venous: 2.6 mmol/L (ref 0.5–1.9)

## 2015-12-03 LAB — CK: Total CK: 2306 U/L — ABNORMAL HIGH (ref 38–234)

## 2015-12-03 LAB — AMMONIA: AMMONIA: 18 umol/L (ref 9–35)

## 2015-12-03 LAB — MRSA PCR SCREENING: MRSA by PCR: NEGATIVE

## 2015-12-03 LAB — ACETAMINOPHEN LEVEL

## 2015-12-03 MED ORDER — ORAL CARE MOUTH RINSE
15.0000 mL | Freq: Two times a day (BID) | OROMUCOSAL | Status: DC
  Administered 2015-12-04 (×2): 15 mL via OROMUCOSAL

## 2015-12-03 MED ORDER — IOPAMIDOL (ISOVUE-300) INJECTION 61%
INTRAVENOUS | Status: AC
  Administered 2015-12-03: 75 mL
  Filled 2015-12-03: qty 75

## 2015-12-03 MED ORDER — SODIUM CHLORIDE 0.9 % IV SOLN
1250.0000 mg | INTRAVENOUS | Status: DC
  Administered 2015-12-04: 1250 mg via INTRAVENOUS
  Filled 2015-12-03 (×3): qty 1250

## 2015-12-03 MED ORDER — SODIUM CHLORIDE 0.9 % IV BOLUS (SEPSIS)
1000.0000 mL | Freq: Once | INTRAVENOUS | Status: AC
  Administered 2015-12-03: 1000 mL via INTRAVENOUS

## 2015-12-03 MED ORDER — ACETAMINOPHEN 650 MG RE SUPP
650.0000 mg | Freq: Once | RECTAL | Status: AC
  Administered 2015-12-03: 650 mg via RECTAL
  Filled 2015-12-03: qty 1

## 2015-12-03 MED ORDER — INSULIN ASPART 100 UNIT/ML ~~LOC~~ SOLN
0.0000 [IU] | SUBCUTANEOUS | Status: DC
  Administered 2015-12-03: 5 [IU] via SUBCUTANEOUS
  Administered 2015-12-04: 9 [IU] via SUBCUTANEOUS
  Administered 2015-12-04: 2 [IU] via SUBCUTANEOUS
  Administered 2015-12-04: 3 [IU] via SUBCUTANEOUS
  Administered 2015-12-04: 1 [IU] via SUBCUTANEOUS
  Administered 2015-12-04: 2 [IU] via SUBCUTANEOUS
  Administered 2015-12-04: 9 [IU] via SUBCUTANEOUS
  Administered 2015-12-05 (×3): 2 [IU] via SUBCUTANEOUS

## 2015-12-03 MED ORDER — PIPERACILLIN-TAZOBACTAM 3.375 G IVPB 30 MIN
3.3750 g | Freq: Once | INTRAVENOUS | Status: AC
  Administered 2015-12-03: 3.375 g via INTRAVENOUS
  Filled 2015-12-03: qty 50

## 2015-12-03 MED ORDER — SODIUM CHLORIDE 0.9 % IV BOLUS (SEPSIS)
1500.0000 mL | Freq: Once | INTRAVENOUS | Status: AC
  Administered 2015-12-03: 1500 mL via INTRAVENOUS

## 2015-12-03 MED ORDER — ACETAMINOPHEN 650 MG RE SUPP
650.0000 mg | Freq: Four times a day (QID) | RECTAL | Status: DC | PRN
Start: 2015-12-03 — End: 2015-12-05

## 2015-12-03 MED ORDER — SODIUM CHLORIDE 0.9 % IV SOLN
Freq: Once | INTRAVENOUS | Status: AC
  Administered 2015-12-03: 17:00:00 via INTRAVENOUS

## 2015-12-03 MED ORDER — VANCOMYCIN HCL 10 G IV SOLR
2000.0000 mg | Freq: Once | INTRAVENOUS | Status: AC
  Administered 2015-12-03: 2000 mg via INTRAVENOUS
  Filled 2015-12-03: qty 2000

## 2015-12-03 MED ORDER — DEXTROSE-NACL 5-0.45 % IV SOLN
INTRAVENOUS | Status: DC
  Administered 2015-12-03: 21:00:00 via INTRAVENOUS

## 2015-12-03 MED ORDER — CHLORHEXIDINE GLUCONATE 0.12 % MT SOLN
15.0000 mL | Freq: Two times a day (BID) | OROMUCOSAL | Status: DC
  Administered 2015-12-04 – 2015-12-05 (×3): 15 mL via OROMUCOSAL
  Filled 2015-12-03 (×2): qty 15

## 2015-12-03 MED ORDER — ACETAMINOPHEN 325 MG PO TABS: 650.0000 mg | ORAL_TABLET | Freq: Four times a day (QID) | ORAL | Status: DC | PRN

## 2015-12-03 MED ORDER — PIPERACILLIN-TAZOBACTAM 3.375 G IVPB
3.3750 g | Freq: Three times a day (TID) | INTRAVENOUS | Status: DC
Start: 2015-12-03 — End: 2015-12-05
  Administered 2015-12-03 – 2015-12-04 (×4): 3.375 g via INTRAVENOUS
  Filled 2015-12-03 (×8): qty 50

## 2015-12-03 MED ORDER — SODIUM CHLORIDE 0.9% FLUSH
3.0000 mL | Freq: Two times a day (BID) | INTRAVENOUS | Status: DC
  Administered 2015-12-03 – 2015-12-04 (×2): 3 mL via INTRAVENOUS

## 2015-12-03 NOTE — ED Notes (Signed)
Attempted report x1. 

## 2015-12-03 NOTE — Progress Notes (Signed)
Pharmacy Antibiotic Note  Ashlee [name] is a 75 y.o. female admitted on 12/03/2015 with sepsis.  Pharmacy has been consulted for vancomycin and zosyn dosing. Found by EMS naked and prone covered in urine and feces. Pt does not communicate verbally d/t dementia. Tachycardic, tachypnic, temp 101.1,  Plan: Vancomycin 2000 mg IV x1 Vancomycin 1250 mg q24h Zosyn 3.275 g IV 30 min infusion x1 Zosyn 3.375 g IV q8h extended infusion Obtain vanc trough at steady state Consider de-escalation as appropriate  Height: 5\' 3"  (160 cm) Weight: 227 lb (103 kg) IBW/kg (Calculated) : 52.4  Temp (24hrs), Avg:99.5 F (37.5 C), Min:97.8 F (36.6 C), Max:101.1 F (38.4 C)   Recent Labs Lab 12/03/15 1419 12/03/15 1441  WBC 12.0*  --   CREATININE 1.40*  --   LATICACIDVEN  --  2.60*    Estimated Creatinine Clearance: 38.6 mL/min (by C-G formula based on SCr of 1.4 mg/dL (H)).   Renal function lower than baseline with usual SCr of ~0.9  Allergies  Allergen Reactions  . Demerol Nausea And Vomiting    Perfuse vomitting  . Percodan [Oxycodone-Aspirin] Nausea And Vomiting    Perfuse vomitting    Antimicrobials this admission:  Vanc 10/10 >> Zosyn 10/10 >>  Dose adjustments this admission:  N/A  Microbiology results:  10/10 BCx: Sent 10/10 UCx: Sent  Thank you for allowing pharmacy to be a part of this patient's care.  Lianne Cure, PharmD Candidate 12/03/2015 4:29 PM

## 2015-12-03 NOTE — Progress Notes (Signed)
Patient's capillary blood glucose 297.  NP Schorr informed of same.  Patient has hypernatremia, IV fluids D5 1/2NS for hypernatremia.

## 2015-12-03 NOTE — ED Notes (Signed)
Spoke with Ct they are aware of pt.s orders

## 2015-12-03 NOTE — ED Notes (Signed)
Pt transported to CT ?

## 2015-12-03 NOTE — Progress Notes (Signed)
Called to get report transferred no one ever picked up.

## 2015-12-03 NOTE — H&P (Signed)
History and Physical    Ashlee [name] ZOX:[id number] DOB: 09/16/1940 DOA: 12/03/2015  PCP: Junious SilkALTHEIMER,MICHAEL D, MD  Patient coming from: Home  Chief Complaint: AMS  HPI: Ashlee Leitzhyllis H Hesse is a 75 y.o. female with medical history significant of ischemic cardiomyopathy, reduced EF heart failure, MI, s/p CABG, hypertension, hyperlipidemia, diabetes mellitus. She presented to the emergency department with altered mental status. Patient not able to provide me with a history at time of interview and family not available for consultation. History mainly obtained from chart review.  Patient has been progressively getting weaker and less responsive over the past few days. Today, she was found to be warm and lying in her own urine and feces. EMS was called for transport to the ED.  ED Course: Vitals: Febrile to 101.64F with mild tachycardia and tachypnea requiring 2L O2 via [place]. BP soft in 100s to 120s Labs: Hypernatremia (147)/chloridemia (113) with elevated creatine (1.40). WBC of 12k.  Imaging: CXR significant for no acute process Medications/Course: 2.5L NS bolus, Vanc/zosyn  Review of Systems: Review of Systems  Unable to perform ROS: Mental status change    Past Medical History:  Diagnosis Date  . Arthritis    "in my knees" (07/06/2012)  . CAD (coronary artery disease) 1998   stent post heart attack  . DM type 2 (diabetes mellitus, type 2) (HCC)   . HOH (hard of hearing)   . Hyperlipidemia   . Hypertension   . Iron deficiency anemia   . Memory loss   . Migraines    "ages 89 thru [age]; associated w/menstral cycle" (07/06/2012)  . Moderate dementia without behavioral disturbance 10/30/2015  . Myocardial infarction 1998  . PONV (postoperative nausea and vomiting)   . Shortness of breath    "occasionally; could happen at any time" (07/06/2012)    Past Surgical History:  Procedure Laterality Date  . BREAST BIOPSY Right    "thought they saw something real tiny; it was a stitch left by OR  when breast reduced" (07/06/2012)  . CARDIAC CATHETERIZATION  07/06/2012  . CORONARY ANGIOPLASTY WITH STENT PLACEMENT  1998   LAD/notes 07/07/2012  . CORONARY ARTERY BYPASS GRAFT N/A 07/11/2012   Procedure: CORONARY ARTERY BYPASS GRAFTING (CABG);  Surgeon: Loreli SlotSteven C Hendrickson, MD;  [facility];  Service: Open Heart Surgery;  Laterality: N/A;  x4, using left internal mammary and right greater saphenous vein.   . IMPLANTABLE CARDIOVERTER DEFIBRILLATOR IMPLANT N/A 09/28/2013   Procedure: IMPLANTABLE CARDIOVERTER DEFIBRILLATOR IMPLANT;  Surgeon: Duke SalviaSteven C Klein, MD;  [facility];  Service: Cardiovascular;  Laterality: N/A;  . REDUCTION MAMMAPLASTY    . SHOULDER ARTHROSCOPY Right 07/25/2007   exam under anesthesia; arthroscopic biceps tenotomy; partial labral tear debridement; spur excision/notes 07/25/2007 (07/06/2012)  . SHOULDER HEMI-ARTHROPLASTY Right ?2009   "fell and crushed it" (07/06/2012)  . TONSILLECTOMY  1952  . TUBAL LIGATION       reports that she has never smoked. She has never used smokeless tobacco. She reports that she drinks alcohol. She reports that she does not use drugs.  Allergies  Allergen Reactions  . Demerol Nausea And Vomiting    Perfuse vomitting  . Percodan [Oxycodone-Aspirin] Nausea And Vomiting    Perfuse vomitting    Family History  Problem Relation Age of Onset  . Prostate cancer Father   . Heart attack Mother     MI at age 77  . Hypertension Mother   . Breast cancer Sister   . Throat cancer Brother   .  Lung cancer Brother     Prior to Admission medications   Medication Sig Start Date End Date Taking? Authorizing Provider  aspirin 81 MG tablet Take 81 mg by mouth daily.    Historical Provider, MD  BEPREVE 1.5 % SOLN Place 1 drop into both eyes daily as needed (EYES).  11/01/13   Historical Provider, MD  carvedilol (COREG) 6.25 MG tablet Take 6.25 mg by mouth 2 (two) times daily with a meal.    Historical Provider, MD  diclofenac sodium (VOLTAREN)  1 % GEL Apply 2 g topically 4 (four) times daily. 11/22/13   Carrington Clamp, DPM  ergocalciferol (VITAMIN D2) 50000 UNITS capsule Take 50,000 Units by mouth once a week.  11/20/10   Historical Provider, MD  exenatide (BYETTA 10 MCG PEN) 10 MCG/0.04ML SOLN Inject 10 mcg into the skin 2 (two) times daily with a meal. bid    Historical Provider, MD  Ferrous Sulfate (IRON) 325 (65 FE) MG TABS Take 1 tablet by mouth daily.    Historical Provider, MD  glimepiride (AMARYL) 2 MG tablet Take 2 mg by mouth daily.    Historical Provider, MD  glucose blood (SMARTEST TEST) test strip 1 each by Other route 2 (two) times daily. Use as instructed    Historical Provider, MD  insulin NPH (HUMULIN N,NOVOLIN N) 100 UNIT/ML injection Inject 5 Units into the skin at bedtime.     Historical Provider, MD  LORazepam (ATIVAN) 0.5 MG tablet Take 1 tablet (0.5 mg total) by mouth every 8 (eight) hours as needed for anxiety. 10/30/15   Rodolph Bong, MD  metFORMIN (GLUCOPHAGE) 500 MG tablet Take 500 mg by mouth 2 (two) times daily with a meal.    Historical Provider, MD  Multiple Vitamins-Minerals (CENTRUM SILVER ADULT 50+ PO) Take 1 tablet by mouth daily.    Historical Provider, MD  rosuvastatin (CRESTOR) 40 MG tablet Take 1 tablet (40 mg total) by mouth daily. 12/24/14   Lewayne Bunting, MD  spironolactone (ALDACTONE) 25 MG tablet Take 0.5 tablets (12.5 mg total) by mouth daily. 10/30/15   Rodolph Bong, MD  traMADol (ULTRAM) 50 MG tablet Take 0.5 tablets (25 mg total) by mouth every 6 (six) hours as needed for moderate pain. 10/30/15   Rodolph Bong, MD    Physical Exam: Vitals:   12/03/15 1545 12/03/15 1600 12/03/15 1615 12/03/15 1630  BP: 112/63 105/66 105/67 107/61  Pulse: 84 85 84 80  Resp: (!) 33 (!) 29 19 24   Temp:      TempSrc:      SpO2: 98% 98% (!) 88% 99%  Weight:      Height:         Constitutional: NAD, calm, comfortable Eyes: pupils equal, somewhat small but not pinpoint, minimally reactive,  appears to have a cataract in left eye. lids and conjunctivae normal ENMT: Mucous membranes are dry. Poor dentition  Neck: normal, supple, no masses, no thyromegaly Respiratory: clear to auscultation bilaterally, no wheezing, no crackles. Normal respiratory effort. No accessory muscle use.  Cardiovascular: Regular rate and rhythm, 2/6 systolic murmur. No extremity edema. 2+ pedal pulses. Abdomen: no tenderness, no masses palpated. No hepatosplenomegaly. Bowel sounds positive.  Musculoskeletal: no clubbing / cyanosis. No joint deformity upper and lower extremities. No contractures. Normal muscle tone.  Skin: no rashes, lesions, ulcers. No induration Neurologic: GCS of 9. Reflexes 1+ and equal Psychiatric: Patient not alert and not oriented  Labs on Admission: I have personally reviewed following labs  and imaging studies  CBC:  Recent Labs Lab 12/03/15 1419  WBC 12.0*  NEUTROABS 9.7*  HGB 14.9  HCT 46.5*  MCV 84.5  PLT 298   Basic Metabolic Panel:  Recent Labs Lab 12/03/15 1419  NA 147*  K 4.4  CL 113*  CO2 19*  GLUCOSE 359*  BUN 32*  CREATININE 1.40*  CALCIUM 9.4   GFR: Estimated Creatinine Clearance: 38.6 mL/min (by C-G formula based on SCr of 1.4 mg/dL (H)). Liver Function Tests:  Recent Labs Lab 12/03/15 1419  AST 48*  ALT 21  ALKPHOS 78  BILITOT 0.6  PROT 7.7  ALBUMIN 2.4*   No results for input(s): LIPASE, AMYLASE in the last 168 hours. No results for input(s): AMMONIA in the last 168 hours. Coagulation Profile: No results for input(s): INR, PROTIME in the last 168 hours. Cardiac Enzymes: No results for input(s): CKTOTAL, CKMB, CKMBINDEX, TROPONINI in the last 168 hours. BNP (last 3 results) No results for input(s): PROBNP in the last 8760 hours. HbA1C: No results for input(s): HGBA1C in the last 72 hours. CBG: No results for input(s): GLUCAP in the last 168 hours. Lipid Profile: No results for input(s): CHOL, HDL, LDLCALC, TRIG, CHOLHDL,  LDLDIRECT in the last 72 hours. Thyroid Function Tests: No results for input(s): TSH, T4TOTAL, FREET4, T3FREE, THYROIDAB in the last 72 hours. Anemia Panel: No results for input(s): VITAMINB12, FOLATE, FERRITIN, TIBC, IRON, RETICCTPCT in the last 72 hours. Urine analysis:    Component Value Date/Time   COLORURINE AMBER (A) 12/03/2015 1524   APPEARANCEUR CLOUDY (A) 12/03/2015 1524   LABSPEC 1.039 (H) 12/03/2015 1524   PHURINE 5.0 12/03/2015 1524   GLUCOSEU 500 (A) 12/03/2015 1524   HGBUR MODERATE (A) 12/03/2015 1524   BILIRUBINUR MODERATE (A) 12/03/2015 1524   KETONESUR 15 (A) 12/03/2015 1524   PROTEINUR >300 (A) 12/03/2015 1524   UROBILINOGEN 1.0 07/09/2012 2227   NITRITE NEGATIVE 12/03/2015 1524   LEUKOCYTESUR NEGATIVE 12/03/2015 1524   Radiological Exams on Admission: Dg Chest Portable 1 View  Result Date: 12/03/2015 CLINICAL DATA:  Dementia, history of previous MI, diabetes, CABG. EXAM: PORTABLE CHEST 1 VIEW COMPARISON:  PA and lateral chest x-ray of October 26, 2015 FINDINGS: The lungs are mildly hypoinflated. The left hemidiaphragm is chronically elevated. There are increased lung markings at both bases which are not entirely new. The ICD generator obscures a portion of the left hemidiaphragm. The heart is not enlarged. The pulmonary vascularity is normal. There is calcification in the wall of the aortic arch. There are post CABG changes. The bony thorax exhibits no acute abnormality. IMPRESSION: No acute cardiopulmonary abnormality allowing for mild hypo inflation. Aortic atherosclerosis. Electronically Signed   By: David  [place] M.D.   On: 12/03/2015 14:59    EKG: Independently reviewed. Significant LVH with repolarization abnormality in anterior leads. Largely unchanged from previous. Discussed with Dr. Herbie Baltimore.  Assessment/Plan Principal Problem:   SIRS (systemic inflammatory response syndrome) (HCC) Active Problems:   CAD (coronary artery disease)   Hypertension    Cardiomyopathy, ischemic   ICD (implantable cardioverter-defibrillator) in place   Dementia with behavioral disturbance   SIRS unknown source. Patient with vaginal discharge and urine odor. Urinalysis significant for WBCs, bacteria, bili and hemoglobin without RBCs. Patient with fever, otherwise, appears biggest issue is patient is dehydrated. Wet prep significant for WBCs. -IVF -watch fluid status carefully as patient has a very weak heart -continue vanc/zosyn as I am unsure where the source is. UA is suspicious -f/u blood/urine cultures  Altered mental status Unsure of patient's baseline but I would assume patient is usually more alert. -CT head -ammonia, TSH, UDS, tylenol level -may need to call neurology for evaluation if no improvement with fluids and antibiotics -may consider  Dehydration Apparent from patient's exam and labs. S/p 2.5L in ED. Likely contributing to hypernatremia and hypochloridemia -D5 1/2NS -CK since patient found down with associated hemoglobinuria  Acute kidney injury Likely secondary to dehydration and subsequent prerenal injury. Patient found down with hemoglobinuria. Possible rhabdomyolysis -fluids as above -CK as above  Chronic systolic heart failure s/p AICD appears compensated and overall, patient actually looks dry. Last EF of 30-35% with severe hypokinesis to akinesis -daily weights -in/outs -watch fluid status since receiving fluids  Hypertension -Hold home meds  Diabetes mellitus -hold home meds -q4 CBGs  Dementia I think this, in addition to other medical issues are very significant in regard to patient's presentation. Patient's number one intervention this hospitalization is likely goals of care as she has some pretty significant heart pathology. Palliative care consult placed in the ED already, however, they are backed up.  -follow-up palliative care recommendations   Sepsis - Repeat Assessment  Performed at:    Around  1500  Vitals     Blood pressure (!) 111/47, pulse 78, temperature 97.8 F (36.6 C), temperature source Oral, resp. rate (!) 28, height 5\' 3"  (1.6 m), weight 103 kg (227 lb), SpO2 93 %.  Heart:     Regular rate and rhythm  Lungs:    CTA  Capillary Refill:   <2 sec  Peripheral Pulse:   Dorsalis pedis pulse  palpable  Skin:     Normal Color    DVT prophylaxis: SCDs until results of CT head available Code Status: DNR Family Communication: None at bedside Disposition Plan: Stepdown unit Consults called: None Admission status: Inpatient   Jacquelin Hawking, MD [facility] Pager [phone]  If 7PM-7AM, please contact night-coverage [email or web address] Password [email or web address]  12/03/2015, 4:55 PM

## 2015-12-03 NOTE — ED Notes (Signed)
Returned from CT.

## 2015-12-03 NOTE — ED Provider Notes (Signed)
[facility]-EMER[facility]ENCY DEPT Provider Note   CSN: [id number] Arrival date & time: 12/03/15  1341   History   Chief Complaint Chief Complaint  Patient presents with  . Fever   HPI   Level V Caveat due to dementia and AMS  Ashlee [name] is an 75 y.o. female with history of dementia, CAD, HTN, HLD, DM who presents to the ED for evaluation of fever and altered mental status. She initially arrives to the ED unaccompanied and history is limited. Per EMS, pt's family called because she was found laying naked in her feces and urine today.   After initial evaluation and sepsis order set orders placed, pt's husband Ashlee [name]) now in the room and provides more history. For the past couple months pt's health has taken a dramatic turn for the worse. She was most recently discharged on 11/23/15 after an admission for acute on chronic renal failure. She was discharged to home. Pt's husband states that since then her mental status has waxed and wanes. She is typically more conversant and oriented during the daytime, but will become more confused and silent at night. Yesterday pt reportedly slept all day. Today pt's husband states she was extremely lethargic and felt hot to the touch. She has had poor appetite. She has not been noted to be coughing or vomiting. Pt's husband states they do not have home health but he and family have been trying to keep her briefs changed frequently. He states that he also would like to talk to social work as he feels his wife is in need of hospice/palliative care and after this admission cannot be discharge home.   Past Medical History:  Diagnosis Date  . Arthritis    "in my knees" (07/06/2012)  . CAD (coronary artery disease) 1998   stent post heart attack  . DM type 2 (diabetes mellitus, type 2) (HCC)   . HOH (hard of hearing)   . Hyperlipidemia   . Hypertension   . Iron deficiency anemia   . Memory loss   . Migraines    "ages 89 thru 58; associated w/menstral cycle"  (07/06/2012)  . Moderate dementia without behavioral disturbance 10/30/2015  . Myocardial infarction 1998  . PONV (postoperative nausea and vomiting)   . Shortness of breath    "occasionally; could happen at any time" (07/06/2012)    Patient Active Problem List   Diagnosis Date Noted  . AKI (acute kidney injury) (HCC) 11/20/2015  . Acute renal failure (ARF) (HCC) 11/20/2015  . Moderate dementia without behavioral disturbance 10/30/2015  . Dementia with behavioral disturbance   . Advance care planning   . Goals of care, counseling/discussion   . Palliative care by specialist   . Do not resuscitate discussion   . Peripheral edema   . Dyspnea   . Palliative care encounter   . Edema 10/26/2015  . Acute on chronic systolic CHF (congestive heart failure) (HCC) 10/26/2015  . Chronic confusion 10/26/2015  . Elevated lactic acid level 10/26/2015  . 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 Ischemic cardiomyopathy 09/28/2013  . Insulin dependent diabetes mellitus (HCC) 08/10/2012  . Cardiomyopathy, ischemic 06/30/2012  . Chest pain 06/24/2010  . CAD (coronary artery disease) 06/24/2010  . Hypertension 06/24/2010  . Hyperlipidemia 06/24/2010  . Murmur 06/24/2010  . Bruit 06/24/2010    Past Surgical History:  Procedure Laterality Date  . BREAST BIOPSY Right    "thought they saw something real tiny; it was a stitch left by OR when breast reduced" (  07/06/2012)  . CARDIAC CATHETERIZATION  07/06/2012  . CORONARY ANGIOPLASTY WITH STENT PLACEMENT  1998   LAD/notes 07/07/2012  . CORONARY ARTERY BYPASS GRAFT N/A 07/11/2012   Procedure: CORONARY ARTERY BYPASS GRAFTING (CABG);  Surgeon: Loreli Slot, MD;  Location: [facility] OR;  Service: Open Heart Surgery;  Laterality: N/A;  x4, using left internal mammary and right greater saphenous vein.   . IMPLANTABLE CARDIOVERTER DEFIBRILLATOR IMPLANT N/A 09/28/2013   Procedure: IMPLANTABLE CARDIOVERTER DEFIBRILLATOR IMPLANT;   Surgeon: Duke Salvia, MD;  Location: [facility] CATH LAB;  Service: Cardiovascular;  Laterality: N/A;  . REDUCTION MAMMAPLASTY    . SHOULDER ARTHROSCOPY Right 07/25/2007   exam under anesthesia; arthroscopic biceps tenotomy; partial labral tear debridement; spur excision/notes 07/25/2007 (07/06/2012)  . SHOULDER HEMI-ARTHROPLASTY Right ?2009   "fell and crushed it" (07/06/2012)  . TONSILLECTOMY  1952  . TUBAL LIGATION      OB History    No data available       Home Medications    Prior to Admission medications   Medication Sig Start Date End Date Taking? Authorizing Provider  aspirin 81 MG tablet Take 81 mg by mouth daily.    Historical Provider, MD  BEPREVE 1.5 % SOLN Place 1 drop into both eyes daily as needed (EYES).  11/01/13   Historical Provider, MD  carvedilol (COREG) 6.25 MG tablet Take 6.25 mg by mouth 2 (two) times daily with a meal.    Historical Provider, MD  diclofenac sodium (VOLTAREN) 1 % GEL Apply 2 g topically 4 (four) times daily. 11/22/13   Carrington Clamp, DPM  ergocalciferol (VITAMIN D2) 50000 UNITS capsule Take 50,000 Units by mouth once a week.  11/20/10   Historical Provider, MD  exenatide (BYETTA 10 MCG PEN) 10 MCG/0.04ML SOLN Inject 10 mcg into the skin 2 (two) times daily with a meal. bid    Historical Provider, MD  Ferrous Sulfate (IRON) 325 (65 FE) MG TABS Take 1 tablet by mouth daily.    Historical Provider, MD  glimepiride (AMARYL) 2 MG tablet Take 2 mg by mouth daily.    Historical Provider, MD  glucose blood (SMARTEST TEST) test strip 1 each by Other route 2 (two) times daily. Use as instructed    Historical Provider, MD  insulin NPH (HUMULIN N,NOVOLIN N) 100 UNIT/ML injection Inject 5 Units into the skin at bedtime.     Historical Provider, MD  LORazepam (ATIVAN) 0.5 MG tablet Take 1 tablet (0.5 mg total) by mouth every 8 (eight) hours as needed for anxiety. 10/30/15   Rodolph Bong, MD  metFORMIN (GLUCOPHAGE) 500 MG tablet Take 500 mg by mouth 2 (two) times  daily with a meal.    Historical Provider, MD  Multiple Vitamins-Minerals (CENTRUM SILVER ADULT 50+ PO) Take 1 tablet by mouth daily.    Historical Provider, MD  rosuvastatin (CRESTOR) 40 MG tablet Take 1 tablet (40 mg total) by mouth daily. 12/24/14   Lewayne Bunting, MD  spironolactone (ALDACTONE) 25 MG tablet Take 0.5 tablets (12.5 mg total) by mouth daily. 10/30/15   Rodolph Bong, MD  traMADol (ULTRAM) 50 MG tablet Take 0.5 tablets (25 mg total) by mouth every 6 (six) hours as needed for moderate pain. 10/30/15   Rodolph Bong, MD    Family History Family History  Problem Relation Age of Onset  . Prostate cancer Father   . Heart attack Mother     MI at age 75  . Hypertension Mother   . Breast  cancer Sister   . Throat cancer Brother   . Lung cancer Brother     Social History Social History  Substance Use Topics  . Smoking status: Never Smoker  . Smokeless tobacco: Never Used  . Alcohol use Yes     Comment: 07/06/2012 "glass of wine 1-2X/year"     Allergies   Demerol and Percodan [oxycodone-aspirin]   Review of Systems Review of Systems 10 Systems reviewed and are negative for acute change except as noted in the HPI.   Physical Exam Updated Vital Signs BP 123/79 (BP Location: Left Arm)   Pulse 104   Temp 101.1 F (38.4 C) (Rectal)   Resp 12   Wt 92.1 kg   SpO2 97%   BMI 34.84 kg/m   Physical Exam  Constitutional:  Elderly, chronically ill appearing Smells of urine and feces  HENT:  Right Ear: External ear normal.  Left Ear: External ear normal.  Nose: Nose normal.  Mouth/Throat: No oropharyngeal exudate.  MM dry  Eyes: Pupils are equal, round, and reactive to light.  Neck: Neck supple.  Cardiovascular: Regular rhythm, normal heart sounds and intact distal pulses.   Mildly tachycardic, low 100s  Pulmonary/Chest:  Intermittently tachypneic. Shallow breathing. Diminished breath sounds throughout  Abdominal: Soft. Bowel sounds are normal. She  exhibits no distension. There is no tenderness. There is no rebound and no guarding.  Musculoskeletal: She exhibits no edema.  No LE edema  Lymphadenopathy:    She has no cervical adenopathy.  Neurological: No cranial nerve deficit.  Will open eyes to command Will otherwise not respond to commands Non verbal  Skin: Skin is warm and dry.  Nursing note and vitals reviewed.  Vitals:   12/03/15 1348 12/03/15 1356 12/03/15 1415 12/03/15 1423  BP:  123/79  127/77  Pulse:  106 104 105  Resp:  15 12 (!) 36  Temp:  101.1 F (38.4 C)    TempSrc:  Rectal    SpO2: 100% 99% 97% 97%  Weight:    103 kg  Height:    5\' 3"  (1.6 m)     ED Treatments / Results  Labs (all labs ordered are listed, but only abnormal results are displayed) Labs Reviewed  COMPREHENSIVE METABOLIC PANEL - Abnormal; Notable for the following:       Result Value   Sodium 147 (*)    Chloride 113 (*)    CO2 19 (*)    Glucose, Bld 359 (*)    BUN 32 (*)    Creatinine, Ser 1.40 (*)    Albumin 2.4 (*)    AST 48 (*)    GFR calc non Af Amer 35 (*)    GFR calc Af Amer 41 (*)    All other components within normal limits  CBC WITH DIFFERENTIAL/PLATELET - Abnormal; Notable for the following:    WBC 12.0 (*)    RBC 5.50 (*)    HCT 46.5 (*)    All other components within normal limits  I-STAT CG4 LACTIC ACID, ED - Abnormal; Notable for the following:    Lactic Acid, Venous 2.60 (*)    All other components within normal limits  CULTURE, BLOOD (ROUTINE X 2)  CULTURE, BLOOD (ROUTINE X 2)  URINE CULTURE  URINALYSIS, ROUTINE W REFLEX MICROSCOPIC (NOT AT [facility])    EKG  EKG Interpretation  Date/Time:  Tuesday December 03 2015 14:04:33 EDT Ventricular Rate:  106 PR Interval:  164 QRS Duration: 100 QT Interval:  320 QTC Calculation: 425  R Axis:   30 Text Interpretation:  Sinus tachycardia Left ventricular hypertrophy with repolarization abnormality Abnormal ECG since last tracing no significant change Confirmed by BELFI   MD, MELANIE ([id number]) on 12/03/2015 2:29:37 PM       Radiology Dg Chest Portable 1 View  Result Date: 12/03/2015 CLINICAL DATA:  Dementia, history of previous MI, diabetes, CABG. EXAM: PORTABLE CHEST 1 VIEW COMPARISON:  PA and lateral chest x-ray of October 26, 2015 FINDINGS: The lungs are mildly hypoinflated. The left hemidiaphragm is chronically elevated. There are increased lung markings at both bases which are not entirely new. The ICD generator obscures a portion of the left hemidiaphragm. The heart is not enlarged. The pulmonary vascularity is normal. There is calcification in the wall of the aortic arch. There are post CABG changes. The bony thorax exhibits no acute abnormality. IMPRESSION: No acute cardiopulmonary abnormality allowing for mild hypo inflation. Aortic atherosclerosis. Electronically Signed   By: David  [place] M.D.   On: 12/03/2015 14:59    Procedures Procedures (including critical care time)  Medications Ordered in ED Medications  vancomycin (VANCOCIN) 2,000 mg in sodium chloride 0.9 % 500 mL IVPB (2,000 mg Intravenous New Bag/Given 12/03/15 1441)  sodium chloride 0.9 % bolus 1,000 mL (1,000 mLs Intravenous New Bag/Given 12/03/15 1421)  acetaminophen (TYLENOL) suppository 650 mg (650 mg Rectal Given 12/03/15 1436)  sodium chloride 0.9 % bolus 1,500 mL (1,500 mLs Intravenous New Bag/Given 12/03/15 1436)  piperacillin-tazobactam (ZOSYN) IVPB 3.375 g (3.375 g Intravenous New Bag/Given 12/03/15 1437)     Initial Impression / Assessment and Plan / ED Course  I have reviewed the triage vital signs and the nursing notes.  Pertinent labs & imaging results that were available during my care of the patient were reviewed by me and considered in my medical decision making (see chart for details).  Clinical Course   Code Sepsis called, sepsis order set used. 30cc/kg NS bolus running. Vanc/Zosyn per pharmacy consult. Rectal tylenol ordered. Possible respiratory source given  new oxygen requirement. Pt is de-satting to 87-88% on room air on arrival and does not typically require oxygen at baseline. She improves to 97-98% with good waveform with 2L O2 via nasal cannula. Blood work, CXR, and urinalysis ordered and pending. qSOFA 2.  3:21 PM RN called me to the room as pt had purulent discharge on GU exam. I assisted staff in cleaning pt and performing in and out cath. Pt with copious malodorous yellow-green milky discharge from vaginal canal. I have added a CT pelvis with contrast to assess for possible fistula/abscess or other abnormality. White count of 12,000 lactic acid 2.6. Some AKI. Urinalysis is pending. Either way pt will need medical admission. I have consulted the hospitalist service. Most recent BP in the system now 90/53 but I have been in the room multiple times and pt's BP steady at low 100s-110s systolic.  4:03 PM I spoke with Dr. Caleb Popp of the hospitalist service who will admit pt to tele inpatient. Appreciate assistance.  Final Clinical Impressions(s) / ED Diagnoses   1. Sepsis 2. Respiratory failure 3. AKI 4. Vaginal discharge    New Prescriptions New Prescriptions   No medications on file     Carlene Coria, [place] 12/03/15 1606    Rolan Bucco, MD 12/03/15 [phone]

## 2015-12-03 NOTE — Progress Notes (Signed)
Palliative Medicine consult noted. Due to high referral volume, there may be a delay seeing this patient. Please call the Palliative Medicine Team office at [phone] if recommendations are needed in the interim.  Thank you for inviting [place] to see this patient.  Margret Chance Vali Capano, RN, BSN, [facility] 12/03/2015 3:49 PM Cell [phone] 8:00-4:00 Monday-Friday Office [phone]

## 2015-12-03 NOTE — ED Triage Notes (Signed)
Paramedic reports that they were called for an evaluation and when they arrived they found pt. Is a prone position, naked , covered in urine and feces.  Pt. Lives with family.  Upon assessment,  Family reports that they are in the process of starting hospice and pt. Is failure to thrive.  Pt.  Has dementia and only moans. Alert but not oriented .

## 2015-12-04 DIAGNOSIS — A419 Sepsis, unspecified organism: Secondary | ICD-10-CM

## 2015-12-04 DIAGNOSIS — I2589 Other forms of chronic ischemic heart disease: Secondary | ICD-10-CM

## 2015-12-04 DIAGNOSIS — L8990 Pressure ulcer of unspecified site, unspecified stage: Secondary | ICD-10-CM | POA: Insufficient documentation

## 2015-12-04 DIAGNOSIS — Z95810 Presence of automatic (implantable) cardiac defibrillator: Secondary | ICD-10-CM

## 2015-12-04 DIAGNOSIS — Z515 Encounter for palliative care: Secondary | ICD-10-CM

## 2015-12-04 DIAGNOSIS — F0390 Unspecified dementia without behavioral disturbance: Secondary | ICD-10-CM

## 2015-12-04 DIAGNOSIS — E1110 Type 2 diabetes mellitus with ketoacidosis without coma: Secondary | ICD-10-CM

## 2015-12-04 DIAGNOSIS — M6282 Rhabdomyolysis: Secondary | ICD-10-CM

## 2015-12-04 DIAGNOSIS — I255 Ischemic cardiomyopathy: Secondary | ICD-10-CM

## 2015-12-04 DIAGNOSIS — G9340 Encephalopathy, unspecified: Secondary | ICD-10-CM

## 2015-12-04 DIAGNOSIS — N179 Acute kidney failure, unspecified: Secondary | ICD-10-CM

## 2015-12-04 DIAGNOSIS — Z7189 Other specified counseling: Secondary | ICD-10-CM

## 2015-12-04 DIAGNOSIS — E1311 Other specified diabetes mellitus with ketoacidosis with coma: Secondary | ICD-10-CM

## 2015-12-04 DIAGNOSIS — Z794 Long term (current) use of insulin: Secondary | ICD-10-CM

## 2015-12-04 LAB — BLOOD CULTURE ID PANEL (REFLEXED)
Acinetobacter baumannii: NOT DETECTED
CANDIDA ALBICANS: NOT DETECTED
CANDIDA GLABRATA: NOT DETECTED
CANDIDA KRUSEI: NOT DETECTED
CANDIDA PARAPSILOSIS: NOT DETECTED
CANDIDA TROPICALIS: NOT DETECTED
ENTEROBACTER CLOACAE COMPLEX: NOT DETECTED
ENTEROCOCCUS SPECIES: NOT DETECTED
Enterobacteriaceae species: NOT DETECTED
Escherichia coli: NOT DETECTED
HAEMOPHILUS INFLUENZAE: NOT DETECTED
Klebsiella oxytoca: NOT DETECTED
Klebsiella pneumoniae: NOT DETECTED
Listeria monocytogenes: NOT DETECTED
Methicillin resistance: DETECTED — AB
NEISSERIA MENINGITIDIS: NOT DETECTED
PROTEUS SPECIES: NOT DETECTED
Pseudomonas aeruginosa: NOT DETECTED
STAPHYLOCOCCUS SPECIES: DETECTED — AB
STREPTOCOCCUS PYOGENES: NOT DETECTED
Serratia marcescens: NOT DETECTED
Staphylococcus aureus (BCID): NOT DETECTED
Streptococcus agalactiae: NOT DETECTED
Streptococcus pneumoniae: NOT DETECTED
Streptococcus species: NOT DETECTED

## 2015-12-04 LAB — CBC
HCT: 35.8 % — ABNORMAL LOW (ref 36.0–46.0)
Hemoglobin: 11.1 g/dL — ABNORMAL LOW (ref 12.0–15.0)
MCH: 26.1 pg (ref 26.0–34.0)
MCHC: 31 g/dL (ref 30.0–36.0)
MCV: 84.2 fL (ref 78.0–100.0)
PLATELETS: 238 10*3/uL (ref 150–400)
RBC: 4.25 MIL/uL (ref 3.87–5.11)
RDW: 15.1 % (ref 11.5–15.5)
WBC: 12.9 10*3/uL — AB (ref 4.0–10.5)

## 2015-12-04 LAB — RAPID URINE DRUG SCREEN, HOSP PERFORMED
Amphetamines: NOT DETECTED
BENZODIAZEPINES: NOT DETECTED
Barbiturates: NOT DETECTED
Cocaine: NOT DETECTED
OPIATES: NOT DETECTED
TETRAHYDROCANNABINOL: NOT DETECTED

## 2015-12-04 LAB — COMPREHENSIVE METABOLIC PANEL
ALT: 21 U/L (ref 14–54)
AST: 47 U/L — AB (ref 15–41)
Albumin: 1.8 g/dL — ABNORMAL LOW (ref 3.5–5.0)
Alkaline Phosphatase: 59 U/L (ref 38–126)
Anion gap: 7 (ref 5–15)
BILIRUBIN TOTAL: 0.7 mg/dL (ref 0.3–1.2)
BUN: 24 mg/dL — AB (ref 6–20)
CALCIUM: 8.3 mg/dL — AB (ref 8.9–10.3)
CO2: 22 mmol/L (ref 22–32)
CREATININE: 0.88 mg/dL (ref 0.44–1.00)
Chloride: 118 mmol/L — ABNORMAL HIGH (ref 101–111)
GFR calc Af Amer: >60 mL/min (ref >60)
GFR calc non Af Amer: >60 mL/min (ref >60)
Glucose, Bld: 348 mg/dL — ABNORMAL HIGH (ref 65–99)
Potassium: 3.4 mmol/L — ABNORMAL LOW (ref 3.5–5.1)
Sodium: 147 mmol/L — ABNORMAL HIGH (ref 135–145)
TOTAL PROTEIN: 5.4 g/dL — AB (ref 6.5–8.1)

## 2015-12-04 LAB — GLUCOSE, CAPILLARY
GLUCOSE-CAPILLARY: 206 mg/dL — AB (ref 65–99)
GLUCOSE-CAPILLARY: 132 mg/dL — AB (ref 65–99)
GLUCOSE-CAPILLARY: 171 mg/dL — AB (ref 65–99)
Glucose-Capillary: 306 mg/dL — ABNORMAL HIGH (ref 65–99)

## 2015-12-04 MED ORDER — SODIUM CHLORIDE 0.9 % IV SOLN
INTRAVENOUS | Status: AC
  Administered 2015-12-04 – 2015-12-05 (×2): via INTRAVENOUS
  Filled 2015-12-04 (×3): qty 1000

## 2015-12-04 NOTE — Evaluation (Signed)
Clinical/Bedside Swallow Evaluation Patient Details  Name: Ashlee [name] MRN: [id number] Date of Birth: 11/29/1940  Today's Date: 12/04/2015 Time: SLP Start Time (ACUTE ONLY): 1140 SLP Stop Time (ACUTE ONLY): 1200 SLP Time Calculation (min) (ACUTE ONLY): 20 min  Past Medical History:  Past Medical History:  Diagnosis Date  . Arthritis    "in my knees" (07/06/2012)  . CAD (coronary artery disease) 1998   stent post heart attack  . DM type 2 (diabetes mellitus, type 2) (HCC)   . HOH (hard of hearing)   . Hyperlipidemia   . Hypertension   . Iron deficiency anemia   . Memory loss   . Migraines    "ages 41 thru 22; associated w/menstral cycle" (07/06/2012)  . Moderate dementia without behavioral disturbance 10/30/2015  . Myocardial infarction 1998  . PONV (postoperative nausea and vomiting)   . Shortness of breath    "occasionally; could happen at any time" (07/06/2012)   Past Surgical History:  Past Surgical History:  Procedure Laterality Date  . BREAST BIOPSY Right    "thought they saw something real tiny; it was a stitch left by OR when breast reduced" (07/06/2012)  . CARDIAC CATHETERIZATION  07/06/2012  . CORONARY ANGIOPLASTY WITH STENT PLACEMENT  1998   LAD/notes 07/07/2012  . CORONARY ARTERY BYPASS GRAFT N/A 07/11/2012   Procedure: CORONARY ARTERY BYPASS GRAFTING (CABG);  Surgeon: Loreli Slot, MD;  Location: [facility] OR;  Service: Open Heart Surgery;  Laterality: N/A;  x4, using left internal mammary and right greater saphenous vein.   . IMPLANTABLE CARDIOVERTER DEFIBRILLATOR IMPLANT N/A 09/28/2013   Procedure: IMPLANTABLE CARDIOVERTER DEFIBRILLATOR IMPLANT;  Surgeon: Duke Salvia, MD;  Location: [facility] CATH LAB;  Service: Cardiovascular;  Laterality: N/A;  . REDUCTION MAMMAPLASTY    . SHOULDER ARTHROSCOPY Right 07/25/2007   exam under anesthesia; arthroscopic biceps tenotomy; partial labral tear debridement; spur excision/notes 07/25/2007 (07/06/2012)  . SHOULDER HEMI-ARTHROPLASTY  Right ?2009   "fell and crushed it" (07/06/2012)  . TONSILLECTOMY  1952  . TUBAL LIGATION     HPI:  75 year old female admitted 12/03/15 due to AMS, progressive weakness, decreased responsiveness. PMH significant for advanced dementia, ischemic cardiomyopathy, MI, CABG, HTN, HLD, DM.  BSE ordered to determine safe po level   Assessment / Plan / Recommendation Clinical Impression  Pt sleeping when SLP entered room. Roused easily, but returned to sleep easily without stimulation. SLP set up suction to facilitate oral care. Pt resistant to oral care, closing lips around swab, requiring verbal and visual cues to open mouth. Pt was given trials of puree and thin liquid. No overt s/s aspiration with either consistency. Solid consistency not given at this time, due to pt inability to take more than 1/8 teaspoon and level of stimulation needed to maintain alertness. Pt currently receiving Dys 3 diet and thin liquids. Will modify to Dys 1 (puree) and thin liquids. SLP will follow for diet tolerance and appropriateness to advance solids.    Aspiration Risk  Mild aspiration risk    Diet Recommendation Dysphagia 1 (Puree);Thin liquid   Liquid Administration via: Cup;Straw Medication Administration: Crushed with puree Supervision: Staff to assist with self feeding;Full supervision/cueing for compensatory strategies Compensations: Minimize environmental distractions;Slow rate;Small sips/bites Postural Changes: Seated upright at 90 degrees;Remain upright for at least 30 minutes after po intake    Other  Recommendations Oral Care Recommendations: Oral care BID;Staff/trained caregiver to provide oral care Other Recommendations: Have oral suction available   Follow up Recommendations  (TBD)  Frequency and Duration min 1 x/week  2 weeks       Prognosis Prognosis for Safe Diet Advancement: Fair Barriers to Reach Goals: Cognitive deficits      Swallow Study   General Date of Onset:  12/03/15 HPI: 75 year old female admitted 12/03/15 due to AMS, progressive weakness, decreased responsiveness. PMH significant for advanced dementia, ischemic cardiomyopathy, MI, CABG, HTN, HLD, DM.  BSE ordered to determine safe po level Type of Study: Bedside Swallow Evaluation Previous Swallow Assessment: none found Diet Prior to this Study: Dysphagia 3 (soft);Thin liquids Temperature Spikes Noted: Yes (101 on admit) Respiratory Status: Room air History of Recent Intubation: No Behavior/Cognition: Confused;Doesn't follow directions Oral Cavity Assessment: Within Functional Limits Oral Care Completed by SLP: Yes Oral Cavity - Dentition: Adequate natural dentition Self-Feeding Abilities: Total assist Patient Positioning: Upright in bed Baseline Vocal Quality: Low vocal intensity Volitional Cough: Cognitively unable to elicit Volitional Swallow: Unable to elicit    Oral/Motor/Sensory Function Overall Oral Motor/Sensory Function: Within functional limits   Ice Chips Ice chips: Within functional limits Presentation: Spoon   Thin Liquid Thin Liquid: Within functional limits Presentation: Straw    Nectar Thick Nectar Thick Liquid: Not tested   Honey Thick Honey Thick Liquid: Not tested   Puree Puree: Within functional limits Presentation: Spoon   Solid    Solid: Not tested       Leigh AuroraBueche, Oday Ridings Brown 12/04/2015,12:13 PM  Harlow Asaelia B. Trew Sunde, MSP, CCC-SLP [phone]

## 2015-12-04 NOTE — Progress Notes (Signed)
Report called to RN on [address] [place], husband at bedside and aware of transfer, central tele notified of transfer, charge RN transferring patient to [address] room 10.  Receiving RN has number if there are any questions.  Hermina Barters, RN   [phone]

## 2015-12-04 NOTE — Consult Note (Signed)
Consultation Note Date: 12/04/2015   Patient Name: Ashlee [name]  DOB: [birth year]  MRN: [id number]  Age / Sex: 75 y.o., female  PCP: Lorne Skeens, MD Referring Physician: Orson Eva, MD  Reason for Consultation: Establishing goals of care and Psychosocial/spiritual support  HPI/Patient Profile: 75 y.o. female  with past medical history of CAD/CABG, HF (EF 30-35 with severe hypokinesis), dementia, and diabetes who was admitted on 12/03/2015 with sepsis, DKA and AMS.  Mrs. Conklin has had 3 admissions in the last 6 months.  Her most recent was on 9/27 with acute renal failure.  She was discharged to home with [facility].  Since admission yesterday she has been treated with broad-spectrum antibiotics. Her blood cultures appear to be positive and she has a urinary tract infection.  At bedside this morning she appears comfortable, but is severely demented and unable to form an intelligible sentence or follow commands.  Clinical Assessment and Goals of Care: I met at bedside with Moss Mc the patient's spouse. He tells me that even with the help of home health and neighbors he was unable to properly care for his wife at home. She is bedridden and it was very difficult to turn her, change her, and care for her. Every evening at approximately 6 PM her disposition would change and she would stop speaking. He is concerned that she is not going to get better. He is concerned that she will have repeated hospitalizations and continue to deteriorate. We discussed finances involved in caring for his wife. Skilled nursing facilities are $6-$8000 a month. He comments that they should both die together because he will have nothing left to survive on.  Mr. 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 She has a  strong religious faith and has given a lifetime of service. They have no children. Mr. Parsley lost his only sister in May 2015.   We discussed her current lab results including her UA and blood cultures. Then we reviewed a MOST form together. Mr. Matthies does not believe his wife would want to live in a demented and bedridden state. He wants to take a day or 2 to consider it but is leaning toward comfort measures only. We discussed hospice. He would like to learn more about what [place] would offer Mrs. Marzetta Board.  We will talk again tomorrow.    Primary Decision Maker:  HCPOA: Moss Mc    SUMMARY OF RECOMMENDATIONS    I will ask the hospital liaison for [facility] to contact Mr. Conrow and provide information about residential hospice.  Mr. Yogi and I will meet again tomorrow or Friday to complete the most form.  Code Status/Advance Care Planning:  DNR    Symptom Management:   Per primary team area patient appears comfortable.  Palliative Prophylaxis:   Aspiration, Delirium Protocol and Palliative Wound Care  Additional Recommendations (Limitations, Scope, Preferences):  Avoid Hospitalization and  Minimize Medications  Psycho-social/Spiritual:   Desire for further Chaplaincy support:yes  Additional Recommendations: Caregiving  Support/Resources  Prognosis:   Unable to determine: In the setting of poor by mouth intake with poor nutrition, advanced dementia (fast 7), advanced heart disease, bedridden state, recurrent infections she likely has less than 6 months to live. If the family were to choose not to treat the next infection she likely has much less, perhaps 2-4 weeks.  Discharge Planning: To Be Determined      Primary Diagnoses: Present on Admission: . SIRS (systemic inflammatory response syndrome) (HCC) . Cardiomyopathy, ischemic . CAD (coronary artery disease) . Dementia with behavioral disturbance . Hypertension . ICD (implantable  cardioverter-defibrillator) in place . Altered mental status   I have reviewed the medical record, interviewed the patient and family, and examined the patient. The following aspects are pertinent.  Past Medical History:  Diagnosis Date  . Arthritis    "in my knees" (07/06/2012)  . CAD (coronary artery disease) 1998   stent post heart attack  . DM type 2 (diabetes mellitus, type 2) ([place])   . HOH (hard of hearing)   . Hyperlipidemia   . Hypertension   . Iron deficiency anemia   . Memory loss   . Migraines    "ages 27 thru 57; associated w/menstral cycle" (07/06/2012)  . Moderate dementia without behavioral disturbance 10/30/2015  . Myocardial infarction 1998  . PONV (postoperative nausea and vomiting)   . Shortness of breath    "occasionally; could happen at any time" (07/06/2012)   Social History   Social History  . Marital status: Married    Spouse name: Gwenlyn Perking  . Number of children: 0  . Years of education: N/A   Occupational History  .      retired- [employer]   Social History Main Topics  . Smoking status: Never Smoker  . Smokeless tobacco: Never Used  . Alcohol use Yes     Comment: 07/06/2012 "glass of wine 1-2X/year"  . Drug use: No  . Sexual activity: Not Currently   Other Topics Concern  . None   Social History Narrative   Lives at home with husband   Caffeine use-   Family History  Problem Relation Age of Onset  . Prostate cancer Father   . Heart attack Mother     MI at age 88  . Hypertension Mother   . Breast cancer Sister   . Throat cancer Brother   . Lung cancer Brother    Scheduled Meds: . chlorhexidine  15 mL Mouth Rinse BID  . insulin aspart  0-9 Units Subcutaneous Q4H  . mouth rinse  15 mL Mouth Rinse q12n4p  . piperacillin-tazobactam (ZOSYN)  IV  3.375 g Intravenous Q8H  . sodium chloride flush  3 mL Intravenous Q12H  . vancomycin  1,250 mg Intravenous Q24H   Continuous Infusions: . sodium chloride 0.9 % 1,000 mL with potassium  chloride 20 mEq infusion     PRN Meds:.acetaminophen **OR** acetaminophen Allergies  Allergen Reactions  . Demerol Nausea And Vomiting    Perfuse vomitting  . Percodan [Oxycodone-Aspirin] Nausea And Vomiting    Perfuse vomitting   Review of Systems: Patient demented and unable to give review of systems  Physical Exam  Pleasant-appearing African-American female. Tries to speak but unable to form words or sentences CV regular rate and rhythm Respirations no increased work of breathing Abdomen soft, nontender, positive bowel sounds Extremities: Able to move all 4, no edema Neurological: Awake,  alert, unable to follow commands, unable to speak intelligible sentences.  Vital Signs: BP (!) 105/52 (BP Location: Left Arm)   Pulse 74   Temp 97.4 F (36.3 C) (Oral)   Resp 14   Ht 5' 3"  (1.6 m)   Wt 86 kg (189 lb 9.5 oz)   SpO2 99%   BMI 33.59 kg/m  Pain Assessment: PAINAD       SpO2: SpO2: 99 % O2 Device:SpO2: 99 % O2 Flow Rate: .O2 Flow Rate (L/min): 2 L/min  IO: Intake/output summary:  Intake/Output Summary (Last 24 hours) at 12/04/15 0935 Last data filed at 12/04/15 0600  Gross per 24 hour  Intake              975 ml  Output                0 ml  Net              975 ml    LBM: Last BM Date: 12/03/15 Baseline Weight: Weight: 92.1 kg (203 lb) Most recent weight: Weight: 86 kg (189 lb 9.5 oz)     Palliative Assessment/Data:   Flowsheet Rows   Flowsheet Row Most Recent Value  Intake Tab  Referral Department  Hospitalist  Unit at Time of Referral  Intermediate Care Unit  Palliative Care Primary Diagnosis  Sepsis/Infectious Disease  Palliative Care Type  New Palliative care  Reason for referral  Clarify Goals of Care  Clinical Assessment  Palliative Performance Scale Score  30%  Psychosocial & Spiritual Assessment  Palliative Care Outcomes  Patient/Family meeting held?  Yes  Who was at the meeting?  patient and husband  Palliative Care Outcomes  Clarified goals  of care, Counseled regarding hospice      Time In: 9:30 Time Out: 10:40 Time Total: 70 min Greater than 50%  of this time was spent counseling and coordinating care related to the above assessment and plan.  Signed by: Imogene Burn, PA-C Palliative Medicine Pager: [phone]  Please contact Palliative Medicine Team phone at [phone] for questions and concerns.  For individual provider: See Shea Evans

## 2015-12-04 NOTE — Consult Note (Signed)
[facility] Human resources officer  Received request from [facility] and PMT PA Algis Downs for family interest in [facility]. Confirmed with CSW Erie Noe prior to attempting contact with family. Attempted visit in room, spouse not present and patient very pleasantly confused. Attempted phone contact at both numbers in chart but no answer. Left message requesting call back and will follow up again tomorrow if spouse does not call back today. CSW Erie Noe is aware [employer] does not have availability today.   Thank you,  Forrestine Him, LCSW [phone]

## 2015-12-04 NOTE — Progress Notes (Signed)
PROGRESS NOTE  Ashlee [name] ZOX:[id number] DOB: 05-29-1940 DOA: 12/03/2015 PCP: Junious Silk, MD  Brief History:  75 y/o female with history of ischemic cardiomyopathy (EF 30-35%), DM2, HTN, CAD/CABG, HLD presented with altered mental status change. Due to the patient's dementia and acute encephalopathy, she was unable to provide any history. According to the husband, the patient has had waxing and waning episodes of mental status. On the day of admission, the patient had increased lethargy and was felt hot to touch. The patient has had decreased oral intake for several days. According to the patient's husband, the patient has had declining functional status for the past few months. The patient was recently discharged from the hospital on 11/22/2015 after treatment for acute renal failure secondary to dehydration. Upon presentation, the patient was noted to be hypernatremic with a sodium 147, and she was found to be in acute kidney injury with serum creatinine 1.40 and WBC 12.0. Code sepsis was activated, the patient was given the appropriate fluid boluses and started on intravenous antibiotics after cultures were obtained.  Assessment/Plan: Sepsis -Present at the time of admission with elevated lactic acid, fever, and leukocytosis -Likely due to urinary source -Continue antibiotics pending culture data -Continue IV fluids -Presenting lactic acid 2.60  Acute encephalopathy -Multifactorial including acute kidney injury, dehydration, rhabdomyolysis, DKA, and possible UTI -Mental status gradually improving with fluid resuscitation and antibiotics -CT brain negative -Ammonia 18  Pyuria -Continue antibiotics pending culture data  DKA type 2 -Patient presented with serum glucose 359 with metabolic acidosis and anion gap 15 with ketonuria -Resolved -Continue [place] insulin -10/27/2015 hemoglobin A1c 9.9  Rhabdomyolysis -CPK 2306 at the time of presentation -Continue IV  fluids  Acute kidney injury -Secondary to volume depletion in the setting of rhabdomyolysis -Improving with fluid resuscitation  Sacral Decubitus ulcer -present at time of presentation -wound care consultation  Diabetes mellitus type 2, uncontrolled -10/27/2015 hemoglobin A1c 9.9  Chronic systolic CHF -Status post AICD -Appears clinically euvolemic -10/27/2015 echo EF 30-35%, grade 1 DD, mild-mod AS -daily weights  Dementia -husband requested palliative care consult for GOC -Palliative has been consulted    Disposition Plan:  SNF in  2-3 days  Family Communication:  No Family at bedside--attempted to call husband and nephew--left voice mails--Total time spent 35 minutes.  Greater than 50% spent face to face counseling and coordinating care.   Consultants:  none  Code Status:  DNR  DVT Prophylaxis:  SCDs   Procedures: As Listed in Progress Note Above  Antibiotics: vanco 10/10>>> Zosyn 10/10>>>    Subjective: Patient is awake and alert, pleasantly confused. Denies any pain or shortness of breath. Remainder review of systems unobtainable secondary to encephalopathy.   Objective: Vitals:   12/04/15 0300 12/04/15 0337 12/04/15 0400 12/04/15 0749  BP: (!) 115/49 (!) 115/49 (!) 117/59 (!) 105/52  Pulse: 70 68 73 74  Resp: (!) 28 17 18 14   Temp:  97.6 F (36.4 C)  97.4 F (36.3 C)  TempSrc:  Axillary  Oral  SpO2: 94% 97% 95% 99%  Weight:  86 kg (189 lb 9.5 oz)    Height:        Intake/Output Summary (Last 24 hours) at 12/04/15 0805 Last data filed at 12/04/15 0600  Gross per 24 hour  Intake              975 ml  Output  0 ml  Net              975 ml   Weight change:  Exam:   General:  Pt is alert, follows commands appropriately, not in acute distress  HEENT: No icterus, No thrush, No neck mass, /AT  Cardiovascular: RRR, S1/S2, no rubs, no gallops  Respiratory: CTA bilaterally, no wheezing, no crackles, no rhonchi  Abdomen:  Soft/+BS, non tender, non distended, no guarding  Extremities: No edema, No lymphangitis, No petechiae, No rashes, no synovitis   Data Reviewed: I have personally reviewed following labs and imaging studies Basic Metabolic Panel:  Recent Labs Lab 12/03/15 1419 12/04/15 0140  NA 147* 147*  K 4.4 3.4*  CL 113* 118*  CO2 19* 22  GLUCOSE 359* 348*  BUN 32* 24*  CREATININE 1.40* 0.88  CALCIUM 9.4 8.3*   Liver Function Tests:  Recent Labs Lab 12/03/15 1419 12/04/15 0140  AST 48* 47*  ALT 21 21  ALKPHOS 78 59  BILITOT 0.6 0.7  PROT 7.7 5.4*  ALBUMIN 2.4* 1.8*   No results for input(s): LIPASE, AMYLASE in the last 168 hours.  Recent Labs Lab 12/03/15 2132  AMMONIA 18   Coagulation Profile: No results for input(s): INR, PROTIME in the last 168 hours. CBC:  Recent Labs Lab 12/03/15 1419 12/04/15 0140  WBC 12.0* 12.9*  NEUTROABS 9.7*  --   HGB 14.9 11.1*  HCT 46.5* 35.8*  MCV 84.5 84.2  PLT 298 238   Cardiac Enzymes:  Recent Labs Lab 12/03/15 2015 12/03/15 2132  CKTOTAL  --  2,306*  TROPONINI 0.16*  --    BNP: Invalid input(s): POCBNP CBG:  Recent Labs Lab 12/03/15 2118 12/03/15 2356 12/04/15 0359 12/04/15 0747  GLUCAP 297* 367* 306* 206*   HbA1C: No results for input(s): HGBA1C in the last 72 hours. Urine analysis:    Component Value Date/Time   COLORURINE AMBER (A) 12/03/2015 1524   APPEARANCEUR CLOUDY (A) 12/03/2015 1524   LABSPEC 1.039 (H) 12/03/2015 1524   PHURINE 5.0 12/03/2015 1524   GLUCOSEU 500 (A) 12/03/2015 1524   HGBUR MODERATE (A) 12/03/2015 1524   BILIRUBINUR MODERATE (A) 12/03/2015 1524   KETONESUR 15 (A) 12/03/2015 1524   PROTEINUR >300 (A) 12/03/2015 1524   UROBILINOGEN 1.0 07/09/2012 2227   NITRITE NEGATIVE 12/03/2015 1524   LEUKOCYTESUR NEGATIVE 12/03/2015 1524   Sepsis Labs: @LABRCNTIP (procalcitonin:4,lacticidven:4) ) Recent Results (from the past 240 hour(s))  Wet prep, genital     Status: Abnormal    Collection Time: 12/03/15  5:14 PM  Result Value Ref Range Status   Yeast Wet Prep HPF POC NONE SEEN NONE SEEN Final   Trich, Wet Prep NONE SEEN NONE SEEN Final   Clue Cells Wet Prep HPF POC NONE SEEN NONE SEEN Final   WBC, Wet Prep HPF POC MANY (A) NONE SEEN Final   Sperm NONE SEEN  Final  MRSA PCR Screening     Status: None   Collection Time: 12/03/15  8:30 PM  Result Value Ref Range Status   MRSA by PCR NEGATIVE NEGATIVE Final    Comment:        The GeneXpert MRSA Assay (FDA approved for NASAL specimens only), is one component of a comprehensive MRSA colonization surveillance program. It is not intended to diagnose MRSA infection nor to guide or monitor treatment for MRSA infections.      Scheduled Meds: . chlorhexidine  15 mL Mouth Rinse BID  . insulin aspart  0-9 Units Subcutaneous Q4H  .  mouth rinse  15 mL Mouth Rinse q12n4p  . piperacillin-tazobactam (ZOSYN)  IV  3.375 g Intravenous Q8H  . sodium chloride flush  3 mL Intravenous Q12H  . vancomycin  1,250 mg Intravenous Q24H   Continuous Infusions: . dextrose 5 % and 0.45% NaCl 100 mL/hr at 12/04/15 0000    Procedures/Studies: Dg Tibia/fibula Right  Result Date: 11/20/2015 CLINICAL DATA:  Female who presents to the emergency department with her husband and cousin who report the patient has not been able to walk for the past 5 days. She's been complaining of some right leg pain and they have noticed some right lower extremity edema. The patient has a history of dementia and is usually demented. They report she's been mentating at her baseline. She was admitted to the hospital earlier this month for some confusion, UTI and peripheral edema. In the hospital they ruled out a DVT as well as a fracture to her hip using x-ray and CT. EXAM: RIGHT TIBIA AND FIBULA - 2 VIEW COMPARISON:  None. FINDINGS: No fracture.  No bone lesion. Knee joint is normally aligned. There is marginal spurring consistent with osteoarthritis. Ankle  joint is normally spaced and aligned. There is mild subcutaneous soft tissue edema diffusely. Surgical vascular clips are noted along the medial aspect of the knee. IMPRESSION: No fracture or bone lesion. No dislocation. Knee joint osteoarthritis. Electronically Signed   By: Amie Portland M.D.   On: 11/20/2015 19:27   Ct Head Wo Contrast  Result Date: 12/03/2015 CLINICAL DATA:  Weakness.  Altered mental status. EXAM: CT HEAD WITHOUT CONTRAST TECHNIQUE: Contiguous axial images were obtained from the base of the skull through the vertex without intravenous contrast. COMPARISON:  None. FINDINGS: Brain: No mass lesion, intraparenchymal hemorrhage or extra-axial collection. No evidence of acute cortical infarct. There is periventricular hypoattenuation compatible with chronic microvascular disease. Vascular: No hyperdense vessel or unexpected calcification. Skull: Normal visualized skull base, calvarium and extracranial soft tissues. Sinuses/Orbits: No sinus fluid levels or advanced mucosal thickening. No mastoid effusion. Normal orbits. IMPRESSION: 1. No acute intracranial abnormality. 2. Chronic microvascular ischemia. Electronically Signed   By: Deatra Robinson M.D.   On: 12/03/2015 20:21   Ct Pelvis W Contrast  Result Date: 12/03/2015 CLINICAL DATA:  75 year old patient found prone, in urine and feces. Patient does not communicate for bleed due to dementia. EXAM: CT PELVIS WITH CONTRAST TECHNIQUE: Multidetector CT imaging of the pelvis was performed using the standard protocol following the bolus administration of intravenous contrast. CONTRAST:  75mL ISOVUE-300 IOPAMIDOL (ISOVUE-300) INJECTION 61% COMPARISON:  CT hips and pelvis 10/27/2015 FINDINGS: Urinary Tract: The urinary bladder appears within normal limits and is only mildly to moderately distended. No bladder wall thickening. No evidence of distal ureteral dilatation. Bowel: Prominent stool in the rectum. The stool measures 5.8 cm AP diameter.  Proximal to this stool, the distal sigmoid is clear. Vascular/Lymphatic: There is atherosclerotic vascular calcification of normal caliber common iliac arteries bilaterally, and vascular calcifications extend into the internal iliac arteries bilaterally, with scattered CT ask a shins in the external iliac arteries and femoral arteries. Reproductive: The uterus is unremarkable, besides a dense calcification consistent with a small calcified fibroid. No adnexal mass or free fluid. Other:  Soft tissues of the body wall show no focal abnormalities. Musculoskeletal: Bony pelvis is intact. Both hips are located. The sacroiliac joints are normally aligned. Focal sclerotic lesion in the left ilium, adjacent to the left sacroiliac joint is stable and favored to be benign. Central spinal  stenosis at L4-L5 is unchanged. IMPRESSION: No acute findings in the pelvis. Prominent amount of stool in the rectum. Chronic spinal stenosis at L4-L5. Sclerotic focus left ilium adjacent to the SI joint, stable and favored to be benign. Electronically Signed   By: Britta Mccreedy M.D.   On: 12/03/2015 17:10   Dg Chest Portable 1 View  Result Date: 12/03/2015 CLINICAL DATA:  Dementia, history of previous MI, diabetes, CABG. EXAM: PORTABLE CHEST 1 VIEW COMPARISON:  PA and lateral chest x-ray of October 26, 2015 FINDINGS: The lungs are mildly hypoinflated. The left hemidiaphragm is chronically elevated. There are increased lung markings at both bases which are not entirely new. The ICD generator obscures a portion of the left hemidiaphragm. The heart is not enlarged. The pulmonary vascularity is normal. There is calcification in the wall of the aortic arch. There are post CABG changes. The bony thorax exhibits no acute abnormality. IMPRESSION: No acute cardiopulmonary abnormality allowing for mild hypo inflation. Aortic atherosclerosis. Electronically Signed   By: Anela Bensman  [place] M.D.   On: 12/03/2015 14:59   Dg Knee Complete 4 Views  Left  Result Date: 11/22/2015 CLINICAL DATA:  Knee pain.  No reported injury .  Dementia. EXAM: LEFT KNEE - COMPLETE 4+ VIEW COMPARISON:  No recent prior. FINDINGS: Degenerative changes left knee. No acute bony or joint abnormality identified. No evidence of fracture or dislocation. Multiple prominent loose bodies are noted. Soft tissue surgical clips over the lower extremity noted. IMPRESSION: Severe diffuse degenerative change with multiple loose bodies. No acute abnormality. Electronically Signed   By: Maisie Fus  Register   On: 11/22/2015 13:24   Dg Knee Complete 4 Views Right  Result Date: 11/22/2015 CLINICAL DATA:  Knee pain.  No reported injury.  Dementia. EXAM: RIGHT KNEE - COMPLETE 4+ VIEW COMPARISON:  No recent prior. FINDINGS: Tricompartment degenerative changes. Loose bodies noted. Surgical clips noted over the soft tissues about the right knee. IMPRESSION: Degenerative changes right knee with loose bodies. No acute bony abnormality identified. Electronically Signed   By: Maisie Fus  Register   On: 11/22/2015 13:25   Dg Hips Bilat With Pelvis 2v  Result Date: 11/20/2015 CLINICAL DATA:  Female who presents to the emergency department with her husband and cousin who report the patient has not been able to walk for the past 5 days. She's been complaining of some right leg pain and they have noticed some right lower extremity edema. The patient has a history of dementia and is usually demented. They report she's been mentating at her baseline. She was admitted to the hospital earlier this month for some confusion, UTI and peripheral edema. In the hospital they ruled out a DVT as well as a fracture to her hip using x-ray and CT. EXAM: DG HIP (WITH OR WITHOUT PELVIS) 2V BILAT COMPARISON:  None. FINDINGS: No fracture.  No bone lesion. Hip joints, SI joints and symphysis pubis are normally spaced and aligned. Soft tissues are unremarkable. IMPRESSION: Negative. Electronically Signed   By: Amie Portland M.D.    On: 11/20/2015 19:26    Otelia Hettinger, DO  [facility] Pager [phone]  If 7PM-7AM, please contact night-coverage [email or web address] Password [email or web address] 12/04/2015, 8:05 AM   LOS: 1 day

## 2015-12-04 NOTE — Progress Notes (Signed)
Inpatient Diabetes Program Recommendations  AACE/ADA: New Consensus Statement on Inpatient Glycemic Control (2015)  Target Ranges:  Prepandial:   less than 140 mg/dL      Peak postprandial:   less than 180 mg/dL (1-2 hours)      Critically ill patients:  140 - 180 mg/dL   Lab Results  Component Value Date   GLUCAP 206 (H) 12/04/2015   HGBA1C 9.9 (H) 10/27/2015    Review of Glycemic Control  Diabetes history: DM2 Outpatient Diabetes medications: NPH 5 units q hs+ Amaryl 2 mg qd+ Metformin 500 mg bid Current orders for Inpatient glycemic control: Novolog 0-9 units q 4 hrs  Inpatient Diabetes Program Recommendations:  Please consider basal insulin Lantus 10 units daily while oral medications held.  Thank you, Billy Fischer.  [place], RN, MSN, CDE Inpatient Glycemic Control Team Team Pager [phone] (8am-5pm) 12/04/2015 9:50 AM

## 2015-12-04 NOTE — Care Management Note (Signed)
Case Management Note  Patient Details  Name: Ashlee [name] MRN: [id number] Date of Birth: 1941-02-20  Subjective/Objective:    Sepsis, Acute Encephalopathy, CHF                Action/Plan: Discharge Planning: NCM spoke to pt's husband, Thai Gebre. States he is interested in [facility]. CSW referral for placement. Waiting Palliative Care recommendations.    Expected Discharge Date:                 Expected Discharge Plan:  [facility]  In-House Referral:  Clinical Social Work  Discharge planning Services  CM Consult  Post Acute Care Choice:  NA Choice offered to:  NA  DME Arranged:  N/A DME Agency:  NA  HH Arranged:  NA HH Agency:  NA  Status of Service:  In process, will continue to follow  If discussed at Long Length of Stay Meetings, dates discussed:    Additional Comments:  Elliot Cousin, RN 12/04/2015, 11:14 AM

## 2015-12-04 NOTE — Progress Notes (Signed)
Nutrition Brief Note  Chart reviewed. Pt now transitioning to comfort care.  No further nutrition interventions warranted at this time.  Please re-consult as needed.   Carrina Schoenberger A. Adeoluwa Silvers, RD, LDN, CDE Pager: [phone] After hours Pager: [phone]  

## 2015-12-04 NOTE — Progress Notes (Signed)
PHARMACY - PHYSICIAN COMMUNICATION CRITICAL VALUE ALERT - BLOOD CULTURE IDENTIFICATION (BCID)  Results for orders placed or performed during the hospital encounter of 12/03/15  Blood Culture ID Panel (Reflexed) (Collected: 12/03/2015  2:25 PM)  Result Value Ref Range   Enterococcus species NOT DETECTED NOT DETECTED   Listeria monocytogenes NOT DETECTED NOT DETECTED   Staphylococcus species DETECTED (A) NOT DETECTED   Staphylococcus aureus NOT DETECTED NOT DETECTED   Methicillin resistance DETECTED (A) NOT DETECTED   Streptococcus species NOT DETECTED NOT DETECTED   Streptococcus agalactiae NOT DETECTED NOT DETECTED   Streptococcus pneumoniae NOT DETECTED NOT DETECTED   Streptococcus pyogenes NOT DETECTED NOT DETECTED   Acinetobacter baumannii NOT DETECTED NOT DETECTED   Enterobacteriaceae species NOT DETECTED NOT DETECTED   Enterobacter cloacae complex NOT DETECTED NOT DETECTED   Escherichia coli NOT DETECTED NOT DETECTED   Klebsiella oxytoca NOT DETECTED NOT DETECTED   Klebsiella pneumoniae NOT DETECTED NOT DETECTED   Proteus species NOT DETECTED NOT DETECTED   Serratia marcescens NOT DETECTED NOT DETECTED   Haemophilus influenzae NOT DETECTED NOT DETECTED   Neisseria meningitidis NOT DETECTED NOT DETECTED   Pseudomonas aeruginosa NOT DETECTED NOT DETECTED   Candida albicans NOT DETECTED NOT DETECTED   Candida glabrata NOT DETECTED NOT DETECTED   Candida krusei NOT DETECTED NOT DETECTED   Candida parapsilosis NOT DETECTED NOT DETECTED   Candida tropicalis NOT DETECTED NOT DETECTED    Name of physician (or Provider) Contacted: Dr Tat   Changes to prescribed antibiotics required: None  Isaac Bliss, PharmD, BCPS, [facility] Clinical Pharmacist Pager [phone] 12/04/2015 11:08 AM

## 2015-12-04 NOTE — Consult Note (Signed)
WOC Nurse wound consult note Reason for Consult: pressure injuries  Patient is incontinent of urine and stool, with current open areas location may be more consistent with MASD (moisture associated skin damage).  Areas are slightly inside the gluteal fold. Etiology mixed with both pressure and moisture Wound type: Stage 2 Pressure Injuries x 3  Pressure Ulcer POA: Yes Measurement: See nursing notes, all are accurate Wound bed: 3 areas all partial thickness, pink, moist, clean Drainage (amount, consistency, odor) minimal  Periwound: intact  Dressing procedure/placement/frequency: Continue soft silicone foam dressings to protect and insulate. Unless dressings continually stay soaked with urine, then will need to change to barrier cream instead.  Patient has dementia but can be turned from side to side.   Discussed POC with bedside nurse.  Re consult if needed, will not follow at this time. Thanks  Ilayda Toda [employer], RN,CNS, CWOCN [phone])

## 2015-12-05 DIAGNOSIS — B9562 Methicillin resistant Staphylococcus aureus infection as the cause of diseases classified elsewhere: Secondary | ICD-10-CM

## 2015-12-05 DIAGNOSIS — E1310 Other specified diabetes mellitus with ketoacidosis without coma: Secondary | ICD-10-CM

## 2015-12-05 DIAGNOSIS — R6510 Systemic inflammatory response syndrome (SIRS) of non-infectious origin without acute organ dysfunction: Secondary | ICD-10-CM

## 2015-12-05 DIAGNOSIS — R7881 Bacteremia: Secondary | ICD-10-CM

## 2015-12-05 LAB — CBC
HCT: 35.8 % — ABNORMAL LOW (ref 36.0–46.0)
Hemoglobin: 11.2 g/dL — ABNORMAL LOW (ref 12.0–15.0)
MCH: 26.4 pg (ref 26.0–34.0)
MCHC: 31.3 g/dL (ref 30.0–36.0)
MCV: 84.4 fL (ref 78.0–100.0)
PLATELETS: 203 10*3/uL (ref 150–400)
RBC: 4.24 MIL/uL (ref 3.87–5.11)
RDW: 15 % (ref 11.5–15.5)
WBC: 10.8 10*3/uL — AB (ref 4.0–10.5)

## 2015-12-05 LAB — GLUCOSE, CAPILLARY
GLUCOSE-CAPILLARY: 157 mg/dL — AB (ref 65–99)
GLUCOSE-CAPILLARY: 158 mg/dL — AB (ref 65–99)
GLUCOSE-CAPILLARY: 183 mg/dL — AB (ref 65–99)
GLUCOSE-CAPILLARY: 223 mg/dL — AB (ref 65–99)
Glucose-Capillary: 162 mg/dL — ABNORMAL HIGH (ref 65–99)

## 2015-12-05 LAB — BASIC METABOLIC PANEL
ANION GAP: 7 (ref 5–15)
BUN: 14 mg/dL (ref 6–20)
CALCIUM: 8.6 mg/dL — AB (ref 8.9–10.3)
CO2: 22 mmol/L (ref 22–32)
Chloride: 120 mmol/L — ABNORMAL HIGH (ref 101–111)
Creatinine, Ser: 0.82 mg/dL (ref 0.44–1.00)
Glucose, Bld: 162 mg/dL — ABNORMAL HIGH (ref 65–99)
Potassium: 3.3 mmol/L — ABNORMAL LOW (ref 3.5–5.1)
SODIUM: 149 mmol/L — AB (ref 135–145)

## 2015-12-05 LAB — CK: CK TOTAL: 1371 U/L — AB (ref 38–234)

## 2015-12-05 LAB — MAGNESIUM: MAGNESIUM: 1.9 mg/dL (ref 1.7–2.4)

## 2015-12-05 MED ORDER — MORPHINE SULFATE (CONCENTRATE) 10 MG /0.5 ML PO SOLN: 4.0000 mg | ORAL | 0 refills | Status: AC | PRN

## 2015-12-05 MED ORDER — SODIUM CHLORIDE 0.9 % IV SOLN: 250.0000 mL | INTRAVENOUS | Status: DC | PRN

## 2015-12-05 MED ORDER — POLYVINYL ALCOHOL 1.4 % OP SOLN
1.0000 [drp] | Freq: Four times a day (QID) | OPHTHALMIC | Status: DC | PRN
  Filled 2015-12-05: qty 15

## 2015-12-05 MED ORDER — BIOTENE DRY MOUTH MT LIQD: 15.0000 mL | OROMUCOSAL | Status: DC | PRN

## 2015-12-05 MED ORDER — LORAZEPAM 2 MG/ML IJ SOLN: 1.0000 mg | INTRAMUSCULAR | Status: DC | PRN

## 2015-12-05 MED ORDER — SODIUM CHLORIDE 0.9% FLUSH: 3.0000 mL | INTRAVENOUS | Status: DC | PRN

## 2015-12-05 MED ORDER — SODIUM CHLORIDE 0.9% FLUSH: 3.0000 mL | Freq: Two times a day (BID) | INTRAVENOUS | Status: DC

## 2015-12-05 NOTE — Progress Notes (Signed)
Contacted [employer] to have patient's ICD changed to "OFF" status, to be able to discharge to [facility].  Spoke with a representative @ [employer].  Local rep called back and stated that she was in the building and would be up shortly to turn OFF the ICD.  Peri Maris, MBA, BSN, RN

## 2015-12-05 NOTE — Progress Notes (Signed)
Speech Language Pathology Treatment: Dysphagia  Patient Details Name: Ashlee [name] MRN: [id number] DOB: 1940-09-05 Today's Date: 12/05/2015 Time: 1000-1010 SLP Time Calculation (min) (ACUTE ONLY): 10 min  Assessment / Plan / Recommendation Clinical Impression  Diagnostic po trials provided. Performance consistent with bedside swallow evaluation in which patient is lethargic, arouses easily however quickly falls back to sleep resulting in requiring cues for awareness of bolus and clearance of solids from oral cavity due to oral holding.  Once swallow initiated, pharyngeal swallow appearing functional without overt indication of aspiration. Noted that following SLP treatment this am, orders d/c'd. Per PA notes, plans are for residential hospice, comfort care. Overall, current diet appears safe with AMS being largest barrier to po intake and risk of aspiration. Please re-consult if needed.    HPI HPI: 75 year old female admitted 12/03/15 due to AMS, progressive weakness, decreased responsiveness. PMH significant for advanced dementia, ischemic cardiomyopathy, MI, CABG, HTN, HLD, DM.  BSE ordered to determine safe po level      SLP Plan  Discharge SLP treatment due to (comment) (see clinical impression)     Recommendations  Diet recommendations: Dysphagia 1 (puree);Thin liquid Liquids provided via: Cup;Straw Medication Administration: Crushed with puree Supervision: Staff to assist with self feeding;Full supervision/cueing for compensatory strategies Compensations: Minimize environmental distractions;Slow rate;Small sips/bites Postural Changes and/or Swallow Maneuvers: Seated upright 90 degrees                Oral Care Recommendations: Oral care BID Plan: Discharge SLP treatment due to (comment) (see clinical impression)       GO             Ferdinand Lango MA, CCC-SLP [phone]    Ashlee [name] 12/05/2015, 11:18 AM

## 2015-12-05 NOTE — Progress Notes (Signed)
Patient discharged to Comfort Care/hospice at [facility]. IV removed. Telemetry removed. Patients belongings with family.  Plan of care reviewed with family. Patient left with transportation on stretcher.  Avelina Laine RN

## 2015-12-05 NOTE — Discharge Summary (Signed)
Physician Discharge Summary  Ashlee [name] ZOX:[id number] DOB: 1940/08/08 DOA: 12/03/2015  PCP: Junious Silk, MD  Admit date: 12/03/2015 Discharge date: 12/05/2015  Admitted From: Home Disposition:  Residential Hospice     Brief/Interim Summary: 75 y/o female with history of ischemic cardiomyopathy (EF 30-35%), DM2, HTN, CAD/CABG, HLD presented with altered mental status change. Due to the patient's dementia and acute encephalopathy, she was unable to provide any history. According to the husband, the patient has had waxing and waning episodes of mental status. On the day of admission, the patient had increased lethargy and was felt hot to touch. The patient has had decreased oral intake for several days. According to the patient's husband, the patient has had declining functional status for the past few months. The patient was recently discharged from the hospital on 11/22/2015 after treatment for acute renal failure secondary to dehydration. Upon presentation, the patient was noted to be hypernatremic with a sodium 147, and she was found to be in acute kidney injury with serum creatinine 1.40 and WBC 12.0. Code sepsis was activated, the patient was given the appropriate fluid boluses and started on intravenous antibiotics after cultures were obtained.  Palliative medicine was consulted.  After discussion with the patient's husband, the patient's focus of care was changed to that to focus on full comfort. Thereafter, all medications with curative intent were discontinued and social work facilitated transfer to residential hospice.  Discharge Diagnoses:  Sepsis -Present at the time of admission with elevated lactic acid, fever, and leukocytosis -Likely due to urinary source -Initially Continued antibiotics pending culture data -Continue IV fluids -Presenting lactic acid 2.60 -Palliative medicine was consulted.  After discussion with the patient's husband, the patient's focus of care  was changed to that to focus on full comfort. Thereafter, all medications with curative intent were discontinued and social work facilitated transfer to residential hospice.  Acute encephalopathy -Multifactorial including acute kidney injury, dehydration, rhabdomyolysis, DKA, and possible UTI -Mental status gradually improving with fluid resuscitation and antibiotics -CT brain negative -Ammonia 18  Pyuria -Initially Continued antibiotics pending culture data  DKA type 2 -Patient presented with serum glucose 359 with metabolic acidosis and anion gap 15 with ketonuria -Resolved -Continue [place] insulin -10/27/2015 hemoglobin A1c 9.9  Rhabdomyolysis -CPK 2306 at the time of presentation -Continue IV fluids -CK improving-->1371  Acute kidney injury -Secondary to volume depletion in the setting of rhabdomyolysis -Improving with fluid resuscitation  Sacral Decubitus ulcer--stage 2 -present at time of presentation -wound care consultation--Continue soft silicone foam dressings to protect and insulate. Unless dressings continually stay soaked with urine, then will need to change to barrier cream instead.  Patient has dementia but can be turned from side to side.   Diabetes mellitus type 2, uncontrolled -10/27/2015 hemoglobin A1c 9.9 -Palliative medicine was consulted.  After discussion with the patient's husband, the patient's focus of care was changed to that to focus on full comfort. Thereafter, all medications with curative intent were discontinued and social work facilitated transfer to residential hospice.  Chronic systolic CHF -Status post AICD -Appears clinically euvolemic -10/27/2015 echo EF 30-35%, grade 1 DD, mild-mod AS -daily weights  Dementia -husband requested palliative care consult for GOC -Palliative has been consulted  Goals of care Palliative medicine was consulted.  After discussion with the patient's husband, the patient's focus of care was changed to  that to focus on full comfort. Thereafter, all medications with curative intent were discontinued and social work facilitated transfer to residential hospice.    Discharge  Instructions     Medication List    STOP taking these medications   aspirin 81 MG tablet   BYETTA 10 MCG PEN 10 MCG/0.04ML Sopn injection Generic drug:  exenatide   carvedilol 6.25 MG tablet Commonly known as:  COREG   CENTRUM SILVER ADULT 50+ PO   diclofenac sodium 1 % Gel Commonly known as:  VOLTAREN   ergocalciferol 50000 units capsule Commonly known as:  VITAMIN D2   glimepiride 2 MG tablet Commonly known as:  AMARYL   insulin NPH Human 100 UNIT/ML injection Commonly known as:  HUMULIN N,NOVOLIN N   Iron 325 (65 Fe) MG Tabs   LORazepam 0.5 MG tablet Commonly known as:  ATIVAN   metFORMIN 500 MG tablet Commonly known as:  GLUCOPHAGE   rosuvastatin 40 MG tablet Commonly known as:  CRESTOR   SMARTEST TEST test strip Generic drug:  glucose blood   spironolactone 25 MG tablet Commonly known as:  ALDACTONE   traMADol 50 MG tablet Commonly known as:  ULTRAM     TAKE these medications   BEPREVE 1.5 % Soln Generic drug:  Bepotastine Besilate Place 1 drop into both eyes daily as needed (EYES).   morphine CONCENTRATE 10 mg / 0.5 ml concentrated solution Place 0.2 mLs (4 mg total) under the tongue every 2 (two) hours as needed for severe pain, anxiety or shortness of breath.       Allergies  Allergen Reactions  . Demerol Nausea And Vomiting    Perfuse vomitting  . Percodan [Oxycodone-Aspirin] Nausea And Vomiting    Perfuse vomitting    Consultations:  Palliative medicine   Procedures/Studies: Dg Tibia/fibula Right  Result Date: 11/20/2015 CLINICAL DATA:  Female who presents to the emergency department with her husband and cousin who report the patient has not been able to walk for the past 5 days. She's been complaining of some right leg pain and they have noticed some right  lower extremity edema. The patient has a history of dementia and is usually demented. They report she's been mentating at her baseline. She was admitted to the hospital earlier this month for some confusion, UTI and peripheral edema. In the hospital they ruled out a DVT as well as a fracture to her hip using x-ray and CT. EXAM: RIGHT TIBIA AND FIBULA - 2 VIEW COMPARISON:  None. FINDINGS: No fracture.  No bone lesion. Knee joint is normally aligned. There is marginal spurring consistent with osteoarthritis. Ankle joint is normally spaced and aligned. There is mild subcutaneous soft tissue edema diffusely. Surgical vascular clips are noted along the medial aspect of the knee. IMPRESSION: No fracture or bone lesion. No dislocation. Knee joint osteoarthritis. Electronically Signed   By: Amie Portland M.D.   On: 11/20/2015 19:27   Ct Head Wo Contrast  Result Date: 12/03/2015 CLINICAL DATA:  Weakness.  Altered mental status. EXAM: CT HEAD WITHOUT CONTRAST TECHNIQUE: Contiguous axial images were obtained from the base of the skull through the vertex without intravenous contrast. COMPARISON:  None. FINDINGS: Brain: No mass lesion, intraparenchymal hemorrhage or extra-axial collection. No evidence of acute cortical infarct. There is periventricular hypoattenuation compatible with chronic microvascular disease. Vascular: No hyperdense vessel or unexpected calcification. Skull: Normal visualized skull base, calvarium and extracranial soft tissues. Sinuses/Orbits: No sinus fluid levels or advanced mucosal thickening. No mastoid effusion. Normal orbits. IMPRESSION: 1. No acute intracranial abnormality. 2. Chronic microvascular ischemia. Electronically Signed   By: Deatra Robinson M.D.   On: 12/03/2015 20:21   Ct  Pelvis W Contrast  Result Date: 12/03/2015 CLINICAL DATA:  75 year old patient found prone, in urine and feces. Patient does not communicate for bleed due to dementia. EXAM: CT PELVIS WITH CONTRAST TECHNIQUE:  Multidetector CT imaging of the pelvis was performed using the standard protocol following the bolus administration of intravenous contrast. CONTRAST:  16mL ISOVUE-300 IOPAMIDOL (ISOVUE-300) INJECTION 61% COMPARISON:  CT hips and pelvis 10/27/2015 FINDINGS: Urinary Tract: The urinary bladder appears within normal limits and is only mildly to moderately distended. No bladder wall thickening. No evidence of distal ureteral dilatation. Bowel: Prominent stool in the rectum. The stool measures 5.8 cm AP diameter. Proximal to this stool, the distal sigmoid is clear. Vascular/Lymphatic: There is atherosclerotic vascular calcification of normal caliber common iliac arteries bilaterally, and vascular calcifications extend into the internal iliac arteries bilaterally, with scattered CT ask a shins in the external iliac arteries and femoral arteries. Reproductive: The uterus is unremarkable, besides a dense calcification consistent with a small calcified fibroid. No adnexal mass or free fluid. Other:  Soft tissues of the body wall show no focal abnormalities. Musculoskeletal: Bony pelvis is intact. Both hips are located. The sacroiliac joints are normally aligned. Focal sclerotic lesion in the left ilium, adjacent to the left sacroiliac joint is stable and favored to be benign. Central spinal stenosis at L4-L5 is unchanged. IMPRESSION: No acute findings in the pelvis. Prominent amount of stool in the rectum. Chronic spinal stenosis at L4-L5. Sclerotic focus left ilium adjacent to the SI joint, stable and favored to be benign. Electronically Signed   By: Britta Mccreedy M.D.   On: 12/03/2015 17:10   Dg Chest Portable 1 View  Result Date: 12/03/2015 CLINICAL DATA:  Dementia, history of previous MI, diabetes, CABG. EXAM: PORTABLE CHEST 1 VIEW COMPARISON:  PA and lateral chest x-ray of October 26, 2015 FINDINGS: The lungs are mildly hypoinflated. The left hemidiaphragm is chronically elevated. There are increased lung markings  at both bases which are not entirely new. The ICD generator obscures a portion of the left hemidiaphragm. The heart is not enlarged. The pulmonary vascularity is normal. There is calcification in the wall of the aortic arch. There are post CABG changes. The bony thorax exhibits no acute abnormality. IMPRESSION: No acute cardiopulmonary abnormality allowing for mild hypo inflation. Aortic atherosclerosis. Electronically Signed   By: Arjuna Doeden  [place] M.D.   On: 12/03/2015 14:59   Dg Knee Complete 4 Views Left  Result Date: 11/22/2015 CLINICAL DATA:  Knee pain.  No reported injury .  Dementia. EXAM: LEFT KNEE - COMPLETE 4+ VIEW COMPARISON:  No recent prior. FINDINGS: Degenerative changes left knee. No acute bony or joint abnormality identified. No evidence of fracture or dislocation. Multiple prominent loose bodies are noted. Soft tissue surgical clips over the lower extremity noted. IMPRESSION: Severe diffuse degenerative change with multiple loose bodies. No acute abnormality. Electronically Signed   By: Maisie Fus  Register   On: 11/22/2015 13:24   Dg Knee Complete 4 Views Right  Result Date: 11/22/2015 CLINICAL DATA:  Knee pain.  No reported injury.  Dementia. EXAM: RIGHT KNEE - COMPLETE 4+ VIEW COMPARISON:  No recent prior. FINDINGS: Tricompartment degenerative changes. Loose bodies noted. Surgical clips noted over the soft tissues about the right knee. IMPRESSION: Degenerative changes right knee with loose bodies. No acute bony abnormality identified. Electronically Signed   By: Maisie Fus  Register   On: 11/22/2015 13:25   Dg Hips Bilat With Pelvis 2v  Result Date: 11/20/2015 CLINICAL DATA:  Female who presents to  the emergency department with her husband and cousin who report the patient has not been able to walk for the past 5 days. She's been complaining of some right leg pain and they have noticed some right lower extremity edema. The patient has a history of dementia and is usually demented. They report  she's been mentating at her baseline. She was admitted to the hospital earlier this month for some confusion, UTI and peripheral edema. In the hospital they ruled out a DVT as well as a fracture to her hip using x-ray and CT. EXAM: DG HIP (WITH OR WITHOUT PELVIS) 2V BILAT COMPARISON:  None. FINDINGS: No fracture.  No bone lesion. Hip joints, SI joints and symphysis pubis are normally spaced and aligned. Soft tissues are unremarkable. IMPRESSION: Negative. Electronically Signed   By: Amie Portland M.D.   On: 11/20/2015 19:26        Discharge Exam: Vitals:   12/05/15 0457 12/05/15 0854  BP: (!) 132/57 (!) 111/53  Pulse: 79 85  Resp: 16 16  Temp: 97.5 F (36.4 C) 98 F (36.7 C)   Vitals:   12/04/15 1739 12/04/15 2156 12/05/15 0457 12/05/15 0854  BP: (!) 101/48 125/61 (!) 132/57 (!) 111/53  Pulse: 60 75 79 85  Resp: 18 16 16 16   Temp: 97.5 F (36.4 C) 97.4 F (36.3 C) 97.5 F (36.4 C) 98 F (36.7 C)  TempSrc: Oral Oral Axillary Oral  SpO2: 100% 98% 96% 100%  Weight:      Height:        General: Pt is  not in acute distress Cardiovascular: RRR, S1/S2 +, no rubs, no gallops Respiratory: CTA bilaterally, no wheezing, no rhonchi Abdominal: Soft, NT, ND, bowel sounds + Extremities: no edema, no cyanosis   The results of significant diagnostics from this hospitalization (including imaging, microbiology, ancillary and laboratory) are listed below for reference.    Significant Diagnostic Studies: Dg Tibia/fibula Right  Result Date: 11/20/2015 CLINICAL DATA:  Female who presents to the emergency department with her husband and cousin who report the patient has not been able to walk for the past 5 days. She's been complaining of some right leg pain and they have noticed some right lower extremity edema. The patient has a history of dementia and is usually demented. They report she's been mentating at her baseline. She was admitted to the hospital earlier this month for some confusion,  UTI and peripheral edema. In the hospital they ruled out a DVT as well as a fracture to her hip using x-ray and CT. EXAM: RIGHT TIBIA AND FIBULA - 2 VIEW COMPARISON:  None. FINDINGS: No fracture.  No bone lesion. Knee joint is normally aligned. There is marginal spurring consistent with osteoarthritis. Ankle joint is normally spaced and aligned. There is mild subcutaneous soft tissue edema diffusely. Surgical vascular clips are noted along the medial aspect of the knee. IMPRESSION: No fracture or bone lesion. No dislocation. Knee joint osteoarthritis. Electronically Signed   By: Amie Portland M.D.   On: 11/20/2015 19:27   Ct Head Wo Contrast  Result Date: 12/03/2015 CLINICAL DATA:  Weakness.  Altered mental status. EXAM: CT HEAD WITHOUT CONTRAST TECHNIQUE: Contiguous axial images were obtained from the base of the skull through the vertex without intravenous contrast. COMPARISON:  None. FINDINGS: Brain: No mass lesion, intraparenchymal hemorrhage or extra-axial collection. No evidence of acute cortical infarct. There is periventricular hypoattenuation compatible with chronic microvascular disease. Vascular: No hyperdense vessel or unexpected calcification. Skull: Normal visualized skull  base, calvarium and extracranial soft tissues. Sinuses/Orbits: No sinus fluid levels or advanced mucosal thickening. No mastoid effusion. Normal orbits. IMPRESSION: 1. No acute intracranial abnormality. 2. Chronic microvascular ischemia. Electronically Signed   By: Deatra Robinson M.D.   On: 12/03/2015 20:21   Ct Pelvis W Contrast  Result Date: 12/03/2015 CLINICAL DATA:  75 year old patient found prone, in urine and feces. Patient does not communicate for bleed due to dementia. EXAM: CT PELVIS WITH CONTRAST TECHNIQUE: Multidetector CT imaging of the pelvis was performed using the standard protocol following the bolus administration of intravenous contrast. CONTRAST:  75mL ISOVUE-300 IOPAMIDOL (ISOVUE-300) INJECTION 61%  COMPARISON:  CT hips and pelvis 10/27/2015 FINDINGS: Urinary Tract: The urinary bladder appears within normal limits and is only mildly to moderately distended. No bladder wall thickening. No evidence of distal ureteral dilatation. Bowel: Prominent stool in the rectum. The stool measures 5.8 cm AP diameter. Proximal to this stool, the distal sigmoid is clear. Vascular/Lymphatic: There is atherosclerotic vascular calcification of normal caliber common iliac arteries bilaterally, and vascular calcifications extend into the internal iliac arteries bilaterally, with scattered CT ask a shins in the external iliac arteries and femoral arteries. Reproductive: The uterus is unremarkable, besides a dense calcification consistent with a small calcified fibroid. No adnexal mass or free fluid. Other:  Soft tissues of the body wall show no focal abnormalities. Musculoskeletal: Bony pelvis is intact. Both hips are located. The sacroiliac joints are normally aligned. Focal sclerotic lesion in the left ilium, adjacent to the left sacroiliac joint is stable and favored to be benign. Central spinal stenosis at L4-L5 is unchanged. IMPRESSION: No acute findings in the pelvis. Prominent amount of stool in the rectum. Chronic spinal stenosis at L4-L5. Sclerotic focus left ilium adjacent to the SI joint, stable and favored to be benign. Electronically Signed   By: Britta Mccreedy M.D.   On: 12/03/2015 17:10   Dg Chest Portable 1 View  Result Date: 12/03/2015 CLINICAL DATA:  Dementia, history of previous MI, diabetes, CABG. EXAM: PORTABLE CHEST 1 VIEW COMPARISON:  PA and lateral chest x-ray of October 26, 2015 FINDINGS: The lungs are mildly hypoinflated. The left hemidiaphragm is chronically elevated. There are increased lung markings at both bases which are not entirely new. The ICD generator obscures a portion of the left hemidiaphragm. The heart is not enlarged. The pulmonary vascularity is normal. There is calcification in the wall  of the aortic arch. There are post CABG changes. The bony thorax exhibits no acute abnormality. IMPRESSION: No acute cardiopulmonary abnormality allowing for mild hypo inflation. Aortic atherosclerosis. Electronically Signed   By: Tameko Halder  [place] M.D.   On: 12/03/2015 14:59   Dg Knee Complete 4 Views Left  Result Date: 11/22/2015 CLINICAL DATA:  Knee pain.  No reported injury .  Dementia. EXAM: LEFT KNEE - COMPLETE 4+ VIEW COMPARISON:  No recent prior. FINDINGS: Degenerative changes left knee. No acute bony or joint abnormality identified. No evidence of fracture or dislocation. Multiple prominent loose bodies are noted. Soft tissue surgical clips over the lower extremity noted. IMPRESSION: Severe diffuse degenerative change with multiple loose bodies. No acute abnormality. Electronically Signed   By: Maisie Fus  Register   On: 11/22/2015 13:24   Dg Knee Complete 4 Views Right  Result Date: 11/22/2015 CLINICAL DATA:  Knee pain.  No reported injury.  Dementia. EXAM: RIGHT KNEE - COMPLETE 4+ VIEW COMPARISON:  No recent prior. FINDINGS: Tricompartment degenerative changes. Loose bodies noted. Surgical clips noted over the soft tissues about the  right knee. IMPRESSION: Degenerative changes right knee with loose bodies. No acute bony abnormality identified. Electronically Signed   By: Maisie Fushomas  Register   On: 11/22/2015 13:25   Dg Hips Bilat With Pelvis 2v  Result Date: 11/20/2015 CLINICAL DATA:  Female who presents to the emergency department with her husband and cousin who report the patient has not been able to walk for the past 5 days. She's been complaining of some right leg pain and they have noticed some right lower extremity edema. The patient has a history of dementia and is usually demented. They report she's been mentating at her baseline. She was admitted to the hospital earlier this month for some confusion, UTI and peripheral edema. In the hospital they ruled out a DVT as well as a fracture to her hip  using x-ray and CT. EXAM: DG HIP (WITH OR WITHOUT PELVIS) 2V BILAT COMPARISON:  None. FINDINGS: No fracture.  No bone lesion. Hip joints, SI joints and symphysis pubis are normally spaced and aligned. Soft tissues are unremarkable. IMPRESSION: Negative. Electronically Signed   By: Amie Portlandavid  Ormond M.D.   On: 11/20/2015 19:26     Microbiology: Recent Results (from the past 240 hour(s))  Blood Culture (routine x 2)     Status: None (Preliminary result)   Collection Time: 12/03/15  2:20 PM  Result Value Ref Range Status   Specimen Description BLOOD LEFT ANTECUBITAL  Final   Special Requests BOTTLES DRAWN AEROBIC AND ANAEROBIC 5CC  Final   Culture NO GROWTH 2 DAYS  Final   Report Status PENDING  Incomplete  Blood Culture (routine x 2)     Status: Abnormal (Preliminary result)   Collection Time: 12/03/15  2:25 PM  Result Value Ref Range Status   Specimen Description BLOOD RIGHT ANTECUBITAL  Final   Special Requests BOTTLES DRAWN AEROBIC AND ANAEROBIC 5CC  Final   Culture  Setup Time   Final    GRAM POSITIVE COCCI IN CLUSTERS IN BOTH AEROBIC AND ANAEROBIC BOTTLES CRITICAL RESULT CALLED TO, READ BACK BY AND VERIFIED WITH: M. MACCIA PHARMD, AT 1103 12/04/15 BY D. VANHOOK    Culture (A)  Final    STAPHYLOCOCCUS SPECIES (COAGULASE NEGATIVE) THE SIGNIFICANCE OF ISOLATING THIS ORGANISM FROM A SINGLE SET OF BLOOD CULTURES WHEN MULTIPLE SETS ARE DRAWN IS UNCERTAIN. PLEASE NOTIFY THE MICROBIOLOGY DEPARTMENT WITHIN ONE WEEK IF SPECIATION AND SENSITIVITIES ARE REQUIRED.    Report Status PENDING  Incomplete  Blood Culture ID Panel (Reflexed)     Status: Abnormal   Collection Time: 12/03/15  2:25 PM  Result Value Ref Range Status   Enterococcus species NOT DETECTED NOT DETECTED Final   Listeria monocytogenes NOT DETECTED NOT DETECTED Final   Staphylococcus species DETECTED (A) NOT DETECTED Final    Comment: CRITICAL RESULT CALLED TO, READ BACK BY AND VERIFIED WITH: M. MACCIA PHARMD, AT 1103 12/04/15 BY  D. VANHOOK    Staphylococcus aureus NOT DETECTED NOT DETECTED Final   Methicillin resistance DETECTED (A) NOT DETECTED Final    Comment: CRITICAL RESULT CALLED TO, READ BACK BY AND VERIFIED WITH: M. MACCIA PHARMD, AT 1103 12/04/15 BY D. VANHOOK    Streptococcus species NOT DETECTED NOT DETECTED Final   Streptococcus agalactiae NOT DETECTED NOT DETECTED Final   Streptococcus pneumoniae NOT DETECTED NOT DETECTED Final   Streptococcus pyogenes NOT DETECTED NOT DETECTED Final   Acinetobacter baumannii NOT DETECTED NOT DETECTED Final   Enterobacteriaceae species NOT DETECTED NOT DETECTED Final   Enterobacter cloacae complex NOT DETECTED NOT  DETECTED Final   Escherichia coli NOT DETECTED NOT DETECTED Final   Klebsiella oxytoca NOT DETECTED NOT DETECTED Final   Klebsiella pneumoniae NOT DETECTED NOT DETECTED Final   Proteus species NOT DETECTED NOT DETECTED Final   Serratia marcescens NOT DETECTED NOT DETECTED Final   Haemophilus influenzae NOT DETECTED NOT DETECTED Final   Neisseria meningitidis NOT DETECTED NOT DETECTED Final   Pseudomonas aeruginosa NOT DETECTED NOT DETECTED Final   Candida albicans NOT DETECTED NOT DETECTED Final   Candida glabrata NOT DETECTED NOT DETECTED Final   Candida krusei NOT DETECTED NOT DETECTED Final   Candida parapsilosis NOT DETECTED NOT DETECTED Final   Candida tropicalis NOT DETECTED NOT DETECTED Final  Wet prep, genital     Status: Abnormal   Collection Time: 12/03/15  5:14 PM  Result Value Ref Range Status   Yeast Wet Prep HPF POC NONE SEEN NONE SEEN Final   Trich, Wet Prep NONE SEEN NONE SEEN Final   Clue Cells Wet Prep HPF POC NONE SEEN NONE SEEN Final   WBC, Wet Prep HPF POC MANY (A) NONE SEEN Final   Sperm NONE SEEN  Final  MRSA PCR Screening     Status: None   Collection Time: 12/03/15  8:30 PM  Result Value Ref Range Status   MRSA by PCR NEGATIVE NEGATIVE Final    Comment:        The GeneXpert MRSA Assay (FDA approved for NASAL  specimens only), is one component of a comprehensive MRSA colonization surveillance program. It is not intended to diagnose MRSA infection nor to guide or monitor treatment for MRSA infections.      Labs: Basic Metabolic Panel:  Recent Labs Lab 12/03/15 1419 12/04/15 0140 12/05/15 0501  NA 147* 147* 149*  K 4.4 3.4* 3.3*  CL 113* 118* 120*  CO2 19* 22 22  GLUCOSE 359* 348* 162*  BUN 32* 24* 14  CREATININE 1.40* 0.88 0.82  CALCIUM 9.4 8.3* 8.6*  MG  --   --  1.9   Liver Function Tests:  Recent Labs Lab 12/03/15 1419 12/04/15 0140  AST 48* 47*  ALT 21 21  ALKPHOS 78 59  BILITOT 0.6 0.7  PROT 7.7 5.4*  ALBUMIN 2.4* 1.8*   No results for input(s): LIPASE, AMYLASE in the last 168 hours.  Recent Labs Lab 12/03/15 2132  AMMONIA 18   CBC:  Recent Labs Lab 12/03/15 1419 12/04/15 0140 12/05/15 0501  WBC 12.0* 12.9* 10.8*  NEUTROABS 9.7*  --   --   HGB 14.9 11.1* 11.2*  HCT 46.5* 35.8* 35.8*  MCV 84.5 84.2 84.4  PLT 298 238 203   Cardiac Enzymes:  Recent Labs Lab 12/03/15 2015 12/03/15 2132 12/05/15 0501  CKTOTAL  --  2,306* 1,371*  TROPONINI 0.16*  --   --    BNP: Invalid input(s): POCBNP CBG:  Recent Labs Lab 12/04/15 2016 12/05/15 0032 12/05/15 0504 12/05/15 0753 12/05/15 1210  GLUCAP 158* 183* 157* 162* 223*    Time coordinating discharge:  Greater than 30 minutes  Signed:  Bruna Dills, DO [facility] Pager: [phone] 12/05/2015, 2:30 PM

## 2015-12-05 NOTE — Clinical Social Work Note (Signed)
Patient is now comfort care and CSW received consult for residential hospice placement. Talked with Forrestine Him, LCSW with [facility] and no beds today. CSW also talked with Nelson Chimes with [facility] and they may have availability. After evaluation, patient determined to be appropriate for [facility] and will discharge today. Patient's husband at the bedside today and has completed paperwork with [facility]. Mrs. Tempel will be transported by ambulance to hospice facility.   Genelle Bal, MSW, LCSW Licensed Clinical Social Worker Clinical Social Work Department [employer] [phone]

## 2015-12-05 NOTE — Progress Notes (Signed)
Chaplain presented to the patient's room, she was not awake/responsive at the time of this visit; however the husband is at the bedside. Provided the ministry of presence as he shared how hard his wife's current medical status is for him. He states they have been married for fourth-three years, they have no children, so it has been them together sharing life. They are connected to a faith community, have  extended family, and a friends support system. Mr. Dissinger shared that his sister, his only sibling dies six month ago, so it is emotionally  hard on him with his wife's medical decline in that he is yet grieving that loss. Chaplain offered empathic listening, encouragement, and a prayer for peace and comfort for him and his wife. Chaplain will continue to provide spiritual care support. Chaplain Janell Quiet [phone]

## 2015-12-05 NOTE — Progress Notes (Addendum)
Daily Progress Note   Patient Name: Ashlee [name]       Date: 12/05/2015 DOB: 09/10/1940  Age: 75 y.o. MRN#: [id number] Attending Physician: Catarina Hartshornavid Tat, MD Primary Care Physician: Junious SilkALTHEIMER,MICHAEL D, MD Admit Date: 12/03/2015  Reason for Consultation/Follow-up: Establishing goals of care  Subjective: Patient pleasant but can't form intelligible words.  Sleepy.  Unable to work with speech.  Takes less than a bite of pudding.  Appears to have no interest in eating.  Spoke with Reatha ArmourAlbert Odonovan on the phone.  He reports she has not been eating or drinking at home. He understands where she is in life and he requests residential hospice, "I have no choice".  Length of Stay: 2  Current Medications: Scheduled Meds:  . chlorhexidine  15 mL Mouth Rinse BID  . insulin aspart  0-9 Units Subcutaneous Q4H  . mouth rinse  15 mL Mouth Rinse q12n4p  . piperacillin-tazobactam (ZOSYN)  IV  3.375 g Intravenous Q8H  . sodium chloride flush  3 mL Intravenous Q12H  . vancomycin  1,250 mg Intravenous Q24H    Continuous Infusions:    PRN Meds: acetaminophen **OR** acetaminophen  Physical Exam      Pleasantly demented female,  NAD CV rrr Resp NAD Abdomen: soft Neuro:  Unable to form a clear word.  Sleepy.     Vital Signs: BP (!) 111/53 (BP Location: Left Arm)   Pulse 85   Temp 98 F (36.7 C) (Oral)   Resp 16   Ht 5\' 3"  (1.6 m)   Wt 86 kg (189 lb 9.5 oz)   SpO2 100%   BMI 33.59 kg/m  SpO2: SpO2: 100 % O2 Device: O2 Device: Not Delivered O2 Flow Rate: O2 Flow Rate (L/min): 2 L/min  Intake/output summary:  Intake/Output Summary (Last 24 hours) at 12/05/15 0939 Last data filed at 12/05/15 0828  Gross per 24 hour  Intake           1807.5 ml  Output                0 ml  Net           1807.5  ml   LBM: Last BM Date: 12/03/15 Baseline Weight: Weight: 92.1 kg (203 lb) Most recent weight: Weight: 86 kg (189 lb 9.5 oz)  Palliative Assessment/Data:    Flowsheet Rows   Flowsheet Row Most Recent Value  Intake Tab  Referral Department  Hospitalist  Unit at Time of Referral  Intermediate Care Unit  Palliative Care Primary Diagnosis  Sepsis/Infectious Disease  Date Notified  12/03/15  Palliative Care Type  New Palliative care  Reason for referral  Clarify Goals of Care  Date of Admission  12/03/15  Date first seen by Palliative Care  12/04/15  # of days Palliative referral response time  1 Day(s)  # of days IP prior to Palliative referral  0  Clinical Assessment  Palliative Performance Scale Score  30%  Psychosocial & Spiritual Assessment  Palliative Care Outcomes  Patient/Family meeting held?  Yes  Who was at the meeting?  patient and husband  Palliative Care Outcomes  Clarified goals of care, Counseled regarding hospice      Patient Active Problem List   Diagnosis Date Noted  . Pressure injury of skin 12/04/2015  . Acute encephalopathy 12/04/2015  . DKA, type 2 (HCC) 12/04/2015  . Rhabdomyolysis 12/04/2015  . AKI (acute kidney injury) (HCC)   . Sepsis (HCC)   . Dementia without behavioral disturbance   . Encounter for hospice care discussion   . SIRS (systemic inflammatory response syndrome) (HCC) 12/03/2015  . Altered mental status 12/03/2015  . Acute kidney injury (HCC) 11/20/2015  . Acute renal failure (ARF) (HCC) 11/20/2015  . Moderate dementia without behavioral disturbance 10/30/2015  . Dementia with behavioral disturbance   . Advance care planning   . Goals of care, counseling/discussion   . Palliative care by specialist   . Do not resuscitate discussion   . Peripheral edema   . Dyspnea   . Palliative care encounter   . Edema 10/26/2015  . Chronic systolic heart failure (HCC) 10/26/2015  . Chronic confusion 10/26/2015  . Elevated lactic acid  level 10/26/2015  . 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 Ischemic cardiomyopathy 09/28/2013  . Insulin dependent diabetes mellitus (HCC) 08/10/2012  . Cardiomyopathy, ischemic 06/30/2012  . Chest pain 06/24/2010  . CAD (coronary artery disease) 06/24/2010  . Hypertension 06/24/2010  . Hyperlipidemia 06/24/2010  . Murmur 06/24/2010  . Bruit 06/24/2010    Palliative Care Assessment & Plan   Patient Profile: 75 y.o. female  with past medical history of CAD/CABG, HF (EF 30-35 with severe hypokinesis), dementia, and diabetes who was admitted on 12/03/2015 with sepsis, DKA and AMS.  She was found to have gram positive bacteremia.   [name] has had 3 admissions in the last 6 months.  Her most recent was on 9/27 with acute renal failure.  She was discharged to home with [facility].  Mr. Degrasse feels he is unable to properly care for his wife at home.  Since admission she has been treated with broad-spectrum antibiotics.  Assessment: 75 yo demented female.  MRSA bacteremia.  Not taking POs.  Appears at peace.  Recommendations/Plan:  Residential Hospice - Patient is not appropriate for SNF.   Husband requests [facility].  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  Further family has difficulty with transportation and requests BP.  Husband requests 1 day to process what is happening.    Goals of Care and Additional Recommendations:  Limitations on Scope of Treatment: Full Comfort Care  Code Status:  DNR  Prognosis:   < 2 weeks  Discharge Planning:  Hospice facility  Care plan was discussed with  Husband, Attending MD, and social worker  Thank you for allowing the Palliative Medicine Team to assist in the care of this patient.   Time In: 9:55 Time Out: 10:30 Total Time 35 Prolonged Time Billed no      Greater than 50%  of this time was spent counseling and coordinating care related to  the above assessment and plan.  Algis Downs, PA-C Palliative Medicine   Please contact Palliative MedicineTeam phone at [phone] for questions and concerns.   Please see AMION for individual provider pager numbers.

## 2015-12-06 LAB — CULTURE, BLOOD (ROUTINE X 2)

## 2015-12-07 LAB — URINE CULTURE

## 2015-12-08 LAB — CULTURE, BLOOD (ROUTINE X 2): Culture: NO GROWTH

## 2015-12-11 DIAGNOSIS — R410 Disorientation, unspecified: Secondary | ICD-10-CM

## 2015-12-25 DEATH — deceased

## 2017-09-16 IMAGING — DX DG KNEE COMPLETE 4+V*R*
4 series · 4 of 4 positions shown · non-contrast
Comparison: No recent prior.

CLINICAL DATA: Knee pain.  No reported injury.  Dementia.

EXAM:
RIGHT KNEE - COMPLETE 4+ VIEW

[knee ap]
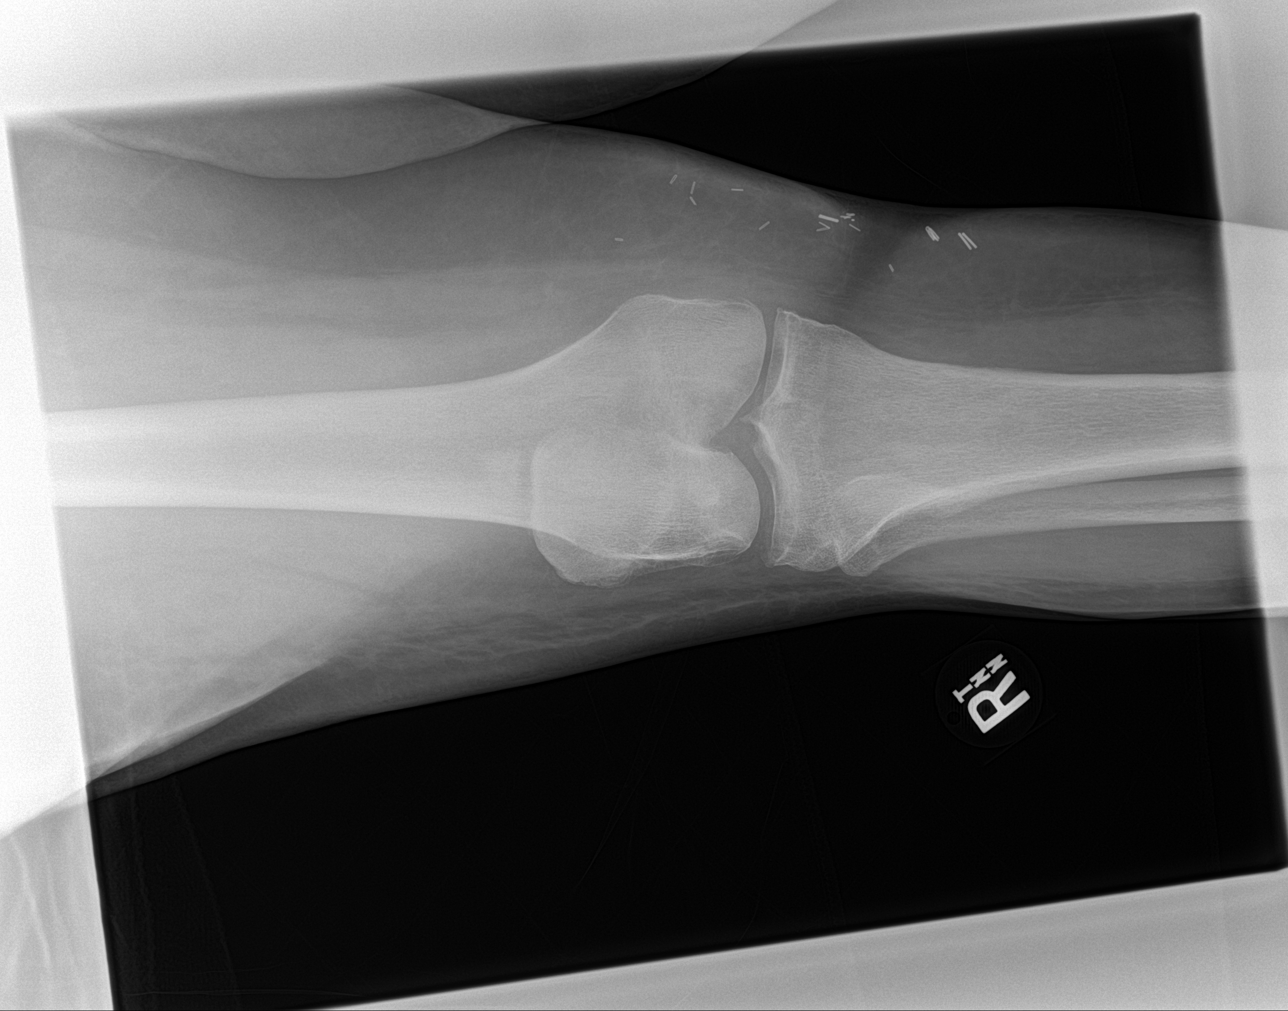

[knee lat]
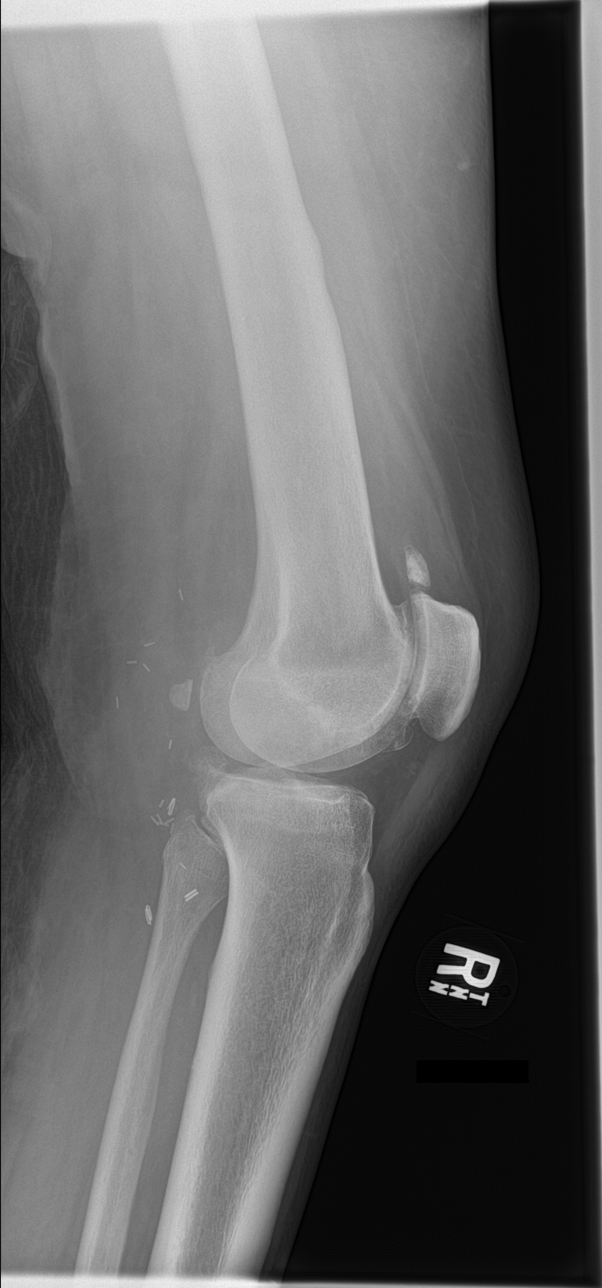

[knee obl (1 of 2)]
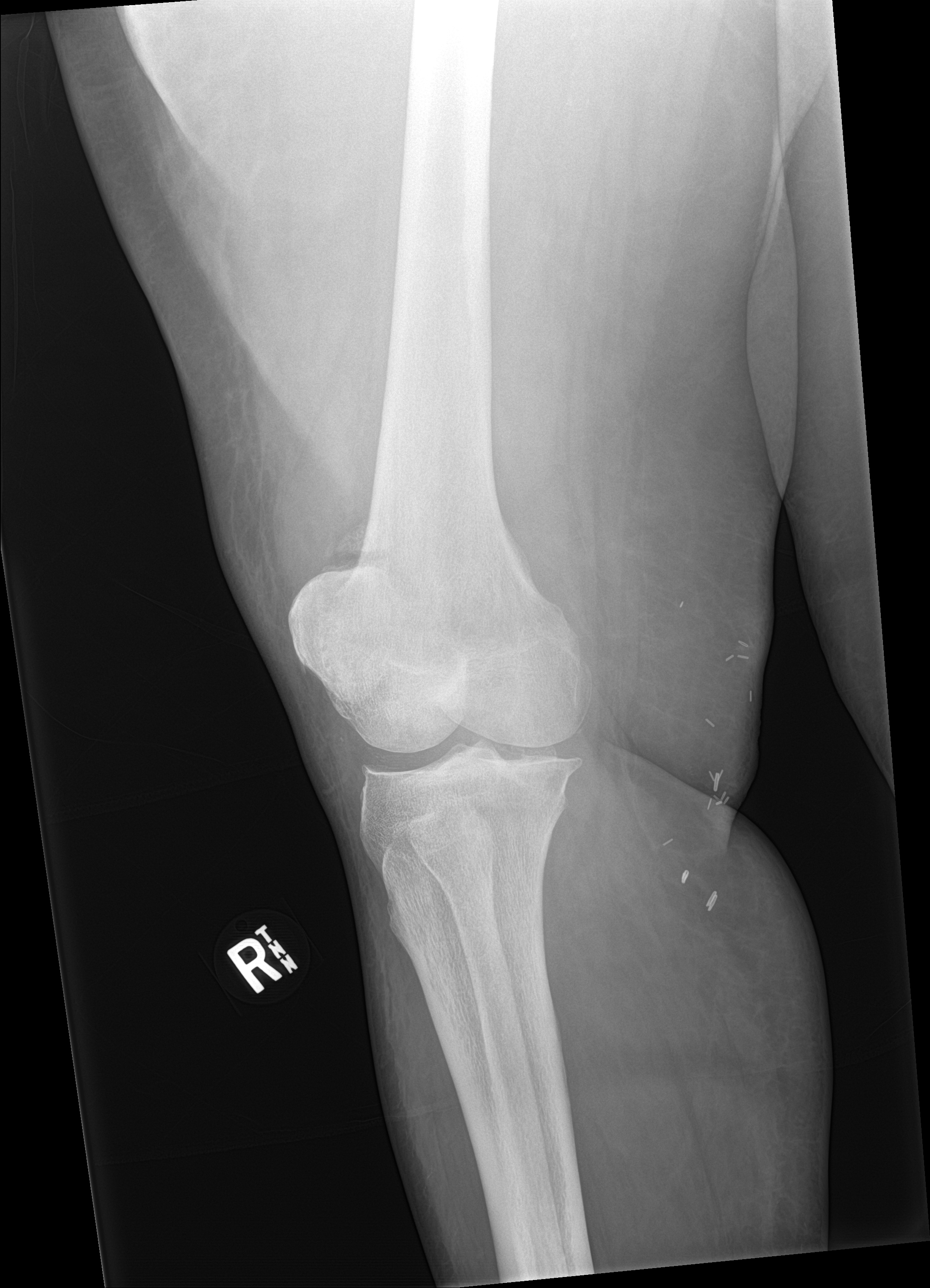

[knee obl (2 of 2)]
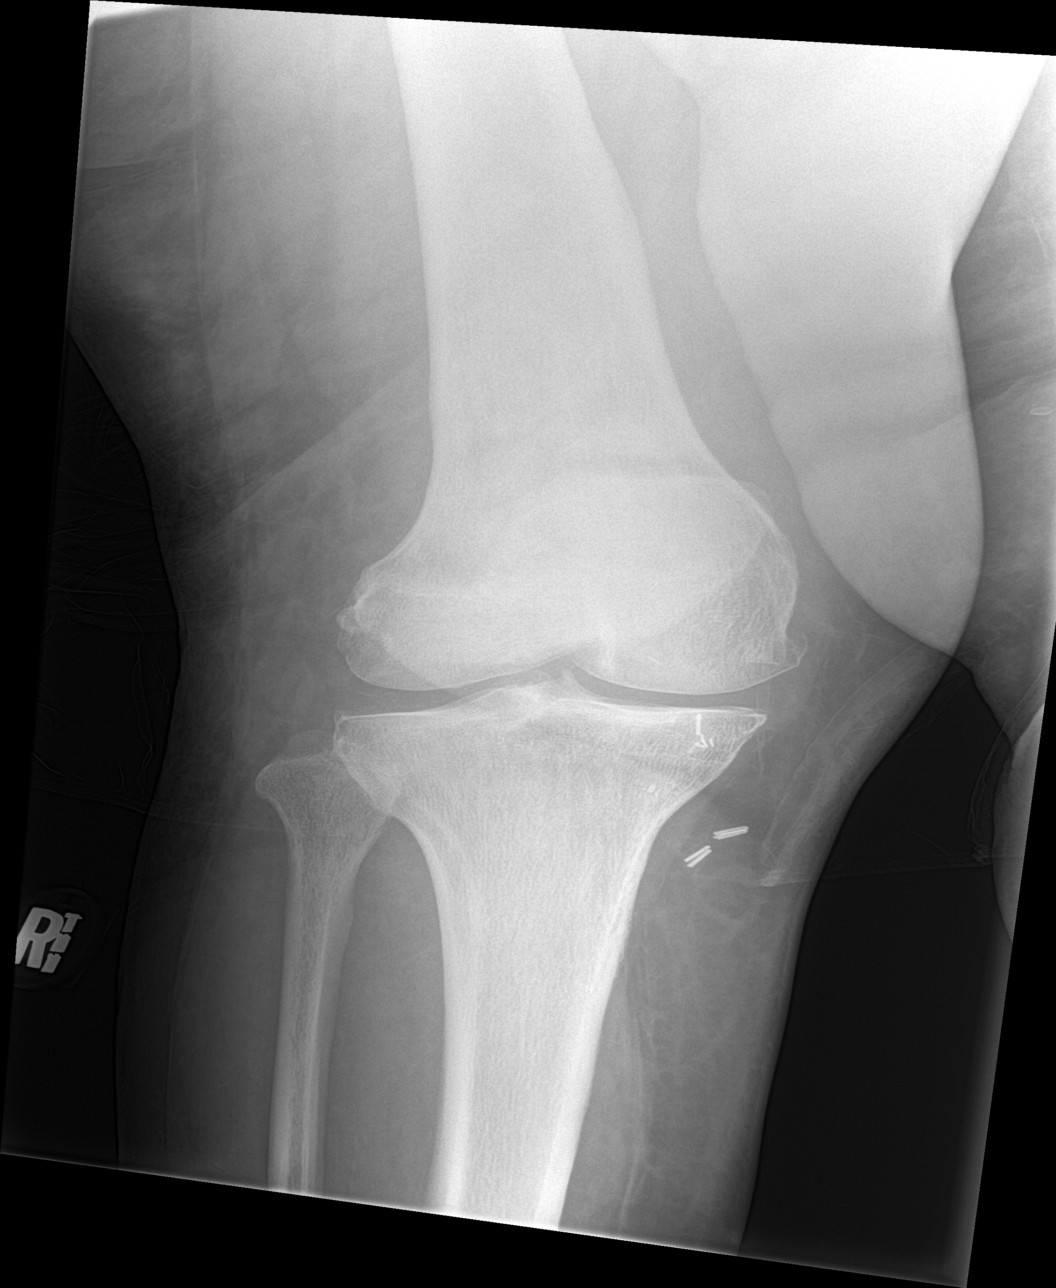

[4 of 4 positions shown; findings below may reference images not displayed]

FINDINGS: Tricompartment degenerative changes. Loose bodies noted. Surgical
clips noted over the soft tissues about the right knee.
IMPRESSION: Degenerative changes right knee with loose bodies. No acute bony
abnormality identified.

## 2017-09-16 IMAGING — DX DG KNEE COMPLETE 4+V*L*
4 series · 4 of 4 positions shown · non-contrast
Comparison: No recent prior.

CLINICAL DATA: Knee pain.  No reported injury .  Dementia.

EXAM:
LEFT KNEE - COMPLETE 4+ VIEW

[knee ap]
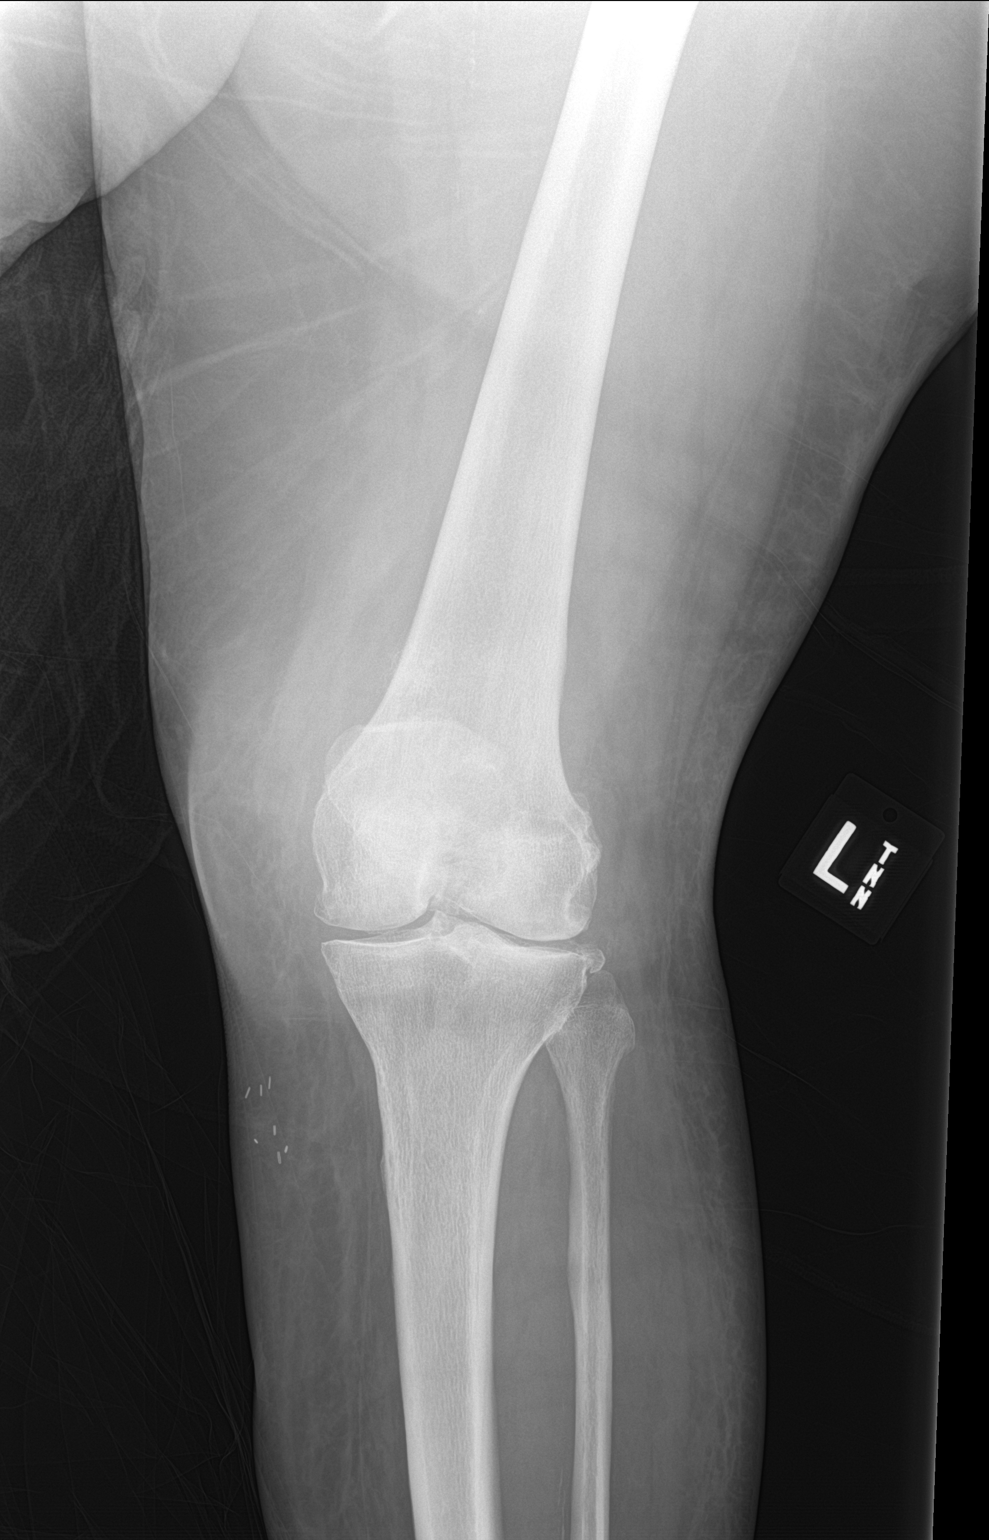

[knee lat]
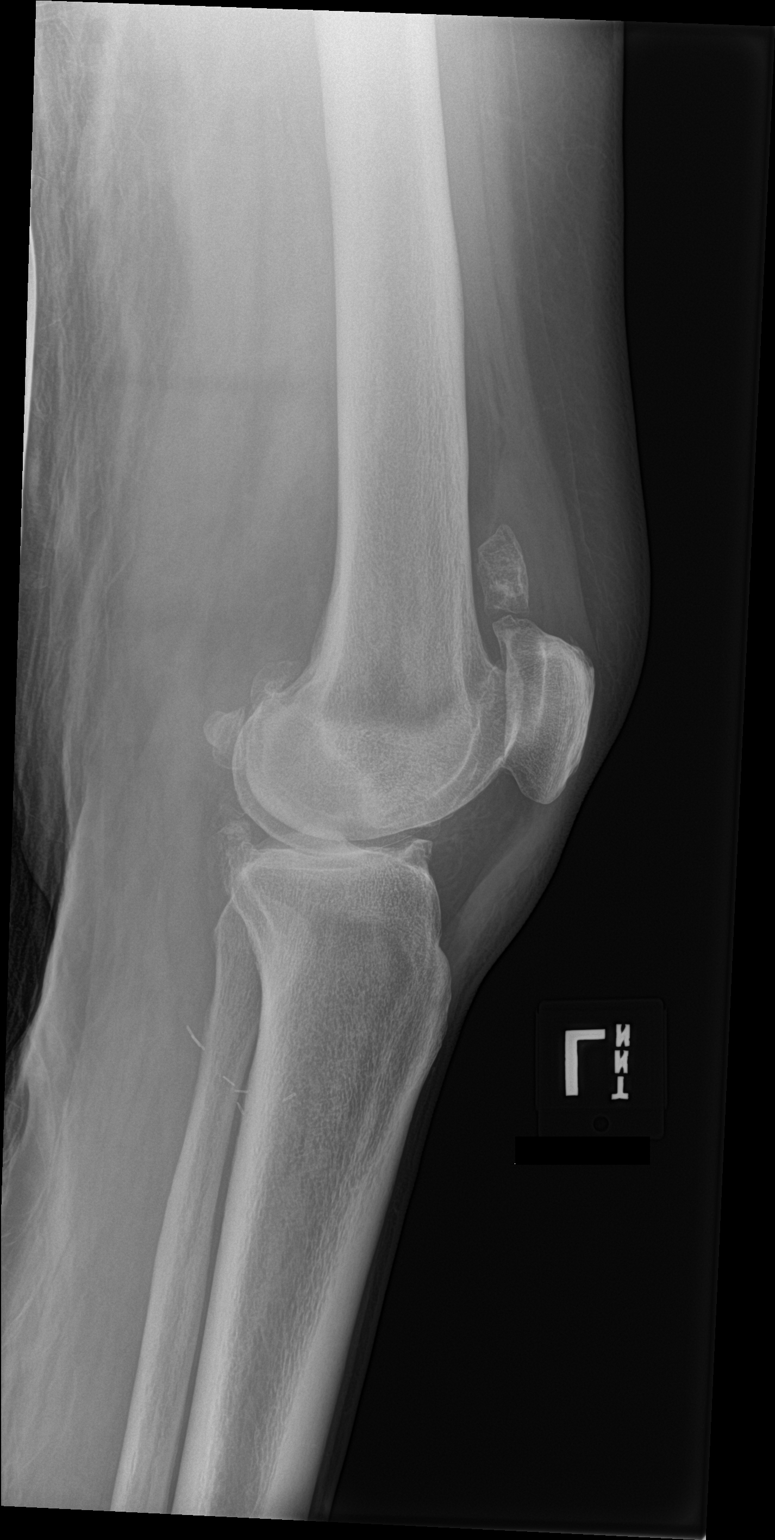

[knee obl (1 of 2)]
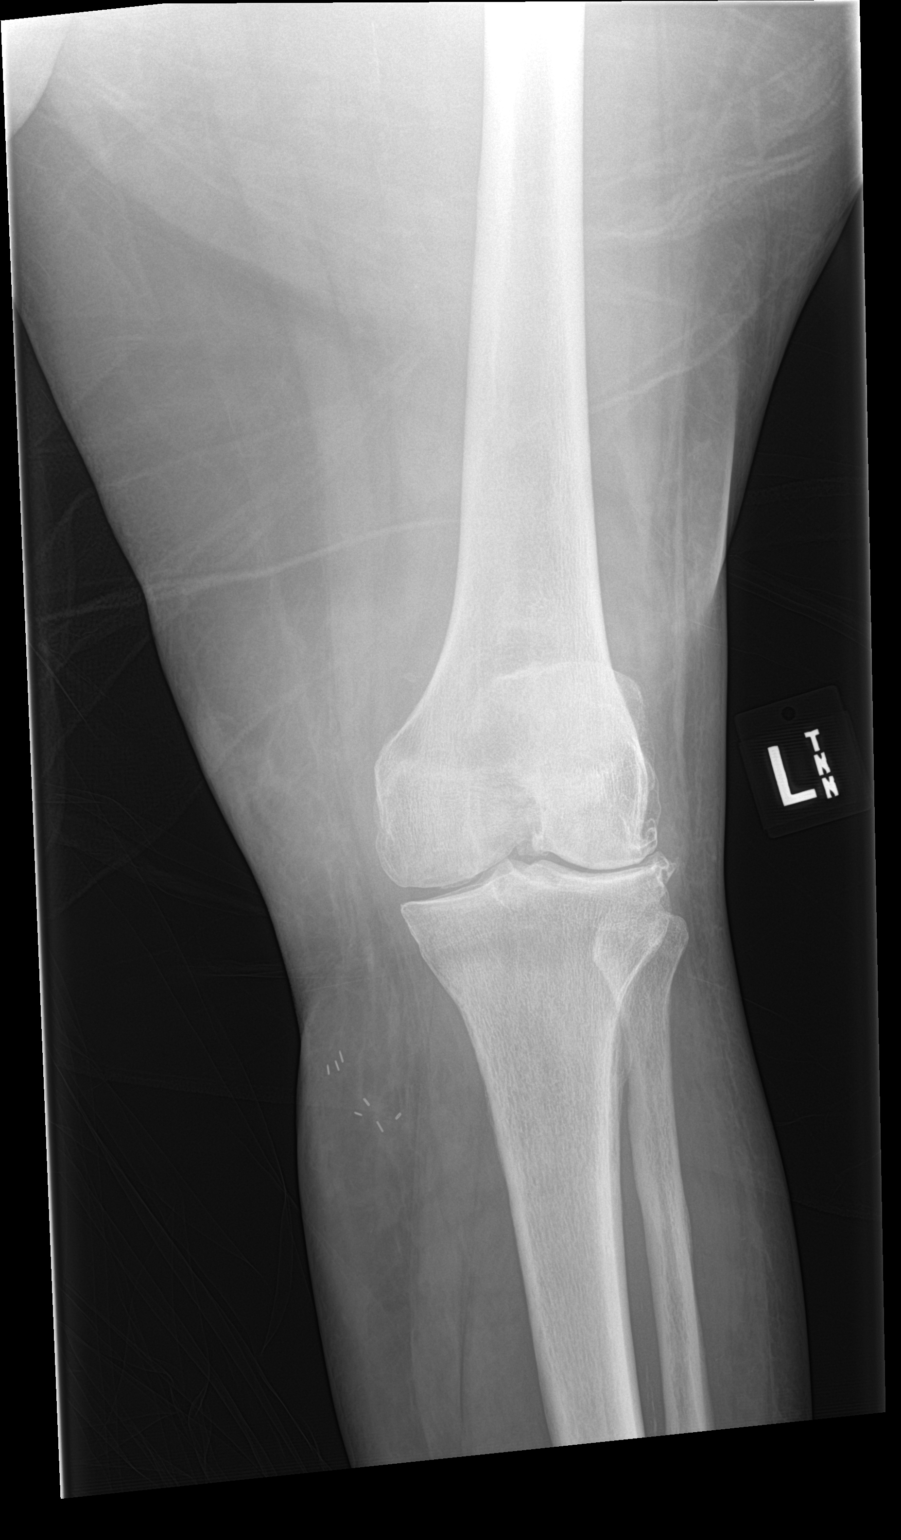

[knee obl (2 of 2)]
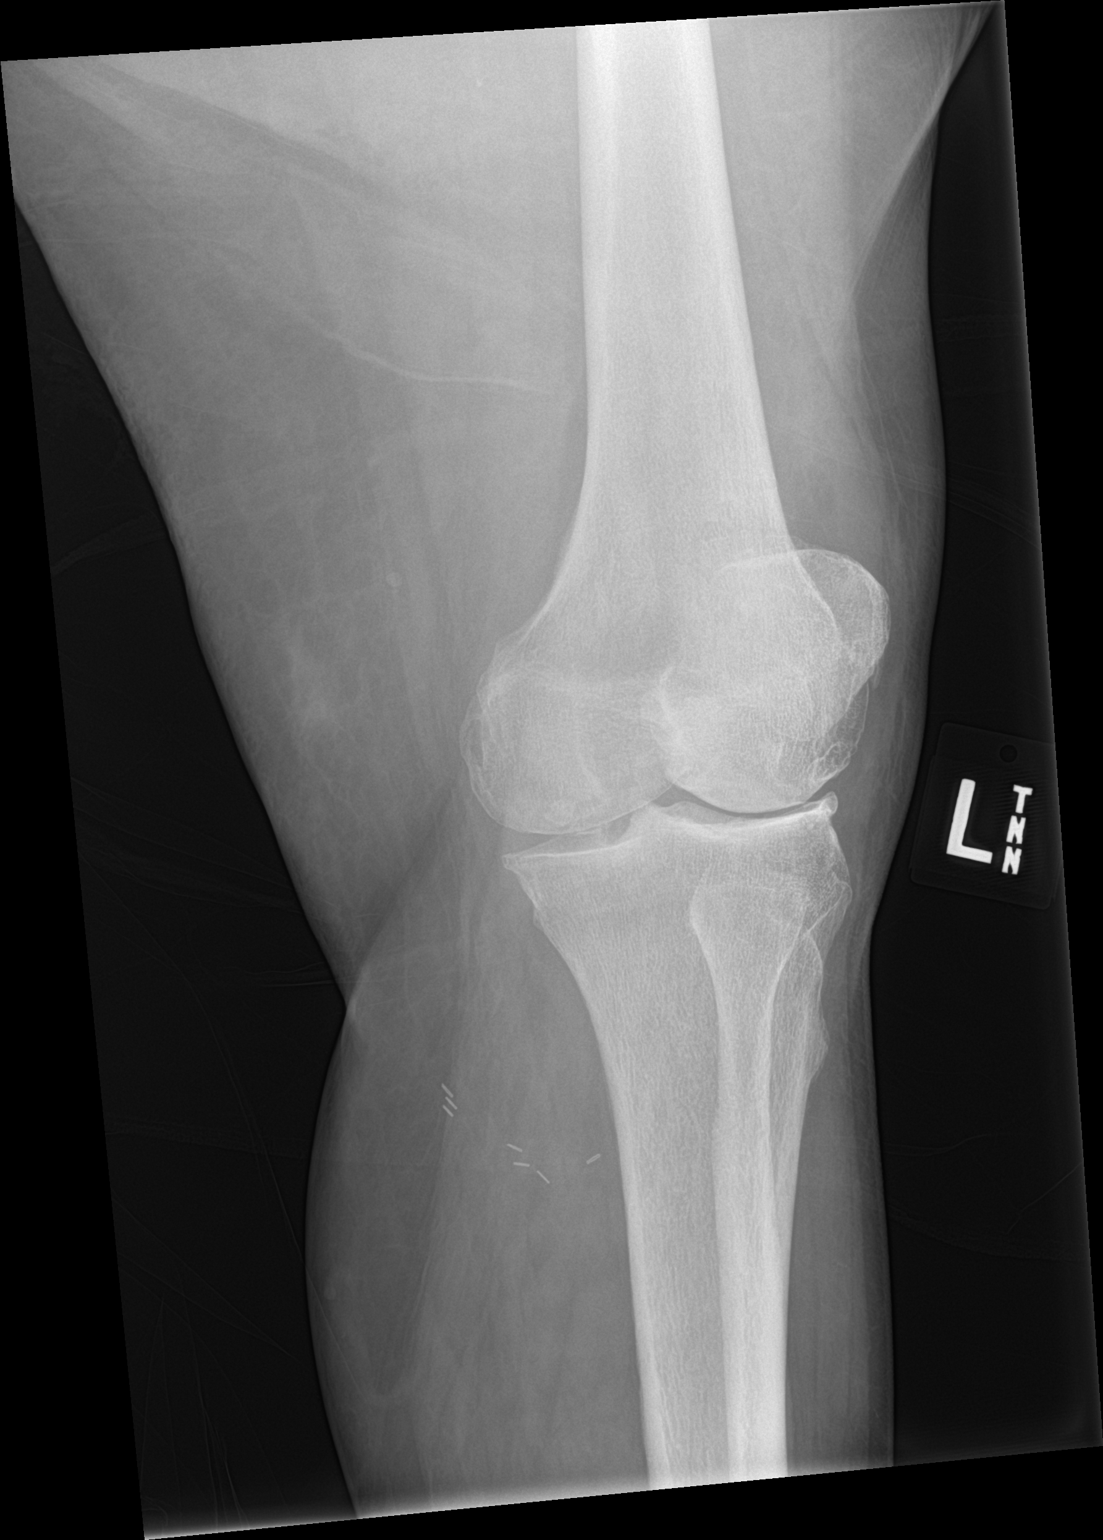

[4 of 4 positions shown; findings below may reference images not displayed]

FINDINGS: Degenerative changes left knee. No acute bony or joint abnormality
identified. No evidence of fracture or dislocation. Multiple
prominent loose bodies are noted. Soft tissue surgical clips over
the lower extremity noted.
IMPRESSION: Severe diffuse degenerative change with multiple loose bodies. No
acute abnormality.
